# Patient Record
Sex: Male | Born: 1955 | Race: Black or African American | Hispanic: No | Marital: Single | Smoking: Current every day smoker
Health system: Southern US, Community
[De-identification: ages and names within clinical notes are randomized; demographics above are authoritative.]

## PROBLEM LIST (undated history)

## (undated) DIAGNOSIS — B182 Chronic viral hepatitis C: Secondary | ICD-10-CM

## (undated) DIAGNOSIS — F1910 Other psychoactive substance abuse, uncomplicated: Secondary | ICD-10-CM

## (undated) DIAGNOSIS — F1011 Alcohol abuse, in remission: Secondary | ICD-10-CM

## (undated) DIAGNOSIS — I10 Essential (primary) hypertension: Secondary | ICD-10-CM

## (undated) DIAGNOSIS — E785 Hyperlipidemia, unspecified: Secondary | ICD-10-CM

## (undated) DIAGNOSIS — F17200 Nicotine dependence, unspecified, uncomplicated: Secondary | ICD-10-CM

## (undated) HISTORY — DX: Nicotine dependence, unspecified, uncomplicated: F17.200

## (undated) HISTORY — DX: Hyperlipidemia, unspecified: E78.5

## (undated) HISTORY — DX: Chronic viral hepatitis C: B18.2

## (undated) HISTORY — PX: POLYPECTOMY: SHX149

## (undated) HISTORY — DX: Alcohol abuse, in remission: F10.11

## (undated) HISTORY — PX: KNEE ARTHROSCOPY: SHX127

## (undated) HISTORY — DX: Other psychoactive substance abuse, uncomplicated: F19.10

## (undated) HISTORY — PX: COLONOSCOPY: SHX174

---

## 1998-07-24 ENCOUNTER — Emergency Department (HOSPITAL_COMMUNITY): Admission: EM | Admit: 1998-07-24 | Discharge: 1998-07-24 | Payer: Self-pay

## 1998-08-07 ENCOUNTER — Encounter: Payer: Self-pay | Admitting: Emergency Medicine

## 1998-08-07 ENCOUNTER — Emergency Department (HOSPITAL_COMMUNITY): Admission: EM | Admit: 1998-08-07 | Discharge: 1998-08-07 | Payer: Self-pay | Admitting: Emergency Medicine

## 1999-12-22 ENCOUNTER — Emergency Department (HOSPITAL_COMMUNITY): Admission: EM | Admit: 1999-12-22 | Discharge: 1999-12-22 | Payer: Self-pay | Admitting: Emergency Medicine

## 1999-12-22 ENCOUNTER — Encounter: Payer: Self-pay | Admitting: Emergency Medicine

## 2001-10-27 ENCOUNTER — Encounter: Payer: Self-pay | Admitting: Emergency Medicine

## 2001-10-27 ENCOUNTER — Emergency Department (HOSPITAL_COMMUNITY): Admission: EM | Admit: 2001-10-27 | Discharge: 2001-10-27 | Payer: Self-pay | Admitting: Emergency Medicine

## 2001-12-20 ENCOUNTER — Encounter: Payer: Self-pay | Admitting: Emergency Medicine

## 2001-12-20 ENCOUNTER — Emergency Department (HOSPITAL_COMMUNITY): Admission: EM | Admit: 2001-12-20 | Discharge: 2001-12-20 | Payer: Self-pay | Admitting: Emergency Medicine

## 2001-12-24 ENCOUNTER — Emergency Department (HOSPITAL_COMMUNITY): Admission: EM | Admit: 2001-12-24 | Discharge: 2001-12-24 | Payer: Self-pay | Admitting: Emergency Medicine

## 2002-02-14 ENCOUNTER — Emergency Department (HOSPITAL_COMMUNITY): Admission: EM | Admit: 2002-02-14 | Discharge: 2002-02-14 | Payer: Self-pay

## 2002-06-01 ENCOUNTER — Emergency Department (HOSPITAL_COMMUNITY): Admission: EM | Admit: 2002-06-01 | Discharge: 2002-06-01 | Payer: Self-pay | Admitting: Emergency Medicine

## 2003-04-28 ENCOUNTER — Emergency Department (HOSPITAL_COMMUNITY): Admission: EM | Admit: 2003-04-28 | Discharge: 2003-04-28 | Payer: Self-pay | Admitting: Emergency Medicine

## 2003-05-30 ENCOUNTER — Emergency Department (HOSPITAL_COMMUNITY): Admission: EM | Admit: 2003-05-30 | Discharge: 2003-05-30 | Payer: Self-pay | Admitting: Emergency Medicine

## 2003-08-02 ENCOUNTER — Emergency Department (HOSPITAL_COMMUNITY): Admission: EM | Admit: 2003-08-02 | Discharge: 2003-08-02 | Payer: Self-pay | Admitting: *Deleted

## 2003-11-26 ENCOUNTER — Emergency Department (HOSPITAL_COMMUNITY): Admission: EM | Admit: 2003-11-26 | Discharge: 2003-11-26 | Payer: Self-pay | Admitting: Emergency Medicine

## 2004-02-16 ENCOUNTER — Emergency Department (HOSPITAL_COMMUNITY): Admission: EM | Admit: 2004-02-16 | Discharge: 2004-02-16 | Payer: Self-pay

## 2005-05-24 ENCOUNTER — Emergency Department (HOSPITAL_COMMUNITY): Admission: EM | Admit: 2005-05-24 | Discharge: 2005-05-24 | Payer: Self-pay | Admitting: Emergency Medicine

## 2006-06-17 ENCOUNTER — Emergency Department (HOSPITAL_COMMUNITY): Admission: EM | Admit: 2006-06-17 | Discharge: 2006-06-17 | Payer: Self-pay | Admitting: Emergency Medicine

## 2007-01-07 ENCOUNTER — Ambulatory Visit: Payer: Self-pay | Admitting: Family Medicine

## 2007-03-26 ENCOUNTER — Ambulatory Visit: Payer: Self-pay | Admitting: Family Medicine

## 2007-04-02 ENCOUNTER — Ambulatory Visit (HOSPITAL_BASED_OUTPATIENT_CLINIC_OR_DEPARTMENT_OTHER): Admission: RE | Admit: 2007-04-02 | Discharge: 2007-04-02 | Payer: Self-pay | Admitting: Urology

## 2007-04-02 ENCOUNTER — Encounter (INDEPENDENT_AMBULATORY_CARE_PROVIDER_SITE_OTHER): Payer: Self-pay | Admitting: Urology

## 2008-04-27 ENCOUNTER — Emergency Department (HOSPITAL_COMMUNITY): Admission: EM | Admit: 2008-04-27 | Discharge: 2008-04-27 | Payer: Self-pay | Admitting: Family Medicine

## 2008-12-13 ENCOUNTER — Emergency Department (HOSPITAL_COMMUNITY): Admission: EM | Admit: 2008-12-13 | Discharge: 2008-12-13 | Payer: Self-pay | Admitting: Family Medicine

## 2009-01-11 ENCOUNTER — Encounter (INDEPENDENT_AMBULATORY_CARE_PROVIDER_SITE_OTHER): Payer: Self-pay | Admitting: Adult Health

## 2009-01-11 ENCOUNTER — Ambulatory Visit: Payer: Self-pay | Admitting: Family Medicine

## 2009-01-11 LAB — CONVERTED CEMR LAB
AST: 49 units/L — ABNORMAL HIGH (ref 0–37)
Albumin: 4.2 g/dL (ref 3.5–5.2)
Alkaline Phosphatase: 63 units/L (ref 39–117)
BUN: 21 mg/dL (ref 6–23)
CO2: 22 meq/L (ref 19–32)
Calcium: 9.4 mg/dL (ref 8.4–10.5)
Chloride: 108 meq/L (ref 96–112)
Sodium: 142 meq/L (ref 135–145)
Total Bilirubin: 0.6 mg/dL (ref 0.3–1.2)
Total Protein: 7.8 g/dL (ref 6.0–8.3)

## 2009-03-04 ENCOUNTER — Emergency Department (HOSPITAL_COMMUNITY): Admission: EM | Admit: 2009-03-04 | Discharge: 2009-03-05 | Payer: Self-pay | Admitting: Emergency Medicine

## 2009-03-14 ENCOUNTER — Ambulatory Visit: Payer: Self-pay | Admitting: Internal Medicine

## 2009-05-16 ENCOUNTER — Encounter (INDEPENDENT_AMBULATORY_CARE_PROVIDER_SITE_OTHER): Payer: Self-pay | Admitting: Adult Health

## 2009-05-16 ENCOUNTER — Ambulatory Visit: Payer: Self-pay | Admitting: Internal Medicine

## 2009-05-16 LAB — CONVERTED CEMR LAB
ALT: 40 units/L (ref 0–53)
Alkaline Phosphatase: 60 units/L (ref 39–117)
BUN: 18 mg/dL (ref 6–23)
CO2: 23 meq/L (ref 19–32)
Calcium: 8.8 mg/dL (ref 8.4–10.5)
Chloride: 105 meq/L (ref 96–112)
Cholesterol: 187 mg/dL (ref 0–200)
Glucose, Bld: 99 mg/dL (ref 70–99)

## 2009-11-20 ENCOUNTER — Emergency Department (HOSPITAL_COMMUNITY): Admission: EM | Admit: 2009-11-20 | Discharge: 2009-11-20 | Payer: Self-pay | Admitting: Emergency Medicine

## 2010-11-20 ENCOUNTER — Encounter (INDEPENDENT_AMBULATORY_CARE_PROVIDER_SITE_OTHER): Payer: Self-pay | Admitting: *Deleted

## 2010-11-20 LAB — CONVERTED CEMR LAB
ALT: 32 units/L (ref 0–53)
AST: 29 units/L (ref 0–37)
Alkaline Phosphatase: 53 units/L (ref 39–117)
Chloride: 104 meq/L (ref 96–112)
Cholesterol: 199 mg/dL (ref 0–200)
Glucose, Bld: 96 mg/dL (ref 70–99)
Sodium: 138 meq/L (ref 135–145)
Total CHOL/HDL Ratio: 2
Total Protein: 7.3 g/dL (ref 6.0–8.3)

## 2011-01-01 ENCOUNTER — Inpatient Hospital Stay (INDEPENDENT_AMBULATORY_CARE_PROVIDER_SITE_OTHER)
Admission: RE | Admit: 2011-01-01 | Discharge: 2011-01-01 | Disposition: A | Discharge status: Home / Self Care | Payer: Medicaid Other | Source: Ambulatory Visit | Attending: Family Medicine | Admitting: Family Medicine

## 2011-01-01 DIAGNOSIS — S335XXA Sprain of ligaments of lumbar spine, initial encounter: Secondary | ICD-10-CM

## 2011-01-11 ENCOUNTER — Inpatient Hospital Stay (INDEPENDENT_AMBULATORY_CARE_PROVIDER_SITE_OTHER)
Admission: RE | Admit: 2011-01-11 | Discharge: 2011-01-11 | Disposition: A | Discharge status: Home / Self Care | Payer: Medicaid Other | Source: Ambulatory Visit | Attending: Family Medicine | Admitting: Family Medicine

## 2011-01-11 DIAGNOSIS — K5900 Constipation, unspecified: Secondary | ICD-10-CM

## 2011-01-11 DIAGNOSIS — K649 Unspecified hemorrhoids: Secondary | ICD-10-CM

## 2011-02-06 NOTE — Op Note (Signed)
NAMEMELROY, BOUGHER                ACCOUNT NO.:  [id number]   MEDICAL RECORD NO.:  [id number]          PATIENT TYPE:  AMB   LOCATION:  [facility]                         FACILITY:  [facility]   PHYSICIAN:  Ronald L. Earlene Plater, M.D.  DATE OF BIRTH:  1956/05/27   DATE OF PROCEDURE:  04/02/2007  DATE OF DISCHARGE:                               OPERATIVE REPORT   DIAGNOSIS:  Balanitis.   OPERATIVE PROCEDURE:  Circumcision.   SURGEON:  Gaynelle Arabian, MD   ANESTHESIA:  LMA.   BLOOD LOSS:  10 mL.   TUBES:  None.   COMPLICATIONS:  None.   INDICATION FOR PROCEDURE:  Roy Martinez is a very nice 55 year old black  male who presents with intermittent balanitis.  He notes at times it is  highly irritating and wishes circumcision.  After understanding risks,  benefits and alternatives, he has elected to proceed.   PROCEDURE IN DETAIL:  The patient was placed in the supine position.  After proper LMA anesthesia, he was prepped with Betadine and draped in  a sterile fashion.  A circumferential incision was made in the shaft  skin at the appropriate level and extended down to Buck's fascia and the  corpus spongiosum and a similar incision was made approximately 2 mm  proximal to the corona __________ and extended to the same levels.  A  dorsal slit was then performed and the tissues were carefully dissected,  utilizing both blunt and sharp dissection and Bovie coagulation cautery  to coagulate the bleeders and the foreskin was submitted for  identification only to Pathology.  Good hemostasis was obtained.  The  area was irrigated and the shaft skin was approximated to the mucosa.  A  U-type stitch was placed with 3-0 chromic catgut in the frenulum area  and a dorsal stitch was placed and running sutures were then performed  between these two.  Good hemostasis was noted to be present in the  wound.  The wound was dressed with Vaseline gauze, 4 x 4 sponge and a  Coban.  The patient tolerated the procedure  well and was taken to the  recovery room in stable.      Ronald L. Earlene Plater, M.D.  Electronically Signed     RLD/MEDQ  D:  04/02/2007  T:  04/03/2007  Job:  [id number]

## 2011-02-09 NOTE — Op Note (Signed)
[facility]  Patient:    Roy Martinez, Roy Martinez Visit Number: [id number] MRN: [id number]          Service Type: EXP Location: ED Attending Physician:  Hanley Seamen Dictated by:   Dominica Severin III, M.D. Proc. Date: 10/27/01 Admit Date:  10/27/2001 Discharge Date: 10/27/2001                             Operative Report  INDICATIONS FOR PROCEDURE:  Valdez Brannan presents today for evaluation in the [facility] in regards to his left thumb. The patient sustained an injury with a wood splitter and sustained an injury to his nail with subungual hematoma and distal P3 fracture about the left thumb. I was asked to see him by the emergency room staff. Tetanus status is up to date. He has been given a gram of Ancef. He denies other injuries.  PAST MEDICAL HISTORY:  None.  PAST SURGICAL HISTORY:  None.  MEDICATIONS:  None.  ALLERGIES:  The patient is allergic to PENICILLIN. He has tolerated Ancef in the emergency room today quite well.  SOCIAL HISTORY:  The patient denies any excessive alcohol use.  PHYSICAL EXAMINATION:  GENERAL:  Black male alert and oriented in no acute distress.  VITAL SIGNS:  Vital signs are stable. He is afebrile.  EXTREMITIES:  He has a subungual hematoma nail laceration and distal P3 fracture noted per his x-ray. EPL and FPL are difficult to evaluate due to pain. He has had additional block which is somewhat incomplete thus his physical examination is difficult. There are no signs of obvious infection in the remaining fingers or other skin changes. X-rays shows a distal phalanx fracture about the left thumb.  IMPRESSION:  Open distal phalanx fracture, left thumb.  PLAN:  I verbally consented for I&D and repair of structures as necessary.  DESCRIPTION OF PROCEDURE:  The patient was given a lidocaine block without epinephrine. He tolerated this well. Following this, the patient underwent thorough scrub and paint  with Hibiclens and alcohol. Following this, the patient had a tourniquet applied, nail was elevated and removed. The patient then underwent I&D of the bone. This included the skin and subcutaneous tissue, bone and nail bed tissue. Adequate I&D was accomplished without difficulty and the patient tolerated this well. Once this was done, the patient then underwent reapproximation of the bone. This was done to my satisfaction without difficulty. After the copious irrigation, I did let the tourniquet down for this to allow for egress of blood. Following this and excellent irrigation, the patient then underwent a 6-0 chromic repair of the nail bed. He tolerated this well without difficulty under 4.0 loop magnification. Once this was done, eponychial gauze was placed under the nail fold. Xeroform was placed, excellent refill was noted, and the patient had a sterile dressing applied about the thumb. A splint was placed. He tolerated this well without difficulty. All sponge, needle and instrument counts were reported as correct and there were no immediate intraoperative complications.  The patient will continue elevation. He was discharged home on Keflex and Vicodin and will return to the office and see [place] in 7 days for a wound check. All questions have been encouraged and answered. Dictated by:   Dominica Severin III, M.D. Attending Physician:  Hanley Seamen DD:  10/27/01 TD:  10/28/01 Job: [id number]

## 2011-02-25 ENCOUNTER — Emergency Department (HOSPITAL_COMMUNITY)
Admission: EM | Admit: 2011-02-25 | Discharge: 2011-02-26 | Disposition: A | Discharge status: Home / Self Care | Payer: Medicare Other | Attending: Emergency Medicine | Admitting: Emergency Medicine

## 2011-02-25 ENCOUNTER — Emergency Department (HOSPITAL_COMMUNITY): Payer: Medicare Other

## 2011-02-25 DIAGNOSIS — Y93H9 Activity, other involving exterior property and land maintenance, building and construction: Secondary | ICD-10-CM | POA: Insufficient documentation

## 2011-02-25 DIAGNOSIS — Z23 Encounter for immunization: Secondary | ICD-10-CM | POA: Insufficient documentation

## 2011-02-25 DIAGNOSIS — S92919B Unspecified fracture of unspecified toe(s), initial encounter for open fracture: Secondary | ICD-10-CM | POA: Insufficient documentation

## 2011-02-25 DIAGNOSIS — W309XXA Contact with unspecified agricultural machinery, initial encounter: Secondary | ICD-10-CM | POA: Insufficient documentation

## 2011-07-10 LAB — POCT HEMOGLOBIN-HEMACUE: Hemoglobin: 15.3

## 2011-08-14 ENCOUNTER — Emergency Department (HOSPITAL_COMMUNITY)
Admission: EM | Admit: 2011-08-14 | Discharge: 2011-08-14 | Disposition: A | Discharge status: Home / Self Care | Payer: Medicare Other | Attending: Emergency Medicine | Admitting: Emergency Medicine

## 2011-08-14 DIAGNOSIS — IMO0002 Reserved for concepts with insufficient information to code with codable children: Secondary | ICD-10-CM | POA: Insufficient documentation

## 2011-08-14 DIAGNOSIS — F17200 Nicotine dependence, unspecified, uncomplicated: Secondary | ICD-10-CM | POA: Insufficient documentation

## 2011-08-14 DIAGNOSIS — M549 Dorsalgia, unspecified: Secondary | ICD-10-CM | POA: Insufficient documentation

## 2011-08-14 DIAGNOSIS — X58XXXA Exposure to other specified factors, initial encounter: Secondary | ICD-10-CM | POA: Insufficient documentation

## 2011-08-14 DIAGNOSIS — S39012A Strain of muscle, fascia and tendon of lower back, initial encounter: Secondary | ICD-10-CM

## 2011-08-14 MED ORDER — IBUPROFEN 800 MG PO TABS: 800.0000 mg | ORAL_TABLET | Freq: Three times a day (TID) | ORAL | Status: AC

## 2011-08-14 MED ORDER — OXYCODONE-ACETAMINOPHEN 5-325 MG PO TABS: 1.0000 | ORAL_TABLET | Freq: Four times a day (QID) | ORAL | Status: AC | PRN

## 2011-08-14 MED ORDER — KETOROLAC TROMETHAMINE 60 MG/2ML IM SOLN
60.0000 mg | Freq: Once | INTRAMUSCULAR | Status: AC
  Administered 2011-08-14: 60 mg via INTRAMUSCULAR
  Filled 2011-08-14: qty 2

## 2011-08-14 NOTE — ED Provider Notes (Signed)
History     CSN: [id number] Arrival date & time: 08/14/2011  7:10 PM   First MD Initiated Contact with Patient 08/14/11 2028      Chief Complaint  Patient presents with  . Back Pain    (Consider location/radiation/quality/duration/timing/severity/associated sxs/prior treatment) The history is provided by the patient.    Pt states that he slept on a big ball of his comforter last night and woke up with a sore back, then he was washing his car this evening and when he bent down then stood up, his back hurt even worse. He states it is almost gone when he lies still, and worsens with moving and standing still. He does not have a history of back problems. Pt is able to ambulate. No V/D/F or chills  History reviewed. No pertinent past medical history.  Past Surgical History  Procedure Date  . Knee arthroscopy     No family history on file.  History  Substance Use Topics  . Smoking status: Current Everyday Smoker -- 1.0 packs/day  . Smokeless tobacco: Not on file  . Alcohol Use: Yes      Review of Systems  All other systems reviewed and are negative.    Allergies  Penicillins  Home Medications   Current Outpatient Rx  Name Route Sig Dispense Refill  . IBUPROFEN 200 MG PO TABS Oral Take 400 mg by mouth daily as needed.        BP 145/86  Pulse 59  Temp(Src) 98.6 F (37 C) (Oral)  Resp 20  SpO2 99%  Physical Exam  Nursing note and vitals reviewed. Constitutional: He is oriented to person, place, and time. He appears well-developed and well-nourished.  HENT:  Head: Normocephalic and atraumatic.  Eyes: EOM are normal. Pupils are equal, round, and reactive to light.  Neck: Normal range of motion.  Cardiovascular: Normal rate and regular rhythm.   Pulmonary/Chest: Effort normal and breath sounds normal.  Musculoskeletal: Normal range of motion.       Arms: Neurological: He is alert and oriented to person, place, and time.  Skin: Skin is warm and dry.    ED  Course  Procedures (including critical care time)   Labs Reviewed  URINALYSIS, ROUTINE W REFLEX MICROSCOPIC   No results found.   No diagnosis found.    MDM          Dorthula Matas, PA 08/14/11 2037

## 2011-08-14 NOTE — ED Notes (Signed)
Pt presents with sudden onset of L sided back pain after awakening this morning.  Pt was washing his car with pain worsening.  Pt describes "like something running around in my back".  Pt denies any shortness of breath.  Pt reports pain worsens with movement.

## 2011-08-14 NOTE — ED Notes (Signed)
Pt is going to the bathroom to void

## 2011-08-15 NOTE — ED Provider Notes (Signed)
Medical screening examination/treatment/procedure(s) were performed by non-physician practitioner and as supervising physician I was immediately available for consultation/collaboration.    Nelia Shi, MD 08/15/11 [phone]

## 2011-10-05 ENCOUNTER — Emergency Department (HOSPITAL_COMMUNITY)
Admission: EM | Admit: 2011-10-05 | Discharge: 2011-10-05 | Disposition: A | Discharge status: Home / Self Care | Payer: Medicare Other | Attending: Emergency Medicine | Admitting: Emergency Medicine

## 2011-10-05 ENCOUNTER — Encounter (HOSPITAL_COMMUNITY): Payer: Self-pay

## 2011-10-05 DIAGNOSIS — F17200 Nicotine dependence, unspecified, uncomplicated: Secondary | ICD-10-CM | POA: Diagnosis not present

## 2011-10-05 DIAGNOSIS — M546 Pain in thoracic spine: Secondary | ICD-10-CM | POA: Diagnosis not present

## 2011-10-05 DIAGNOSIS — M549 Dorsalgia, unspecified: Secondary | ICD-10-CM | POA: Insufficient documentation

## 2011-10-05 MED ORDER — HYDROCODONE-ACETAMINOPHEN 5-325 MG PO TABS: ORAL_TABLET | ORAL | Status: AC

## 2011-10-05 MED ORDER — METHOCARBAMOL 500 MG PO TABS: 1000.0000 mg | ORAL_TABLET | Freq: Four times a day (QID) | ORAL | Status: AC

## 2011-10-05 MED ORDER — NAPROXEN 500 MG PO TABS: 500.0000 mg | ORAL_TABLET | Freq: Two times a day (BID) | ORAL | Status: DC

## 2011-10-05 NOTE — ED Notes (Signed)
Patent reports that he was doing yard work 2 days ago and feels like he pulled something in his back. Patient c/o upper back pain between shoulder blades.

## 2011-10-05 NOTE — ED Provider Notes (Signed)
History/physical exam/procedure(s) were performed by non-physician practitioner and as supervising physician I was immediately available for consultation/collaboration. I have reviewed all notes and am in agreement with care and plan.   Hilario Quarry, MD 10/05/11 [phone]

## 2011-10-05 NOTE — ED Provider Notes (Signed)
History     CSN: [id number]  Arrival date & time 10/05/11  9892   First MD Initiated Contact with Patient 10/05/11 1018      Chief Complaint  Patient presents with  . Back Pain    (Consider location/radiation/quality/duration/timing/severity/associated sxs/prior treatment) HPI Comments: Patient presents to ED complaining of mid back pain.  The patient does not seem to remember a specific mechanism of injury but states he thinks he hurt it 2 days ago while moving a pressure washer.  Patient reports 8/10 pn scale.  Denies chest pain, neck pain, low back pain, numbness, tingling, and weakness.  Prev hx of LBP but has never injured thoracic spine/shoulders/neck. No red flag s/s of back pain.   Patient is a 56 y.o. male presenting with back pain. The history is provided by the patient.  Back Pain  This is a new problem. The current episode started 2 days ago. The problem occurs constantly. The problem has not changed since onset.The pain is associated with no known injury. The pain is present in the thoracic spine. The quality of the pain is described as aching. The pain does not radiate. The patient is experiencing no pain. Pertinent negatives include no chest pain, no fever, no numbness, no headaches, no bowel incontinence, no bladder incontinence, no paresthesias and no weakness. He has tried nothing for the symptoms.    History reviewed. No pertinent past medical history.  Past Surgical History  Procedure Date  . Knee arthroscopy     Family History  Problem Relation Age of Onset  . Heart failure Mother   . Heart failure Father     History  Substance Use Topics  . Smoking status: Current Everyday Smoker -- 0.5 packs/day  . Smokeless tobacco: Not on file  . Alcohol Use: No      Review of Systems  Constitutional: Negative for fever, fatigue and unexpected weight change.  HENT: Negative for neck pain and neck stiffness.   Cardiovascular: Negative for chest pain, palpitations  and leg swelling.  Gastrointestinal: Negative for nausea, vomiting, constipation and bowel incontinence.       Neg for fecal incontinence  Genitourinary: Negative for bladder incontinence, flank pain and difficulty urinating.  Musculoskeletal: Positive for back pain.  Skin: Negative for color change.  Neurological: Negative for syncope, weakness, numbness, headaches and paresthesias.       Neg for numbness, tingling, or weakness in extremities. Neg for saddle paresthesias.     Allergies  Penicillins  Home Medications  No current outpatient prescriptions on file.  BP 124/79  Pulse 57  Temp(Src) 97.7 F (36.5 C) (Oral)  Resp 18  SpO2 99%  Physical Exam  Nursing note and vitals reviewed. Constitutional: He is oriented to person, place, and time. He appears well-developed and well-nourished.  HENT:  Head: Normocephalic and atraumatic.  Eyes: Conjunctivae are normal.  Neck: Normal range of motion.  Cardiovascular: Normal rate, normal heart sounds and intact distal pulses.   Pulmonary/Chest: Effort normal and breath sounds normal.  Abdominal: Soft. There is no tenderness.  Musculoskeletal: Normal range of motion. He exhibits no tenderness.       Arms:      Neck ROM is limited with R lateral flexion. Thoracic paraspinal tenderness.  Full shoulder ROM with mild pain with shoulder extension.  Shoulder strength is 5/5.  No step off with palpation of spine.   Neurological: He is alert and oriented to person, place, and time. He has normal reflexes. He exhibits normal muscle  tone.       5/5 strength in entire lower extremities bilaterally. No sensation deficit.   Skin: Skin is warm and dry.  Psychiatric: He has a normal mood and affect.    ED Course  Procedures (including critical care time)  Labs Reviewed - No data to display No results found.   1. Back pain    11:39 AM Patient seen and examined.  Patient was counseled on back pain precautions and told to do activity as  tolerated but do not lift, push, or pull heavy objects more than 10 pounds.  Patient counseled to use ice or heat on back for no longer than 15 minutes every hour.  Questions answered.  Patient verbalized understanding.    11:39 AM Patient counseled on use of narcotic pain medications. Counseled not to combine these medications with others containing tylenol. Urged not to drink alcohol, drive, or perform any other activities that requires focus while taking these medications. The patient verbalizes understanding and agrees with the plan.  11:39 AM Patient counseled on proper use of muscle relaxant medication.  They were told not to drink alcohol, drive any vehicle, or do any dangerous activities while taking this medication.  Patient verbalized understanding.    MDM  Patient with back pain, 3 days, possible injury.  No neurological deficits and normal neuro exam. No loss of bowel or bladder control.  No concern for cauda equina.  No fever, night sweats, weight loss, h/o cancer, IVDU.  RICE protocol and pain medicine indicated.          Carolee Rota, [place] 10/05/11 1139

## 2012-04-03 ENCOUNTER — Encounter (HOSPITAL_COMMUNITY): Payer: Self-pay | Admitting: *Deleted

## 2012-04-03 ENCOUNTER — Emergency Department (HOSPITAL_COMMUNITY)
Admission: EM | Admit: 2012-04-03 | Discharge: 2012-04-03 | Disposition: A | Discharge status: Home / Self Care | Payer: Medicare Other | Attending: Emergency Medicine | Admitting: Emergency Medicine

## 2012-04-03 DIAGNOSIS — D179 Benign lipomatous neoplasm, unspecified: Secondary | ICD-10-CM | POA: Diagnosis not present

## 2012-04-03 DIAGNOSIS — S39012A Strain of muscle, fascia and tendon of lower back, initial encounter: Secondary | ICD-10-CM

## 2012-04-03 DIAGNOSIS — S335XXA Sprain of ligaments of lumbar spine, initial encounter: Secondary | ICD-10-CM | POA: Diagnosis not present

## 2012-04-03 DIAGNOSIS — X58XXXA Exposure to other specified factors, initial encounter: Secondary | ICD-10-CM | POA: Insufficient documentation

## 2012-04-03 MED ORDER — HYDROCODONE-ACETAMINOPHEN 5-500 MG PO TABS: 1.0000 | ORAL_TABLET | Freq: Four times a day (QID) | ORAL | Status: AC | PRN

## 2012-04-03 MED ORDER — NAPROXEN 500 MG PO TABS: 500.0000 mg | ORAL_TABLET | Freq: Two times a day (BID) | ORAL | Status: DC

## 2012-04-03 MED ORDER — CYCLOBENZAPRINE HCL 10 MG PO TABS: 10.0000 mg | ORAL_TABLET | Freq: Two times a day (BID) | ORAL | Status: AC | PRN

## 2012-04-03 NOTE — ED Provider Notes (Signed)
Medical screening examination/treatment/procedure(s) were performed by non-physician practitioner and as supervising physician I was immediately available for consultation/collaboration.  Shron Ozer K Linker, MD 04/03/12 1611 

## 2012-04-03 NOTE — ED Provider Notes (Signed)
History     CSN: [id number]  Arrival date & time 04/03/12  1139   First MD Initiated Contact with Patient 04/03/12 1216      Chief Complaint  Patient presents with  . Back Pain    (Consider location/radiation/quality/duration/timing/severity/associated sxs/prior treatment) Patient is a 56 y.o. male presenting with back pain. The history is provided by the patient.  Back Pain  This is a recurrent problem. The current episode started yesterday. Pertinent negatives include no fever, no headaches and no abdominal pain.  Pt states he lifted a pressure washer yesterday. States since then pain to the left lower back. No midline pain. No radiation down legs. No weakness or numbness in legs. No abdominal pain. No fever. No urinary symptoms or hematuria. No problems with bowels. States took ibuprofen with no relief. States similar symptoms in the past. Pt also reports that he has "bump" on his forehead that has been there several months but is getting larger. No pain or redness. No injury to the head.  History reviewed. No pertinent past medical history.  Past Surgical History  Procedure Date  . Knee arthroscopy     Family History  Problem Relation Age of Onset  . Heart failure Mother   . Heart failure Father     History  Substance Use Topics  . Smoking status: Current Everyday Smoker -- 0.5 packs/day  . Smokeless tobacco: Not on file  . Alcohol Use: Yes     2 quarts beer everyday      Review of Systems  Constitutional: Negative for fever and chills.  HENT: Negative for neck pain and neck stiffness.   Respiratory: Negative.   Cardiovascular: Negative.   Gastrointestinal: Negative for nausea, vomiting, abdominal pain, diarrhea and constipation.  Genitourinary: Negative for urgency, frequency, hematuria, flank pain, difficulty urinating and testicular pain.  Musculoskeletal: Positive for back pain. Negative for gait problem.  Skin: Positive for wound.  Neurological: Negative  for dizziness and headaches.    Allergies  Penicillins  Home Medications  No current outpatient prescriptions on file.  BP 129/82  Pulse 56  Temp 97.9 F (36.6 C) (Oral)  Resp 14  SpO2 99%  Physical Exam  Nursing note and vitals reviewed. Constitutional: He is oriented to person, place, and time. He appears well-developed and well-nourished. No distress.  HENT:       Cystic, soft, about 2cm mass to the right forehead, and another one to the left temple, slightly larger, about 4cm. Non tender. Non erythemous.   Eyes: Conjunctivae are normal.  Neck: Neck supple.  Cardiovascular: Normal rate, regular rhythm and normal heart sounds.   Pulmonary/Chest: Effort normal and breath sounds normal. No respiratory distress. He has no wheezes. He has no rales.  Abdominal: Soft. Bowel sounds are normal. He exhibits no distension. There is no tenderness. There is no rebound.  Musculoskeletal:       No midline spine tenderness. No tenderness over lumbar area. Pain with forward flexion in the left lower back. No pain with straight leg raise  Neurological: He is alert and oriented to person, place, and time.       5/5 and equal strength of LE bilaterally. Normal sensation over thighs and lowe legs. Pt able to dorsiflex bilateral feet and toes. Pt's left knee deformed from prior surgeries, no patellar reflex attempted. Normal achillis reflexes.  Skin: Skin is warm and dry.  Psychiatric: He has a normal mood and affect.    ED Course  Procedures (including critical care  time)  Pt with musculoskeletal back pain, after lifitng heavy pressure washer. He is neurovascularly intact. No concern for caudea equina, no red flags. No pain radiation, no weakness of lower extremities. No abdominal pain. Afebrile. Will d/c home with pain medications and follow up with orthopedics.   1. Lumbar strain   2. Lipoma       MDM         Lottie Mussel, PA 04/03/12 1556

## 2012-04-03 NOTE — ED Notes (Signed)
Pt reports low back pain since last night. Sts "could be" when asked if pain is related to lifting or straining back. Ibuprofen without relief at home. Also reports a "knot" on forehead that has been growing since last week. Not painful, denies redness/drainage.

## 2012-06-11 ENCOUNTER — Emergency Department (HOSPITAL_COMMUNITY)
Admission: EM | Admit: 2012-06-11 | Discharge: 2012-06-11 | Disposition: A | Discharge status: Home / Self Care | Payer: Medicare Other | Attending: Emergency Medicine | Admitting: Emergency Medicine

## 2012-06-11 ENCOUNTER — Emergency Department (HOSPITAL_COMMUNITY): Payer: Medicare Other

## 2012-06-11 ENCOUNTER — Encounter (HOSPITAL_COMMUNITY): Payer: Self-pay | Admitting: Emergency Medicine

## 2012-06-11 DIAGNOSIS — Z8249 Family history of ischemic heart disease and other diseases of the circulatory system: Secondary | ICD-10-CM | POA: Insufficient documentation

## 2012-06-11 DIAGNOSIS — M47812 Spondylosis without myelopathy or radiculopathy, cervical region: Secondary | ICD-10-CM | POA: Diagnosis not present

## 2012-06-11 DIAGNOSIS — F17200 Nicotine dependence, unspecified, uncomplicated: Secondary | ICD-10-CM | POA: Insufficient documentation

## 2012-06-11 DIAGNOSIS — Z880 Allergy status to penicillin: Secondary | ICD-10-CM | POA: Insufficient documentation

## 2012-06-11 MED ORDER — OXYCODONE-ACETAMINOPHEN 5-325 MG PO TABS: 2.0000 | ORAL_TABLET | ORAL | Status: DC | PRN

## 2012-06-11 MED ORDER — KETOROLAC TROMETHAMINE 60 MG/2ML IM SOLN
60.0000 mg | Freq: Once | INTRAMUSCULAR | Status: AC
  Administered 2012-06-11: 60 mg via INTRAMUSCULAR
  Filled 2012-06-11: qty 2

## 2012-06-11 NOTE — ED Notes (Signed)
Patient transported to X-ray 

## 2012-06-11 NOTE — ED Provider Notes (Signed)
History     CSN: [id number]  Arrival date & time 06/11/12  1358   First MD Initiated Contact with Patient 06/11/12 1658      Chief Complaint  Patient presents with  . 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 Pain feels like stiffness and does not radiate. Gradual onset. No medication for pain relief at home. Limits ROM of neck. Patient denies NVD, fever, visual changes, headache, numbness/tinlging, bowel/bladder incontinence.   Patient is a 56 y.o. male presenting with neck pain.  Neck Pain     History reviewed. No pertinent past medical history.  Past Surgical History  Procedure Date  . Knee arthroscopy     Family History  Problem Relation Age of Onset  . Heart failure Mother   . Heart failure Father     History  Substance Use Topics  . Smoking status: Current Every Day Smoker -- 0.5 packs/day    Types: Cigarettes  . Smokeless tobacco: Not on file  . Alcohol Use: Yes     2 quarts beer everyday      Review of Systems  HENT: Positive for neck pain.   All other systems reviewed and are negative.    Allergies  Penicillins  Home Medications  No current outpatient prescriptions on file.  BP 143/80  Pulse 59  Temp 98.6 F (37 C) (Oral)  Resp 20  SpO2 98%  Physical Exam  Nursing note and vitals reviewed. Constitutional: He is oriented to person, place, and time. He appears well-developed and well-nourished. No distress.  HENT:  Head: Normocephalic and atraumatic.  Mouth/Throat: No oropharyngeal exudate.  Eyes: Conjunctivae normal and EOM are normal. No scleral icterus.  Neck:       Neck stiff and limited ROM due to pain. No obvious deformity.   Cardiovascular: Normal rate and regular rhythm.  Exam reveals no gallop and no friction rub.   No murmur heard. Pulmonary/Chest:  Effort normal and breath sounds normal. No respiratory distress. He has no wheezes. He has no rales. He exhibits no tenderness.  Abdominal: Soft. There is no tenderness.  Musculoskeletal: Normal range of motion.       Neck tender to palpation laterally of spine. Neck ROM limited due to pain.   Neurological: He is alert and oriented to person, place, and time. No cranial nerve deficit. Coordination normal.       Strength and sensation equal and intact bilaterally. No meningeal signs. Normal gait. No midline spinal tenderness or step off.   Skin: Skin is warm and dry. He is not diaphoretic.  Psychiatric: He has a normal mood and affect. His behavior is normal.    ED Course  Procedures (including critical care time)  Labs Reviewed - No data to display Dg Cervical Spine Complete  06/11/2012  *RADIOLOGY REPORT*  Clinical Data: Diffuse neck pain over the last day.  CERVICAL SPINE - COMPLETE 4+ VIEW  Comparison: Cervical spine radiographs 03/04/2009.  Findings: The cervical spine is visualized from skull base through C7 on the lateral view.  Alignment is grossly anatomic at the cervicothoracic junction.  Bone detail somewhat limited by overlying structures.  Uncovertebral disease is evident bilaterally, most prominent os at to C5-6 and C6-7.  No acute fracture or traumatic subluxation is present.  The mild osseous foraminal narrowing is present on the left at to C7-T1 and on the right at C5-6.  The  lung apices are clear.  IMPRESSION:  1.  Stable cervical spondylosis. 2.  No acute fracture or traumatic subluxation.   Original Report Authenticated By: Jamesetta Orleans. MATTERN, M.D.      1. Spondylosis of cervical joint       MDM  5:10 PM Patient's xray shows stable cervical spondylosis which could be a source of his neck pain along with some musculoskeletal aspect as well. I will discharge the patient with Percocet and a referral to a Spine Surgeon. Patient denies concerning neurologic symptoms such  as loss of bowel or bladder control and saddle paresthesias.        Emilia Beck, PA-C 06/22/12 2225

## 2012-06-11 NOTE — ED Notes (Addendum)
Pt presenting to ed with c/o neck pain with stiffness pt states onset yesterday s/p washing cars. Pt denies nausea and vomiting at this time. Pt states it hurts to turn his neck from side to side. Pt denies headache pain at this time. Pt denies chest pain at this time. 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

## 2012-06-19 NOTE — ED Provider Notes (Signed)
Medical screening examination/treatment/procedure(s) were performed by non-physician practitioner and as supervising physician I was immediately available for consultation/collaboration.  Toy Baker, MD 06/19/12 [phone]

## 2012-06-23 NOTE — ED Provider Notes (Signed)
Medical screening examination/treatment/procedure(s) were performed by non-physician practitioner and as supervising physician I was immediately available for consultation/collaboration.  Lawanna Cecere T Jasmyn Picha, MD 06/23/12 1535 

## 2012-07-24 ENCOUNTER — Encounter (HOSPITAL_COMMUNITY): Payer: Self-pay | Admitting: *Deleted

## 2012-07-24 ENCOUNTER — Emergency Department (HOSPITAL_COMMUNITY)
Admission: EM | Admit: 2012-07-24 | Discharge: 2012-07-24 | Disposition: A | Discharge status: Home / Self Care | Payer: Medicare Other | Attending: Emergency Medicine | Admitting: Emergency Medicine

## 2012-07-24 DIAGNOSIS — Z23 Encounter for immunization: Secondary | ICD-10-CM | POA: Insufficient documentation

## 2012-07-24 DIAGNOSIS — Y929 Unspecified place or not applicable: Secondary | ICD-10-CM | POA: Insufficient documentation

## 2012-07-24 DIAGNOSIS — Y939 Activity, unspecified: Secondary | ICD-10-CM | POA: Insufficient documentation

## 2012-07-24 DIAGNOSIS — W540XXA Bitten by dog, initial encounter: Secondary | ICD-10-CM | POA: Insufficient documentation

## 2012-07-24 DIAGNOSIS — IMO0002 Reserved for concepts with insufficient information to code with codable children: Secondary | ICD-10-CM | POA: Insufficient documentation

## 2012-07-24 MED ORDER — SULFAMETHOXAZOLE-TRIMETHOPRIM 800-160 MG PO TABS: 1.0000 | ORAL_TABLET | Freq: Two times a day (BID) | ORAL | Status: DC

## 2012-07-24 MED ORDER — TETANUS-DIPHTH-ACELL PERTUSSIS 5-2.5-18.5 LF-MCG/0.5 IM SUSP
0.5000 mL | Freq: Once | INTRAMUSCULAR | Status: AC
  Administered 2012-07-24: 0.5 mL via INTRAMUSCULAR
  Filled 2012-07-24: qty 0.5

## 2012-07-24 NOTE — ED Notes (Signed)
Pt reports being bit by a dog yesterday on back of left upper thigh. Pain at site 6/10. Pt reports owner of dog said dog had rabies shot. Pt unsure of last tetanus shot.

## 2012-07-24 NOTE — ED Provider Notes (Addendum)
History     CSN: [id number]  Arrival date & time 07/24/12  1505   First MD Initiated Contact with Patient 07/24/12 1654      Chief Complaint  Patient presents with  . Animal Bite    (Consider location/radiation/quality/duration/timing/severity/associated sxs/prior treatment) HPI  Pt presents to the ER 24 hours after a dog bite. He was wearing pants when he was but on his left upper thigh. He denies having a lot of pain, he denies it bleeding a lot. The dogs owner says he has had his rabies shot but was unable to provider proof of the shots. He does not know if he has had a tetanus shot. He says that he put alcohol on it yesterday to clean it out and has kept it clean. No systemic symptoms of fevers, chills nausea, vomiting, diarrhea, headaches, weakness. nad vss  History reviewed. No pertinent past medical history.  Past Surgical History  Procedure Date  . Knee arthroscopy     Family History  Problem Relation Age of Onset  . Heart failure Mother   . Heart failure Father     History  Substance Use Topics  . Smoking status: Current Every Day Smoker -- 0.5 packs/day    Types: Cigarettes  . Smokeless tobacco: Not on file  . Alcohol Use: Yes     2 quarts beer everyday      Review of Systems  Review of Systems  Gen: no weight loss, fevers, chills, night sweats  Eyes: no discharge or drainage, no occular pain or visual changes  Nose: no epistaxis or rhinorrhea  Mouth: no dental pain, no sore throat  Neck: no neck pain  Lungs:No wheezing, coughing or hemoptysis CV: no chest pain, palpitations, dependent edema or orthopnea  Abd: no abdominal pain, nausea, vomiting  GU: no dysuria or gross hematuria  MSK:  Left thigh dog bite Neuro: no headache, no focal neurologic deficits  Skin: no abnormalities Psyche: negative.   Allergies  Penicillins  Home Medications   Current Outpatient Rx  Name Route Sig Dispense Refill  . SULFAMETHOXAZOLE-TRIMETHOPRIM 800-160 MG PO  TABS Oral Take 1 tablet by mouth every 12 (twelve) hours. 10 tablet 0    BP 113/71  Pulse 59  Temp 98.3 F (36.8 C) (Oral)  Resp 16  SpO2 97%  Physical Exam  Nursing note and vitals reviewed. Constitutional: He appears well-developed and well-nourished. No distress.  HENT:  Head: Normocephalic and atraumatic.  Eyes: Pupils are equal, round, and reactive to light.  Neck: Normal range of motion. Neck supple.  Cardiovascular: Normal rate and regular rhythm.   Pulmonary/Chest: Effort normal.  Abdominal: Soft.  Musculoskeletal:       Legs:      Abrasions to left thigh. No puncture wounds. Wound looks clean and dry, their is associated induration without evidence of abscess or frank cellulitis. Minimal pain with palpation.  Neurological: He is alert.  Skin: Skin is warm and dry.    ED Course  Procedures (including critical care time)  Labs Reviewed - No data to display No results found.   1. Dog bite       MDM  Pts wound does not look grossly infected but it is indurated. Since it has started healing and was not irrigated within 3 hours I will start on Bactrim to prevent infection. The patient is healthy. He has been given strict return to ED precautions and advised to use warm soap and water on area. The nurse faxed in animal  control paper work to have it verified if dog has rabies or not before injections initiated.  Pt has been advised of the symptoms that warrant their return to the ED. Patient has voiced understanding and has agreed to follow-up with the PCP or specialist.         Dorthula Matas, PA 07/24/12 1743  Dorthula Matas, PA 07/25/12 [phone]

## 2012-07-24 NOTE — ED Notes (Signed)
Pt alert and oriented x4. Respirations even and unlabored, bilateral symmetrical rise and fall of chest. Skin warm and dry. In no acute distress. Denies needs.   

## 2012-07-25 NOTE — ED Provider Notes (Signed)
Medical screening examination/treatment/procedure(s) were performed by non-physician practitioner and as supervising physician I was immediately available for consultation/collaboration.   Lyanne Co, MD 07/25/12 [phone]

## 2012-07-25 NOTE — ED Provider Notes (Signed)
Medical screening examination/treatment/procedure(s) were performed by non-physician practitioner and as supervising physician I was immediately available for consultation/collaboration.   Lyanne Co, MD 07/25/12 [phone]

## 2012-12-04 ENCOUNTER — Emergency Department (HOSPITAL_COMMUNITY): Payer: Medicare Other

## 2012-12-04 ENCOUNTER — Encounter (HOSPITAL_COMMUNITY): Payer: Self-pay | Admitting: Emergency Medicine

## 2012-12-04 ENCOUNTER — Emergency Department (HOSPITAL_COMMUNITY)
Admission: EM | Admit: 2012-12-04 | Discharge: 2012-12-04 | Disposition: A | Discharge status: Home / Self Care | Payer: Medicare Other | Attending: Emergency Medicine | Admitting: Emergency Medicine

## 2012-12-04 DIAGNOSIS — F17200 Nicotine dependence, unspecified, uncomplicated: Secondary | ICD-10-CM | POA: Diagnosis not present

## 2012-12-04 DIAGNOSIS — M7710 Lateral epicondylitis, unspecified elbow: Secondary | ICD-10-CM | POA: Diagnosis not present

## 2012-12-04 DIAGNOSIS — IMO0002 Reserved for concepts with insufficient information to code with codable children: Secondary | ICD-10-CM | POA: Diagnosis not present

## 2012-12-04 DIAGNOSIS — R109 Unspecified abdominal pain: Secondary | ICD-10-CM | POA: Diagnosis not present

## 2012-12-04 DIAGNOSIS — X503XXA Overexertion from repetitive movements, initial encounter: Secondary | ICD-10-CM | POA: Insufficient documentation

## 2012-12-04 DIAGNOSIS — M25529 Pain in unspecified elbow: Secondary | ICD-10-CM | POA: Diagnosis not present

## 2012-12-04 DIAGNOSIS — Y939 Activity, unspecified: Secondary | ICD-10-CM | POA: Insufficient documentation

## 2012-12-04 DIAGNOSIS — T148XXA Other injury of unspecified body region, initial encounter: Secondary | ICD-10-CM

## 2012-12-04 DIAGNOSIS — Y9269 Other specified industrial and construction area as the place of occurrence of the external cause: Secondary | ICD-10-CM | POA: Insufficient documentation

## 2012-12-04 DIAGNOSIS — M7702 Medial epicondylitis, left elbow: Secondary | ICD-10-CM

## 2012-12-04 LAB — CBC WITH DIFFERENTIAL/PLATELET
Basophils Absolute: 0 10*3/uL (ref 0.0–0.1)
Basophils Relative: 0 % (ref 0–1)
Eosinophils Relative: 4 % (ref 0–5)
Hemoglobin: 13.6 g/dL (ref 13.0–17.0)
Lymphocytes Relative: 37 % (ref 12–46)
Lymphs Abs: 1.9 10*3/uL (ref 0.7–4.0)
MCH: 30.8 pg (ref 26.0–34.0)
Monocytes Absolute: 0.7 10*3/uL (ref 0.1–1.0)
Neutro Abs: 2.3 10*3/uL (ref 1.7–7.7)
Neutrophils Relative %: 44 % (ref 43–77)
Platelets: 265 10*3/uL (ref 150–400)
RDW: 12.3 % (ref 11.5–15.5)

## 2012-12-04 LAB — COMPREHENSIVE METABOLIC PANEL
ALT: 32 U/L (ref 0–53)
Albumin: 3.2 g/dL — ABNORMAL LOW (ref 3.5–5.2)
BUN: 17 mg/dL (ref 6–23)
Chloride: 100 mEq/L (ref 96–112)
Creatinine, Ser: 0.83 mg/dL (ref 0.50–1.35)
GFR calc Af Amer: >90 mL/min (ref >90)
Glucose, Bld: 106 mg/dL — ABNORMAL HIGH (ref 70–99)
Sodium: 136 mEq/L (ref 135–145)
Total Bilirubin: 0.5 mg/dL (ref 0.3–1.2)

## 2012-12-04 MED ORDER — NAPROXEN 500 MG PO TABS: 500.0000 mg | ORAL_TABLET | Freq: Two times a day (BID) | ORAL | Status: DC

## 2012-12-04 NOTE — ED Notes (Signed)
Pt states he's having L elbow pain x couple days after washing a couple cars, then having lower abdominal pain after metal parts in bed broke and he lifted a pressure washer, pt denies n/v/d, pt denies urinary symptoms.

## 2012-12-04 NOTE — ED Provider Notes (Signed)
History     CSN: [id number]  Arrival date & time 12/04/12  1458   First MD Initiated Contact with Patient 12/04/12 1624      Chief Complaint  Patient presents with  . Elbow Pain  . Abdominal Pain    (Consider location/radiation/quality/duration/timing/severity/associated sxs/prior treatment) HPI Comments: This is a 57 year old male, no pertinent past medical history, who presents emergency department with chief complaints of left elbow pain, and low abdominal pain.  Patient washes cars for a living. He also states that he noticed the abdominal pain, after he picked up a large power washer and put it in his truck. He has not tried anything to alleviate his symptoms.  Repetitive motion makes the elbow pain worse.  His symptoms are moderate in severity.  The history is provided by the patient. No language interpreter was used.    History reviewed. No pertinent past medical history.  Past Surgical History  Procedure Laterality Date  . Knee arthroscopy      Family History  Problem Relation Age of Onset  . Heart failure Mother   . Heart failure Father     History  Substance Use Topics  . Smoking status: Current Every Day Smoker -- 0.50 packs/day    Types: Cigarettes  . Smokeless tobacco: Not on file  . Alcohol Use: Yes     Comment: 2 quarts beer everyday      Review of Systems  All other systems reviewed and are negative.    Allergies  Penicillins  Home Medications  No current outpatient prescriptions on file.  BP 124/83  Pulse 60  Temp(Src) 98 F (36.7 C) (Oral)  Resp 16  SpO2 97%  Physical Exam  Nursing note and vitals reviewed. Constitutional: He is oriented to person, place, and time. He appears well-developed and well-nourished.  HENT:  Head: Normocephalic and atraumatic.  Eyes: Conjunctivae and EOM are normal. Pupils are equal, round, and reactive to light. Right eye exhibits no discharge. Left eye exhibits no discharge. No scleral icterus.  Neck:  Normal range of motion. Neck supple. No JVD present.  Cardiovascular: Normal rate, regular rhythm, normal heart sounds and intact distal pulses.  Exam reveals no gallop and no friction rub.   No murmur heard. Pulmonary/Chest: Effort normal and breath sounds normal. No respiratory distress. He has no wheezes. He has no rales. He exhibits no tenderness.  Abdominal: Soft. Bowel sounds are normal. He exhibits no distension and no mass. There is no tenderness. There is no rebound and no guarding.  No palpable masses, or hernias  Genitourinary: Penis normal.  Penis is normal without discharge, no tenderness of the testicles, no masses or hernias appreciated on exam  Musculoskeletal: Normal range of motion. He exhibits no edema and no tenderness.  Neurological: He is alert and oriented to person, place, and time.  Skin: Skin is warm and dry.  Psychiatric: He has a normal mood and affect. His behavior is normal. Judgment and thought content normal.    ED Course  Procedures (including critical care time)  Labs Reviewed  CBC WITH DIFFERENTIAL - Abnormal; Notable for the following:    Monocytes Relative 14 (*)    All other components within normal limits  COMPREHENSIVE METABOLIC PANEL - Abnormal; Notable for the following:    Glucose, Bld 106 (*)    Albumin 3.2 (*)    All other components within normal limits   Dg Elbow 2 Views Left  12/04/2012  *RADIOLOGY REPORT*  Clinical Data: Posterior elbow pain.  No injury.  LEFT ELBOW - 2 VIEW  Comparison: None.  Findings: No acute bony abnormality.  Specifically, no fracture, subluxation, or dislocation.  Soft tissues are intact.  No joint effusion.  IMPRESSION: No acute bony abnormality.   Original Report Authenticated By: Charlett Nose, M.D.      1. Golfer's elbow, left   2. Muscle strain       MDM  Patient with elbow pain, likely from overuse injury, no signs of septic joint.  FROM, no signs of infection.  Abdominal pain likely muscle strain  from lifting power washer.  No hernias or palpable/reproducible masses felt on exam of abdomen and scrotum.       Roxy Horseman, PA-C 12/09/12 [phone]

## 2012-12-04 NOTE — ED Notes (Signed)
Pt c/o L elbow pain and swelling for the past 2 days. Pt has some swelling to L elbow. Pt also c/o pain below umbilicus for the last 2 or 3 days. Pt reports pain is worse in the morning and worse when he moves from sitting position and walks around. Pt denies n/v/d. Pt describes pain as sharp and constant.

## 2012-12-13 NOTE — ED Provider Notes (Signed)
History/physical exam/procedure(s) were performed by non-physician practitioner and as supervising physician I was immediately available for consultation/collaboration. I have reviewed all notes and am in agreement with care and plan.   Hilario Quarry, MD 12/13/12 [phone]

## 2013-07-09 DIAGNOSIS — M2550 Pain in unspecified joint: Secondary | ICD-10-CM | POA: Diagnosis not present

## 2013-07-09 DIAGNOSIS — F17200 Nicotine dependence, unspecified, uncomplicated: Secondary | ICD-10-CM | POA: Diagnosis not present

## 2013-07-09 DIAGNOSIS — J209 Acute bronchitis, unspecified: Secondary | ICD-10-CM | POA: Diagnosis not present

## 2013-07-09 DIAGNOSIS — M25569 Pain in unspecified knee: Secondary | ICD-10-CM | POA: Diagnosis not present

## 2013-07-09 DIAGNOSIS — E559 Vitamin D deficiency, unspecified: Secondary | ICD-10-CM | POA: Diagnosis not present

## 2014-04-27 ENCOUNTER — Emergency Department (HOSPITAL_COMMUNITY)
Admission: EM | Admit: 2014-04-27 | Discharge: 2014-04-27 | Disposition: A | Discharge status: Home / Self Care | Payer: Medicare Other | Source: Home / Self Care | Attending: Family Medicine | Admitting: Family Medicine

## 2014-04-27 ENCOUNTER — Encounter (HOSPITAL_COMMUNITY): Payer: Self-pay | Admitting: Emergency Medicine

## 2014-04-27 DIAGNOSIS — M545 Low back pain, unspecified: Secondary | ICD-10-CM | POA: Diagnosis not present

## 2014-04-27 MED ORDER — DICLOFENAC SODIUM 50 MG PO TBEC: 50.0000 mg | DELAYED_RELEASE_TABLET | Freq: Two times a day (BID) | ORAL | Status: DC | PRN

## 2014-04-27 MED ORDER — CYCLOBENZAPRINE HCL 10 MG PO TABS: 10.0000 mg | ORAL_TABLET | Freq: Every evening | ORAL | Status: DC | PRN

## 2014-04-27 NOTE — ED Notes (Signed)
C/o lower back pain since last week States he was cutting down some trees Did use advil for back pain

## 2014-04-27 NOTE — ED Provider Notes (Signed)
Roy Martinez is a 58 y.o. male who presents to Urgent Care today for lumbago. Patient is a three-day history of left-sided low back pain. Pain occurred after patient cut down some trees. He denies any specific injury. He denies any radiating pain weakness or numbness. No fevers or chills nausea vomiting or diarrhea. He's tried Advil which helps some. He's had pain like this in the past and done well.   History reviewed. No pertinent past medical history. History  Substance Use Topics  . Smoking status: Current Every Day Smoker -- 0.50 packs/day    Types: Cigarettes  . Smokeless tobacco: Not on file  . Alcohol Use: Yes     Comment: 2 quarts beer everyday   ROS as above Medications: No current facility-administered medications for this encounter.   Current Outpatient Prescriptions  Medication Sig Dispense Refill  . cyclobenzaprine (FLEXERIL) 10 MG tablet Take 1 tablet (10 mg total) by mouth at bedtime as needed for muscle spasms.  20 tablet  0  . diclofenac (VOLTAREN) 50 MG EC tablet Take 1 tablet (50 mg total) by mouth 2 (two) times daily as needed.  60 tablet  0    Exam:  BP 146/92  Pulse 66  Temp(Src) 98.1 F (36.7 C) (Oral)  Resp 18  SpO2 95% Gen: Well NAD HEENT: EOMI,  MMM Lungs: Normal work of breathing. CTABL Heart: RRR no MRG Abd: NABS, Soft. Nondistended, Nontender Exts: Brisk capillary refill, warm and well perfused.  Back: Nontender to spinal midline. Tender palpation left lumbar paraspinal and SI joint. Negative straight leg raise test and Faber test and FADIR test bilaterally Lumbar range of motion is slightly limited flexion but normal otherwise. Lower extremity strength reflexes and sensation are intact and equal bilateral Normal gait  No results found for this or any previous visit (from the past 24 hour(s)). No results found.  Assessment and Plan: 58 y.o. male with lumbago due to myofascial disruption. Plan to treat with NSAIDs and Flexeril. Followup as  needed. Home Exercise program.  Discussed warning signs or symptoms. Please see discharge instructions. Patient expresses understanding.   This note was created using Systems analyst. Any transcription errors are unintended.    Gregor Hams, MD 04/27/14 [phone]

## 2014-04-27 NOTE — Discharge Instructions (Signed)
Thank you for coming in today. Take diclofenac instead of ibuprofen twice daily for pain as needed.  Use Flexeril at bedtime as needed for pain Try using a heating pad Come back as needed Come back or go to the emergency room if you notice new weakness new numbness problems walking or bowel or bladder problems.  Back Pain, Adult Low back pain is very common. About 1 in 5 people have back pain.The cause of low back pain is rarely dangerous. The pain often gets better over time.About half of people with a sudden onset of back pain feel better in just 2 weeks. About 8 in 10 people feel better by 6 weeks.  CAUSES Some common causes of back pain include:  Strain of the muscles or ligaments supporting the spine.  Wear and tear (degeneration) of the spinal discs.  Arthritis.  Direct injury to the back. DIAGNOSIS Most of the time, the direct cause of low back pain is not known.However, back pain can be treated effectively even when the exact cause of the pain is unknown.Answering your caregiver's questions about your overall health and symptoms is one of the most accurate ways to make sure the cause of your pain is not dangerous. If your caregiver needs more information, he or she may order lab work or imaging tests (X-rays or MRIs).However, even if imaging tests show changes in your back, this usually does not require surgery. HOME CARE INSTRUCTIONS For many people, back pain returns.Since low back pain is rarely dangerous, it is often a condition that people can learn to [facility] their own.   Remain active. It is stressful on the back to sit or stand in one place. Do not sit, drive, or stand in one place for more than 30 minutes at a time. Take short walks on level surfaces as soon as pain allows.Try to increase the length of time you walk each day.  Do not stay in bed.Resting more than 1 or 2 days can delay your recovery.  Do not avoid exercise or work.Your body is made to move.It  is not dangerous to be active, even though your back may hurt.Your back will likely heal faster if you return to being active before your pain is gone.  Pay attention to your body when you bend and lift. Many people have less discomfortwhen lifting if they bend their knees, keep the load close to their bodies,and avoid twisting. Often, the most comfortable positions are those that put less stress on your recovering back.  Find a comfortable position to sleep. Use a firm mattress and lie on your side with your knees slightly bent. If you lie on your back, put a pillow under your knees.  Only take over-the-counter or prescription medicines as directed by your caregiver. Over-the-counter medicines to reduce pain and inflammation are often the most helpful.Your caregiver may prescribe muscle relaxant drugs.These medicines help dull your pain so you can more quickly return to your normal activities and healthy exercise.  Put ice on the injured area.  Put ice in a plastic bag.  Place a towel between your skin and the bag.  Leave the ice on for 15-20 minutes, 03-04 times a day for the first 2 to 3 days. After that, ice and heat may be alternated to reduce pain and spasms.  Ask your caregiver about trying back exercises and gentle massage. This may be of some benefit.  Avoid feeling anxious or stressed.Stress increases muscle tension and can worsen back pain.It is important  to recognize when you are anxious or stressed and learn ways to manage it.Exercise is a great option. SEEK MEDICAL CARE IF:  You have pain that is not relieved with rest or medicine.  You have pain that does not improve in 1 week.  You have new symptoms.  You are generally not feeling well. SEEK IMMEDIATE MEDICAL CARE IF:   You have pain that radiates from your back into your legs.  You develop new bowel or bladder control problems.  You have unusual weakness or numbness in your arms or legs.  You develop  nausea or vomiting.  You develop abdominal pain.  You feel faint. Document Released: 09/10/2005 Document Revised: 03/11/2012 Document Reviewed: 01/12/2014 [facility] Patient Information 2015 [place], [place]. This information is not intended to replace advice given to you by your health care provider. Make sure you discuss any questions you have with your health care provider.

## 2014-05-19 ENCOUNTER — Emergency Department (HOSPITAL_COMMUNITY)
Admission: EM | Admit: 2014-05-19 | Discharge: 2014-05-19 | Disposition: A | Discharge status: Home / Self Care | Payer: Medicare Other | Attending: Emergency Medicine | Admitting: Emergency Medicine

## 2014-05-19 ENCOUNTER — Emergency Department (HOSPITAL_COMMUNITY): Payer: Medicare Other

## 2014-05-19 ENCOUNTER — Encounter (HOSPITAL_COMMUNITY): Payer: Self-pay | Admitting: Emergency Medicine

## 2014-05-19 DIAGNOSIS — M25561 Pain in right knee: Secondary | ICD-10-CM

## 2014-05-19 DIAGNOSIS — G8929 Other chronic pain: Secondary | ICD-10-CM | POA: Insufficient documentation

## 2014-05-19 DIAGNOSIS — X58XXXA Exposure to other specified factors, initial encounter: Secondary | ICD-10-CM | POA: Insufficient documentation

## 2014-05-19 DIAGNOSIS — M25569 Pain in unspecified knee: Secondary | ICD-10-CM | POA: Diagnosis not present

## 2014-05-19 DIAGNOSIS — Z880 Allergy status to penicillin: Secondary | ICD-10-CM | POA: Insufficient documentation

## 2014-05-19 DIAGNOSIS — S82009A Unspecified fracture of unspecified patella, initial encounter for closed fracture: Secondary | ICD-10-CM | POA: Insufficient documentation

## 2014-05-19 DIAGNOSIS — Y939 Activity, unspecified: Secondary | ICD-10-CM | POA: Insufficient documentation

## 2014-05-19 DIAGNOSIS — M1711 Unilateral primary osteoarthritis, right knee: Secondary | ICD-10-CM

## 2014-05-19 DIAGNOSIS — R29898 Other symptoms and signs involving the musculoskeletal system: Secondary | ICD-10-CM | POA: Diagnosis not present

## 2014-05-19 DIAGNOSIS — Y929 Unspecified place or not applicable: Secondary | ICD-10-CM | POA: Diagnosis not present

## 2014-05-19 DIAGNOSIS — IMO0002 Reserved for concepts with insufficient information to code with codable children: Secondary | ICD-10-CM | POA: Diagnosis not present

## 2014-05-19 DIAGNOSIS — F17200 Nicotine dependence, unspecified, uncomplicated: Secondary | ICD-10-CM | POA: Insufficient documentation

## 2014-05-19 DIAGNOSIS — M1710 Unilateral primary osteoarthritis, unspecified knee: Secondary | ICD-10-CM | POA: Insufficient documentation

## 2014-05-19 DIAGNOSIS — M25469 Effusion, unspecified knee: Secondary | ICD-10-CM | POA: Diagnosis not present

## 2014-05-19 MED ORDER — TRAMADOL HCL 50 MG PO TABS: 50.0000 mg | ORAL_TABLET | Freq: Four times a day (QID) | ORAL | Status: DC | PRN

## 2014-05-19 NOTE — Discharge Instructions (Signed)
Take pain medication as needed.  Do not drive or operate heavy machinery for 4-6 hours after taking medication.  Be sure to read and understand instructions below prior to leaving the hospital. If your symptoms persist without any improvement in 1 week it is recommended that you follow up with orthopedics listed above. Use your pain medication as prescribed and do not operate heavy machinery while on pain medication.  Knee Effusion  The medical term for having fluid in your knee is effusion.This means something is wrong inside the knee. Some of the causes of fluid in the knee may be torn cartilage, a torn ligament, or bleeding into the joint from an injury. Small tears may heal on their own with conservative treatment. Conservative means rest, limited weight bearing activity and muscle strengthening exercises. Your recovery may take up to 6 weeks. Larger tears may require surgery.   TREATMENT  Rest, ice, elevation, and compression are the basic modes of treatment.   Apply ice to the sore area for 15 to 20 minutes, 3 to 4 times per day. Do this while you are awake for the first 2 days, or as directed. This can be stopped when the swelling goes away. Put the ice in a plastic bag and place a towel between the bag of ice and your skin.  Keep your leg elevated when possible to lessen swelling.  If your caregiver recommends crutches, use them as instructed for 1 week. Then, you may walk as tolerated.  Do not drive a vehicle on pain medication. ACTIVITY:            - Weight bearing as tolerated            - Exercises should be limited to pain free range of motion  Knee Immobilization:: This is used to support and protect an injured or painful knee. Knee immobilizers keep your knee from being used while it is healing.  Use powder to control irritation from sweat and friction.  Adjust the immobilizer to be firm but not tight. Signs of an immobilizer that is too tight include:   Swelling.   Numbness.    Color change in your foot or ankle.   Increased pain.  While resting, raise your leg above the level of your heart. This reduces throbbing and helps healing. Prop it up with pillows.  Remove the immobilizer to bathe and sleep. Wear it other times until you see your doctor again.               SEEK MEDICAL CARE IF:  You have an increase in bruising, swelling, or pain.  Your toes feel cold.  Pain relief is not achieved with medications.  EMERGENCY:: Your toes are numb or blue or you have severe pain.  You notice redness, swelling, warmth or increasing pain in your knee.  An unexplained oral temperature above 102 F (38.9 C) develops.  COLD THERAPY DIRECTIONS:  Ice or gel packs can be used to reduce both pain and swelling. Ice is the most helpful within the first 24 to 48 hours after an injury or flareup from overusing a muscle or joint.  Ice is effective, has very few side effects, and is safe for most people to use.   If you expose your skin to cold temperatures for too long or without the proper protection, you can damage your skin or nerves. Watch for signs of skin damage due to cold.   HOME CARE INSTRUCTIONS  Follow these tips to use  ice and cold packs safely.  Place a dry or damp towel between the ice and skin. A damp towel will cool the skin more quickly, so you may need to shorten the time that the ice is used.  For a more rapid response, add gentle compression to the ice.  Ice for no more than 10 to 20 minutes at a time. The bonier the area you are icing, the less time it will take to get the benefits of ice.  Check your skin after 5 minutes to make sure there are no signs of a poor response to cold or skin damage.  Rest 20 minutes or more in between uses.  Once your skin is numb, you can end your treatment. You can test numbness by very lightly touching your skin. The touch should be so light that you do not see the skin dimple from the pressure of your fingertip. When using ice,  most people will feel these normal sensations in this order: cold, burning, aching, and numbness.  Do not use ice on someone who cannot communicate their responses to pain, such as small children or people with dementia.   HOW TO MAKE AN ICE PACK  To make an ice pack, do one of the following:  Place crushed ice or a bag of frozen vegetables in a sealable plastic bag. Squeeze out the excess air. Place this bag inside another plastic bag. Slide the bag into a pillowcase or place a damp towel between your skin and the bag.  Mix 3 parts water with 1 part rubbing alcohol. Freeze the mixture in a sealable plastic bag. When you remove the mixture from the freezer, it will be slushy. Squeeze out the excess air. Place this bag inside another plastic bag. Slide the bag into a pillowcase or place a damp towel between your s

## 2014-05-19 NOTE — ED Provider Notes (Signed)
CSN: [id number]     Arrival date & time 05/19/14  1449 History  This chart was scribed for non-physician practitioner, Hyman Bible, PA-C,working with Pamella Pert, MD, by Marlowe Kays, ED Scribe. This patient was seen in room WTR6/WTR6 and the patient's care was started at 3:50 PM.  Chief Complaint  Patient presents with  . Mass   The history is provided by the patient. No language interpreter was used.   HPI Comments:  Roy Martinez is a 58 y.o. male who presents to the Emergency Department complaining of swelling to the lateral aspect of his right knee that has been present for approximately one year. He states that he has had pain intermittently for the past year. Pt states he has not taken anything for the pain. He denies fever, chills, numbness, tingling, weakness, redness, warmth, injury, trauma or fall.  History reviewed. No pertinent past medical history. Past Surgical History  Procedure Laterality Date  . Knee arthroscopy     Family History  Problem Relation Age of Onset  . Heart failure Mother   . Heart failure Father    History  Substance Use Topics  . Smoking status: Current Every Day Smoker -- 0.50 packs/day    Types: Cigarettes  . Smokeless tobacco: Not on file  . Alcohol Use: Yes     Comment: 2 quarts beer everyday    Review of Systems  Musculoskeletal: Positive for joint swelling. Negative for gait problem.  Skin: Negative for color change and wound.  Neurological: Negative for weakness and numbness.   Allergies  Penicillins  Home Medications   Prior to Admission medications   Medication Sig Start Date End Date Taking? Authorizing Provider  cyclobenzaprine (FLEXERIL) 10 MG tablet Take 1 tablet (10 mg total) by mouth at bedtime as needed for muscle spasms. 04/27/14  Yes Gregor Hams, MD  diclofenac (VOLTAREN) 50 MG EC tablet Take 50 mg by mouth 2 (two) times daily as needed for mild pain.   Yes Historical Provider, MD   Triage Vitals: BP 129/74   Pulse 58  Temp(Src) 98.6 F (37 C) (Oral)  Resp 20  SpO2 99% Physical Exam  Nursing note and vitals reviewed. Constitutional: He is oriented to person, place, and time. He appears well-developed and well-nourished.  HENT:  Head: Normocephalic and atraumatic.  Eyes: EOM are normal.  Neck: Normal range of motion. Neck supple.  Cardiovascular: Normal rate, regular rhythm and normal heart sounds.   DP pulses intact.  Pulmonary/Chest: Effort normal and breath sounds normal.  Musculoskeletal: Normal range of motion.       Right knee: He exhibits swelling and effusion. He exhibits normal range of motion and no erythema.  Swelling of lat aspect of right knee. No erythema or warmth.  Neurological: He is alert and oriented to person, place, and time.  Sensations intact of right leg and foot.  Skin: Skin is warm and dry.  Psychiatric: He has a normal mood and affect. His behavior is normal.    ED Course  Procedures (including critical care time) DIAGNOSTIC STUDIES: Oxygen Saturation is 99% on RA, normal by my interpretation.   COORDINATION OF CARE: 3:57 PM- Will X-Ray right knee. Pt verbalizes understanding and agrees to plan.  Medications - No data to display  Labs Review Labs Reviewed - No data to display  Imaging Review Dg Knee Complete 4 Views Right  05/19/2014   CLINICAL DATA:  Pain.  EXAM: RIGHT KNEE - COMPLETE 4+ VIEW  COMPARISON:  None.  FINDINGS: Prominent fracture fragment noted arising from the inferior aspect of the patella, age undetermined. Associated quadriceps and patellar tendon thickening. Patellar tendinosis/tendinitis may be present. Prepatellar ossification is present. This may be from prior injury. Tricompartment degenerative change present particularly severe about the patellofemoral space. Small knee effusion may be present. Distal femur and tibial plateau intact.  IMPRESSION: 1. Prominent bony density noted along the inferior patella consistent with inferior  patellar fracture, age undetermined. Associated swelling of the quadriceps and patellar tendon noted. Tendinosis/tendinitis cannot be excluded. 2. Prepatellar ossification.  This may be from prior injury. 3. Tricompartment degenerative change. The degenerative changes are particularly severe about the patellofemoral joint space. Associated small knee joint effusion noted.   Electronically Signed   By: Marcello Moores  Register   On: 05/19/2014 16:44     EKG Interpretation None      MDM   Final diagnoses:  Osteoarthritis of right knee, unspecified osteoarthritis type   Patient presenting with chronic right knee pain.  No signs of infection on exam.  Xray showing tricompartment degenerative changes.  Xray also showing inferior patellar fracture age undetermined.  No recent injury or trauma and pain is chronic.  Therefore, feel that this fracture is not acute.  Patient stable for discharge.  Patient given referral to Orthopedics and pain medication.  Return precautions given.  I personally performed the services described in this documentation, which was scribed in my presence. The recorded information has been reviewed and is accurate.    Hyman Bible, PA-C 05/21/14 1539

## 2014-05-19 NOTE — ED Notes (Signed)
Pt reports to ED for soft, fixed mass to right lateral knee, pt denies pain to mass until this morning when intermittent pain began. Pt unable to further describe pain.

## 2014-05-19 NOTE — Progress Notes (Signed)
  CARE MANAGEMENT ED NOTE 05/19/2014  Patient:  Roy Martinez, Roy Martinez   Account Number:  [id number]  Date Initiated:  05/19/2014  Documentation initiated by:  Jackelyn Poling  Subjective/Objective Assessment:   58 yr old medicare pt (previous [facility] pt) a mass to the lateral aspect of his right knee that has been present for approximately one year     Subjective/Objective Assessment Detail:   Pt reports not being seen by a pcp since health serve was opened Reports he was called by "health serve staff but never called back or went for follow up"  Pt agreed to a list of medicare providers and information for new [facility] contact information to make an appointment if he decides to follow up there     Action/Plan:   ED CM spoke with pt Provided pt with resources for [facility] [place] and for medicare providers within zip code [phone] -7 page list   Action/Plan Detail:   Anticipated DC Date:  05/19/2014     Status Recommendation to Physician:   Result of Recommendation:    Other ED [place]  Other  PCP issues  Outpatient Services - Pt will follow up    Choice offered to / List presented to:            Status of service:  Completed, signed off  ED Comments:   ED Comments Detail:  CM spoke with pt who confirms self pay [facility] resident with no pcp. CM discussed and provided written information for self pay pcps, importance of pcp for f/u care, www.needymeds.org, discounted pharmacies and other [employer] such as [employer], affordable care act,  financial assistance, DSS and  health department Reviewed resources for [employer] self pay pcps like [facility] Blossom, family medicine at [address], [facility] family practice, general medical clinics, [facility] urgent care plus others, medication resources, [facility] out patient pharmacies and housing Pt voiced understanding and appreciation of resources provided

## 2014-05-26 NOTE — ED Provider Notes (Signed)
Medical screening examination/treatment/procedure(s) were performed by non-physician practitioner and as supervising physician I was immediately available for consultation/collaboration.   EKG Interpretation None        Pamella Pert, MD 05/26/14 [phone]

## 2014-08-11 ENCOUNTER — Emergency Department (HOSPITAL_COMMUNITY)
Admission: EM | Admit: 2014-08-11 | Discharge: 2014-08-11 | Disposition: A | Discharge status: Home / Self Care | Payer: Medicare Other | Attending: Emergency Medicine | Admitting: Emergency Medicine

## 2014-08-11 ENCOUNTER — Encounter (HOSPITAL_COMMUNITY): Payer: Self-pay | Admitting: Family Medicine

## 2014-08-11 DIAGNOSIS — Z720 Tobacco use: Secondary | ICD-10-CM | POA: Diagnosis not present

## 2014-08-11 DIAGNOSIS — M79602 Pain in left arm: Secondary | ICD-10-CM | POA: Diagnosis not present

## 2014-08-11 DIAGNOSIS — Z880 Allergy status to penicillin: Secondary | ICD-10-CM | POA: Diagnosis not present

## 2014-08-11 DIAGNOSIS — R0781 Pleurodynia: Secondary | ICD-10-CM | POA: Diagnosis not present

## 2014-08-11 DIAGNOSIS — M25512 Pain in left shoulder: Secondary | ICD-10-CM | POA: Diagnosis present

## 2014-08-11 MED ORDER — TRAMADOL HCL 50 MG PO TABS: 50.0000 mg | ORAL_TABLET | Freq: Four times a day (QID) | ORAL | Status: DC | PRN

## 2014-08-11 MED ORDER — CYCLOBENZAPRINE HCL 10 MG PO TABS: 10.0000 mg | ORAL_TABLET | Freq: Every evening | ORAL | Status: DC | PRN

## 2014-08-11 MED ORDER — DICLOFENAC SODIUM 50 MG PO TBEC: 50.0000 mg | DELAYED_RELEASE_TABLET | Freq: Two times a day (BID) | ORAL | Status: DC | PRN

## 2014-08-11 NOTE — Discharge Instructions (Signed)
Musculoskeletal Pain °Musculoskeletal pain is muscle and boney aches and pains. These pains can occur in any part of the body. Your caregiver may treat you without knowing the cause of the pain. They may treat you if blood or urine tests, X-rays, and other tests were normal.  °CAUSES °There is often not a definite cause or reason for these pains. These pains may be caused by a type of germ (virus). The discomfort may also come from overuse. Overuse includes working out too hard when your body is not fit. Boney aches also come from weather changes. Bone is sensitive to atmospheric pressure changes. °HOME CARE INSTRUCTIONS  °· Ask when your test results will be ready. Make sure you get your test results. °· Only take over-the-counter or prescription medicines for pain, discomfort, or fever as directed by your caregiver. If you were given medications for your condition, do not drive, operate machinery or power tools, or sign legal documents for 24 hours. Do not drink alcohol. Do not take sleeping pills or other medications that may interfere with treatment. °· Continue all activities unless the activities cause more pain. When the pain lessens, slowly resume normal activities. Gradually increase the intensity and duration of the activities or exercise. °· During periods of severe pain, bed rest may be helpful. Lay or sit in any position that is comfortable. °· Putting ice on the injured area. °¨ Put ice in a bag. °¨ Place a towel between your skin and the bag. °¨ Leave the ice on for 15 to 20 minutes, 3 to 4 times a day. °· Follow up with your caregiver for continued problems and no reason can be found for the pain. If the pain becomes worse or does not go away, it may be necessary to repeat tests or do additional testing. Your caregiver may need to look further for a possible cause. °SEEK IMMEDIATE MEDICAL CARE IF: °· You have pain that is getting worse and is not relieved by medications. °· You develop chest pain  that is associated with shortness or breath, sweating, feeling sick to your stomach (nauseous), or throw up (vomit). °· Your pain becomes localized to the abdomen. °· You develop any new symptoms that seem different or that concern you. °MAKE SURE YOU:  °· Understand these instructions. °· Will watch your condition. °· Will get help right away if you are not doing well or get worse. °Document Released: 09/10/2005 Document Revised: 12/03/2011 Document Reviewed: 05/15/2013 °ExitCare® Patient Information ©2015 [employer]. This information is not intended to replace advice given to you by your health care provider. Make sure you discuss any questions you have with your health care provider. ° ° ° ° °Emergency Department Resource Guide °1) Find a Doctor and Pay Out of Pocket °Although you won't have to find out who is covered by your insurance plan, it is a good idea to ask around and get recommendations. You will then need to call the office and see if the doctor you have chosen will accept you as a new patient and what types of options they offer for patients who are self-pay. Some doctors offer discounts or will set up payment plans for their patients who do not have insurance, but you will need to ask so you aren't surprised when you get to your appointment. ° °2) Contact Your Local Health Department °Not all health departments have doctors that can see patients for sick visits, but many do, so it is worth a call to see if   yours does. If you don't know where your local health department is, you can check in your phone book. The CDC also has a tool to help you locate your state's health department, and many state websites also have listings of all of their local health departments. ° °3) Find a Walk-in Clinic °If your illness is not likely to be very severe or complicated, you may want to try a walk in clinic. These are popping up all over the country in pharmacies, drugstores, and shopping centers. They're usually  staffed by nurse practitioners or physician assistants that have been trained to treat common illnesses and complaints. They're usually fairly quick and inexpensive. However, if you have serious medical issues or chronic medical problems, these are probably not your best option. ° °No Primary Care Doctor: °- Call Health Connect at  832-8000 - they can help you locate a primary care doctor that  accepts your insurance, provides certain services, etc. °- Physician Referral Service- 1-800-533-3463 ° °Chronic Pain Problems: °Organization         Address  Phone   Notes  °[facility]  (336) 297-2271 Patients need to be referred by their primary care doctor.  ° °Medication Assistance: °Organization         Address  Phone   Notes  °[place] [facility] [address] °[place], [place] [address] (336) 641-8030 --Must be a resident of [place] °-- Must have NO insurance coverage whatsoever (no Medicaid/ Medicare, etc.) °-- The pt. MUST have a primary care doctor that directs their care regularly and follows them in the community °  °[facility]  (866) 331-1348   °[employer]  (888) 892-1162   ° °Agencies that provide inexpensive medical care: °Organization         Address  Phone   Notes  °[facility]  (336) 832-8035   °[facility]    (336) 832-7272   °[facility] [address] °[place], [place] [address] (336) 832-4777   °[facility] [address], °[place] (336) 271-4999   °[facility]    (336) 373-0678   °[facility]    (336) 272-1050   °[facility] ° [address], [place] Phone:  (336) 832-4444, Fax:  (336) 832-4440 Hours of Operation:  9 am - 6 pm, M-F.  Also accepts Medicaid/Medicare and self-pay.  °[facility] ° [address], [place] Phone: (336) 832-3150, Fax: (336) 832-3151. Hours of  Operation:  8:30 am - 5:30 pm, M-F.  Also accepts Medicaid and self-pay.  °[facility] [place] [address], [place] Phone: (336) 878-6027   °[facility] [address], [place], [place] (336)723-1848, Ext. 123 Mondays & Thursdays: 7-9 AM.  First 15 patients are seen on a first come, first serve basis. °  ° °Medicaid-accepting [place] Providers: ° °Organization         Address  Phone   Notes  °[facility] [address], [place] (336) 641-2100 Also accepts self-pay patients.  °[facility] [address], [place] ° (336) 856-9996   °[facility] [address], [place] (336) 288-8857   °[facility] [address] [place] [address], [place] (336) 299-7000   °Veita Bland [address], [place]  ° (336)   373-1557 Only accepts [place] Access Medicaid patients after they have their name applied to their card.  ° °Self-Pay (no insurance) in [place]: ° °Organization         Address  Phone   Notes  °Sickle Cell Patients, [facility] [address], [place] (336) 832-1970   °[facility] [address], [place] (336) 832-4400   °[facility] [place] ° [address], [place] (336) 992-4800   °[facility]/Dr. Osei-Bonsu ° [address] [place] [address], [place] or [address], [place] (336) [phone] Phone number for both [place] and [place] locations is the same.  °[facility] [address], [place] (336) 299-0000   °[facility] [address] [place] [address], [place] or [address] (336) 852-7530 °(336) 878-2260   °[facility] [address], [place] (336) 350-1642, phone; (336) 294-5005, fax Sees patients 1st and 3rd Saturday of every month.  Must not qualify for public or private insurance (i.e. Medicaid, Medicare,  [place] Health Choice, Veterans' Benefits) • Household income should be no more than 200% of the poverty level •The clinic cannot treat you if you are pregnant or think you are pregnant • Sexually transmitted diseases are not treated at the clinic.  ° ° °Dental Care: °Organization         Address  Phone  Notes  °[facility] [facility] [address], [place] (336) 641-6152 Accepts children up to age 21 who are enrolled in Medicaid or [place] Health Choice; pregnant women with a Medicaid card; and children who have applied for Medicaid or [place] Health Choice, but were declined, whose parents can pay a reduced fee at time of service.  °[facility] [place]  [address], [place] (336) 641-7733 Accepts children up to age 21 who are enrolled in Medicaid or [place] Health Choice; pregnant women with a Medicaid card; and children who have applied for Medicaid or [place] Health Choice, but were declined, whose parents can pay a reduced fee at time of service.  °[facility] ° [address], [place] (336) 641-4533 Patients are seen by appointment only. Walk-ins are not accepted. [facility] will see patients 18 years of age and older. °Monday - Tuesday (8am-5pm) °Most Wednesdays (8:30-5pm) °$30 per visit, cash only  °[facility] ° [address], [place] (336) 641-4533 Patients are seen by appointment only. Walk-ins are not accepted. [facility] will see patients 18 years of age and older. °One Wednesday Evening (Monthly: Volunteer Based).  $30 per visit, cash only  °[facility]  (919) 537-3737 for adults; Children under age 4, call Graduate Pediatric Dentistry at (919) [phone]. Children aged 4-14, please call (919) [phone] to request a pediatric application. ° Dental services are provided in all areas of dental care including fillings, crowns and bridges,  complete and partial dentures, implants, gum treatment, root canals, and extractions. Preventive care is also provided. Treatment is provided to both adults and children. °Patients are selected via a lottery and there is often a waiting list. °  °[facility] [address], ° ° (336) 763-8833 www.drcivils.[email or web address] °  °[facility] [address], [place], [place] (336)723-1848, Ext. 123 Second and Fourth Thursday of each month, opens at 6:30 AM;   Clinic ends at 9 AM.  Patients are seen on a first-come first-served basis, and a limited number are seen during each clinic.  ° °[facility] ° [address], [place], [place] (336) 723-7904   Eligibility Requirements °You must have lived in [place], [place], or [place] counties for at least the last three months. °  You cannot be eligible for state or federal sponsored healthcare insurance, including Veterans Administration, Medicaid, or Medicare. °  You generally cannot be eligible for healthcare insurance through your employer.  °  How to apply: °Eligibility screenings are held every Tuesday and Wednesday afternoon from 1:00 pm until 4:00 pm. You do not need an appointment for the interview!  °[facility] [address], [place], [place] 336-631-2330   °[place] [facility]  336-342-8273   °[place]  336-703-3100   °[facility]  336-570-6415   ° °Behavioral Health Resources in the Community: °Intensive Outpatient Programs °Organization         Address  Phone  Notes  °[facility] [address], [place], [place] 336-878-6098   °[facility] [address], [place], [place] 336-832-9800   °[facility] [address], [place], [place] ° 336-882-2125   °[facility] [address],  °[place], [place] 1-800-853-5163 or 336-641-4981   °Substance Abuse Resources °Organization          Address  Phone  Notes  °[facility]  336-882-2125   °[facility]  336-784-9470   °[facility]  336-285-9073   °[facility]  336-845-3988   °Residential & Outpatient Substance Abuse Program  1-800-659-3381   °Psychological Services °Organization         Address  Phone  Notes  °[facility]  336- 832-9600   °[facility]  336- 378-7881   °[facility] [address], [place] 1-800-853-5163 or 336-641-4981   ° °Mobile Crisis Teams °Organization         Address  Phone  Notes  °[facility], Mobile Crisis Care Unit  1-877-626-1772   °Assertive °[facility] ° [address] [place], [place] 336-834-9664   °Sharon DeEsch [address] °[place] 336-554-5454   ° °Self-Help/Support Groups °Organization         Address  Phone             Notes  °[facility] - variety of support groups  336- 373-1402 Call for more information  °Narcotics Anonymous (NA), [facility] [address], °[place]  2 meetings at this location  ° °Residential Treatment Programs °Organization         Address  Phone  Notes  °[facility] [address],    °[place]  1-866-801-8205   °[facility] ° [address], [place], [place] 704-293-8524   °[facility] [address], [place] 336-845-3988 Admissions: 8am-3pm M-F  °[facility] [address]    °[place], [place] 336-841-1104   °[facility] [address], [place], [place] 336-379-7146   °[facility] [address]  °[place], [place] 336-285-9073   °[facility] Intensive Outpatient [address], [place], [place] 336-852-3033   °[facility] [address]  °[place],  1-877-615-2722 or 336-784-9470   °  [facility] [address] [place], [place]  336-227-7417 Accepts Medicaid  °[facility] [address]  ° [place] 1-800-659-3381 Substance Abuse/Addiction Treatment  ° °[place] Behavioral Health Resources °Organization         Address  Phone  Notes  °[facility]  (888) 581-9988   °Julie Brannon, PhD [address] [place], [place]   (336) 349-5553 or (336) 951-0000   °[facility]   [address] °[place], [place] (336) 349-4454   °[facility] [address], [place], [place] (336) 342-8316 Insurance/Medicaid/sponsorship through [facility]  °[facility] [address]                                    [place], [place] (336) 342-8316 Therapy/tele-psych/case  °[facility] [address]  ° [place], [place] (336) 349-2233    °Dr. Arfeen  (336) 349-4544   °[facility] [place]  [employer] [place] [facility] 1) [address], [place] °2) [address], [place] °3)  [address], [place] (336) 349-3220 °(336) 342-7768 ° °(336) 342-8140   °[place] Child Abuse Hotline (336) 342-1394 or (336) 342-3537 (After Hours)    ° ° ° °

## 2014-08-11 NOTE — ED Provider Notes (Signed)
CSN: [id number]     Arrival date & time 08/11/14  1521 History  This chart was scribed for non-physician practitioner, Domenic Moras, PA-C working with Pamella Pert, MD by Frederich Balding, ED scribe. This patient was seen in room TR08C/TR08C and the patient's care was started at 3:31 PM.   Chief Complaint  Patient presents with  . Shoulder Pain   The history is provided by the patient. No language interpreter was used.    HPI Comments: Roy Martinez is a 58 y.o. male who presents to the Emergency Department complaining of sharp left shoulder pain that radiates to his upper arm and chest that started 3 days ago. 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 States he is left hand dominant. Movement worsens the pain. He has not tried anything for his symptoms. Pt has never had this pain before. Denies diaphoresis, trouble breathing, light headedness, dizziness, numbness. Pt smokes cigarettes daily. Denies history of heart problems.   History reviewed. No pertinent past medical history. Past Surgical History  Procedure Laterality Date  . Knee arthroscopy     Family History  Problem Relation Age of Onset  . Heart failure Mother   . Heart failure Father    History  Substance Use Topics  . Smoking status: Current Every Day Smoker -- 0.50 packs/day    Types: Cigarettes  . Smokeless tobacco: Not on file  . Alcohol Use: Yes     Comment: 2 quarts beer everyday    Review of Systems  Constitutional: Negative for diaphoresis.  Musculoskeletal: Positive for arthralgias.  Neurological: Negative for dizziness, light-headedness and numbness.  All other systems reviewed and are negative.  Allergies  Penicillins  Home Medications   Prior to Admission medications   Medication Sig Start Date End Date Taking? Authorizing Provider  cyclobenzaprine (FLEXERIL) 10 MG tablet Take 1 tablet (10 mg total) by mouth at bedtime as needed for muscle spasms.  04/27/14   Gregor Hams, MD  diclofenac (VOLTAREN) 50 MG EC tablet Take 50 mg by mouth 2 (two) times daily as needed for mild pain.    Historical Provider, MD  traMADol (ULTRAM) 50 MG tablet Take 1 tablet (50 mg total) by mouth every 6 (six) hours as needed. 05/19/14   Heather Laisure, PA-C   BP 138/77 mmHg  Pulse 57  Temp(Src) 97.9 F (36.6 C) (Oral)  Resp 20  SpO2 97%   Physical Exam  Constitutional: He is oriented to person, place, and time. He appears well-developed and well-nourished. No distress.  HENT:  Head: Normocephalic and atraumatic.  Eyes: Conjunctivae and EOM are normal.  Neck: Neck supple. No tracheal deviation present.  Cardiovascular: Normal rate and regular rhythm.   Pulmonary/Chest: Effort normal and breath sounds normal. No respiratory distress. He has no wheezes. He has no rhonchi. He has no rales.  Musculoskeletal: Normal range of motion.  Generalized tenderness throughout left rib lines and throughout left arm. Radial pulse 2+. Normal grip strength. Normal sensation.   Neurological: He is alert and oriented to person, place, and time.  Skin: Skin is warm and dry.  Psychiatric: He has a normal mood and affect. His behavior is normal.  Nursing note and vitals reviewed.   ED Course  Procedures (including critical care time)  DIAGNOSTIC STUDIES: Oxygen Saturation is 97% on RA, normal by my interpretation.    COORDINATION OF CARE: 3:35 PM-Pt with reproducible chest wall tenderness and L arm pain without focal  point tenderness.  Discussed treatment plan which includes EKG with pt at bedside and pt agreed to plan. If EKG is normal, pt will be discharged with NSAID and a muscle relaxer. Return precautions given. Will give pt PCP referrals and advised him to follow up.   Labs Review Labs Reviewed - No data to display  Imaging Review No results found.   EKG Interpretation None      Date: 08/11/2014  Rate: 55  Rhythm: normal sinus rhythm  QRS Axis: normal   Intervals: normal  ST/T Wave abnormalities: normal  Conduction Disutrbances: none  Narrative Interpretation:   Old EKG Reviewed: No significant changes noted     MDM   Final diagnoses:  Musculoskeletal arm pain, left    BP 138/77 mmHg  Pulse 57  Temp(Src) 97.9 F (36.6 C) (Oral)  Resp 20  SpO2 97%   I personally performed the services described in this documentation, which was scribed in my presence. The recorded information has been reviewed and is accurate.  Domenic Moras, PA-C 08/11/14 [place], MD 08/12/14 [id number]

## 2014-08-11 NOTE — ED Notes (Signed)
Per pt sts left shoulder pain with rotation x 3 days. 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 Pt able to move extremity with pain.

## 2015-03-09 ENCOUNTER — Emergency Department (HOSPITAL_COMMUNITY)
Admission: EM | Admit: 2015-03-09 | Discharge: 2015-03-09 | Disposition: A | Discharge status: Home / Self Care | Payer: Medicare Other | Attending: Emergency Medicine | Admitting: Emergency Medicine

## 2015-03-09 ENCOUNTER — Encounter (HOSPITAL_COMMUNITY): Payer: Self-pay | Admitting: Emergency Medicine

## 2015-03-09 DIAGNOSIS — Z720 Tobacco use: Secondary | ICD-10-CM | POA: Diagnosis not present

## 2015-03-09 DIAGNOSIS — Z880 Allergy status to penicillin: Secondary | ICD-10-CM | POA: Insufficient documentation

## 2015-03-09 DIAGNOSIS — M545 Low back pain: Secondary | ICD-10-CM | POA: Diagnosis not present

## 2015-03-09 LAB — CBC WITH DIFFERENTIAL/PLATELET
Basophils Absolute: 0 10*3/uL (ref 0.0–0.1)
Basophils Relative: 0 % (ref 0–1)
EOS PCT: 3 % (ref 0–5)
Eosinophils Absolute: 0.1 10*3/uL (ref 0.0–0.7)
HEMATOCRIT: 41.4 % (ref 39.0–52.0)
Hemoglobin: 13.9 g/dL (ref 13.0–17.0)
Lymphocytes Relative: 41 % (ref 12–46)
Lymphs Abs: 1.9 10*3/uL (ref 0.7–4.0)
MCH: 32.1 pg (ref 26.0–34.0)
MCHC: 33.6 g/dL (ref 30.0–36.0)
MCV: 95.6 fL (ref 78.0–100.0)
MONOS PCT: 12 % (ref 3–12)
Monocytes Absolute: 0.5 10*3/uL (ref 0.1–1.0)
Neutro Abs: 2.1 10*3/uL (ref 1.7–7.7)
Neutrophils Relative %: 44 % (ref 43–77)
Platelets: 268 10*3/uL (ref 150–400)
RBC: 4.33 MIL/uL (ref 4.22–5.81)
RDW: 12.6 % (ref 11.5–15.5)
WBC: 4.7 10*3/uL (ref 4.0–10.5)

## 2015-03-09 LAB — COMPREHENSIVE METABOLIC PANEL
ALT: 30 U/L (ref 17–63)
ANION GAP: 6 (ref 5–15)
AST: 35 U/L (ref 15–41)
Albumin: 3.4 g/dL — ABNORMAL LOW (ref 3.5–5.0)
Alkaline Phosphatase: 54 U/L (ref 38–126)
BUN: 10 mg/dL (ref 6–20)
CALCIUM: 8.9 mg/dL (ref 8.9–10.3)
CO2: 25 mmol/L (ref 22–32)
Chloride: 107 mmol/L (ref 101–111)
Creatinine, Ser: 0.69 mg/dL (ref 0.61–1.24)
Glucose, Bld: 97 mg/dL (ref 65–99)
Potassium: 4.2 mmol/L (ref 3.5–5.1)
SODIUM: 138 mmol/L (ref 135–145)
TOTAL PROTEIN: 6.9 g/dL (ref 6.5–8.1)
Total Bilirubin: 1.1 mg/dL (ref 0.3–1.2)

## 2015-03-09 LAB — URINALYSIS, ROUTINE W REFLEX MICROSCOPIC
Bilirubin Urine: NEGATIVE
Glucose, UA: NEGATIVE mg/dL
Hgb urine dipstick: NEGATIVE
Ketones, ur: NEGATIVE mg/dL
LEUKOCYTES UA: NEGATIVE
NITRITE: NEGATIVE
PH: 6 (ref 5.0–8.0)
Protein, ur: NEGATIVE mg/dL
SPECIFIC GRAVITY, URINE: 1.02 (ref 1.005–1.030)
Urobilinogen, UA: 1 mg/dL (ref 0.0–1.0)

## 2015-03-09 LAB — LIPASE, BLOOD: LIPASE: 16 U/L — AB (ref 22–51)

## 2015-03-09 MED ORDER — DICLOFENAC SODIUM 50 MG PO TBEC: 50.0000 mg | DELAYED_RELEASE_TABLET | Freq: Two times a day (BID) | ORAL | Status: DC | PRN

## 2015-03-09 MED ORDER — HYDROCODONE-ACETAMINOPHEN 5-325 MG PO TABS: 2.0000 | ORAL_TABLET | ORAL | Status: DC | PRN

## 2015-03-09 MED ORDER — METHOCARBAMOL 500 MG PO TABS: 500.0000 mg | ORAL_TABLET | Freq: Two times a day (BID) | ORAL | Status: DC

## 2015-03-09 NOTE — ED Notes (Signed)
Pt c/o lower left sided back pain x 4 days; pt sts some dark urine but denies dysuria

## 2015-03-09 NOTE — ED Provider Notes (Signed)
CSN: [id number]     Arrival date & time 03/09/15  1134 History  This chart was scribed for non-physician practitioner, Caryl Ada, PA-C working with Sherwood Gambler, MD by Rayna Sexton, ED scribe. This patient was seen in room TR05C/TR05C and the patient's care was started at 1:13 PM.     Chief Complaint  Patient presents with  . Back Pain    The history is provided by the patient. No language interpreter was used.    HPI Comments: Roy Martinez is a 59 y.o. male who presents to the Emergency Department complaining of generalized, mild, lower left sided back pain with onset 1 week ago. 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 Pt denies taking any pain medications PTA and additionally denies taking any prescription medications. He also notes an allergy to penicillin. He denies ever seeing an orthopedist. Pt denies any radiation of pain or other associated symptoms.   Pt additionally notes regularly drinking about 8 beers per night for the last few years and was curious about the symptoms regarding cirrhosis of the liver. He notes recent dark urination but denies any dysuria. He additionally requested blood work due to his concern.   History reviewed. No pertinent past medical history. Past Surgical History  Procedure Laterality Date  . Knee arthroscopy     Family History  Problem Relation Age of Onset  . Heart failure Mother   . Heart failure Father    History  Substance Use Topics  . Smoking status: Current Every Day Smoker -- 0.50 packs/day    Types: Cigarettes  . Smokeless tobacco: Not on file  . Alcohol Use: Yes     Comment: 2 quarts beer everyday    Review of Systems  Genitourinary: Negative for dysuria.  Musculoskeletal: Positive for myalgias and back pain.  All other systems reviewed and are negative.     Allergies  Penicillins  Home Medications   Prior to Admission medications   Medication Sig Start  Date End Date Taking? Authorizing Provider  cyclobenzaprine (FLEXERIL) 10 MG tablet Take 1 tablet (10 mg total) by mouth at bedtime as needed for muscle spasms. 08/11/14   Domenic Moras, PA-C  diclofenac (VOLTAREN) 50 MG EC tablet Take 1 tablet (50 mg total) by mouth 2 (two) times daily as needed for mild pain. 08/11/14   Domenic Moras, PA-C  traMADol (ULTRAM) 50 MG tablet Take 1 tablet (50 mg total) by mouth every 6 (six) hours as needed. 08/11/14   Domenic Moras, PA-C   BP 123/84 mmHg  Pulse 52  Temp(Src) 98.2 F (36.8 C) (Oral)  Resp 18  SpO2 100% Physical Exam  Constitutional: He is oriented to person, place, and time. He appears well-developed and well-nourished. No distress.  HENT:  Head: Normocephalic and atraumatic.  Mouth/Throat: Oropharynx is clear and moist.  Eyes: Conjunctivae and EOM are normal. Pupils are equal, round, and reactive to light.  Neck: Normal range of motion. Neck supple. No tracheal deviation present.  Cardiovascular: Normal rate.   Pulmonary/Chest: Breath sounds normal. No respiratory distress.  Abdominal: Soft.  Musculoskeletal: He exhibits tenderness.  Bilateral lower lumbar tenderness  Neurological: He is alert and oriented to person, place, and time.  Skin: Skin is warm and dry.  Psychiatric: He has a normal mood and affect. His behavior is normal.  Nursing note and vitals reviewed.   ED Course  Procedures  DIAGNOSTIC STUDIES: Oxygen Saturation is 100% on RA, normal by  my interpretation.    COORDINATION OF CARE: 1:16 PM Discussed treatment plan with pt at bedside and pt agreed to plan.  Labs Review Labs Reviewed - No data to display  Imaging Review No results found.   EKG Interpretation None      MDM   Final diagnoses:  Low back pain without sciatica, unspecified back pain laterality    Hydrocodone Robaxin AVS   Fransico Meadow, PA-C 03/09/15 [place], PA-C 03/09/15 [place], MD 03/10/15 [phone]

## 2015-03-09 NOTE — Discharge Instructions (Signed)
Back Pain, Adult Low back pain is very common. About 1 in 5 people have back pain.The cause of low back pain is rarely dangerous. The pain often gets better over time.About half of people with a sudden onset of back pain feel better in just 2 weeks. About 8 in 10 people feel better by 6 weeks.  CAUSES Some common causes of back pain include:  Strain of the muscles or ligaments supporting the spine.  Wear and tear (degeneration) of the spinal discs.  Arthritis.  Direct injury to the back. DIAGNOSIS Most of the time, the direct cause of low back pain is not known.However, back pain can be treated effectively even when the exact cause of the pain is unknown.Answering your caregiver's questions about your overall health and symptoms is one of the most accurate ways to make sure the cause of your pain is not dangerous. If your caregiver needs more information, he or she may order lab work or imaging tests (X-rays or MRIs).However, even if imaging tests show changes in your back, this usually does not require surgery. HOME CARE INSTRUCTIONS For many people, back pain returns.Since low back pain is rarely dangerous, it is often a condition that people can learn to manageon their own.   Remain active. It is stressful on the back to sit or stand in one place. Do not sit, drive, or stand in one place for more than 30 minutes at a time. Take short walks on level surfaces as soon as pain allows.Try to increase the length of time you walk each day.  Do not stay in bed.Resting more than 1 or 2 days can delay your recovery.  Do not avoid exercise or work.Your body is made to move.It is not dangerous to be active, even though your back may hurt.Your back will likely heal faster if you return to being active before your pain is gone.  Pay attention to your body when you bend and lift. Many people have less discomfortwhen lifting if they bend their knees, keep the load close to their bodies,and  avoid twisting. Often, the most comfortable positions are those that put less stress on your recovering back.  Find a comfortable position to sleep. Use a firm mattress and lie on your side with your knees slightly bent. If you lie on your back, put a pillow under your knees.  Only take over-the-counter or prescription medicines as directed by your caregiver. Over-the-counter medicines to reduce pain and inflammation are often the most helpful.Your caregiver may prescribe muscle relaxant drugs.These medicines help dull your pain so you can more quickly return to your normal activities and healthy exercise.  Put ice on the injured area.  Put ice in a plastic bag.  Place a towel between your skin and the bag.  Leave the ice on for 15-20 minutes, 03-04 times a day for the first 2 to 3 days. After that, ice and heat may be alternated to reduce pain and spasms.  Ask your caregiver about trying back exercises and gentle massage. This may be of some benefit.  Avoid feeling anxious or stressed.Stress increases muscle tension and can worsen back pain.It is important to recognize when you are anxious or stressed and learn ways to manage it.Exercise is a great option. SEEK MEDICAL CARE IF:  You have pain that is not relieved with rest or medicine.  You have pain that does not improve in 1 week.  You have new symptoms.  You are generally not feeling well. SEEK   IMMEDIATE MEDICAL CARE IF:   You have pain that radiates from your back into your legs.  You develop new bowel or bladder control problems.  You have unusual weakness or numbness in your arms or legs.  You develop nausea or vomiting.  You develop abdominal pain.  You feel faint. Document Released: 09/10/2005 Document Revised: 03/11/2012 Document Reviewed: 01/12/2014 [employer] Patient Information 2015 [employer]. This information is not intended to replace advice given to you by your health care provider. Make sure you  discuss any questions you have with your health care provider.  

## 2015-09-06 ENCOUNTER — Encounter (HOSPITAL_COMMUNITY): Payer: Self-pay | Admitting: Emergency Medicine

## 2015-09-06 ENCOUNTER — Emergency Department (HOSPITAL_COMMUNITY)
Admission: EM | Admit: 2015-09-06 | Discharge: 2015-09-06 | Discharge status: Against Medical Advice | Payer: Medicare Other | Attending: Emergency Medicine | Admitting: Emergency Medicine

## 2015-09-06 ENCOUNTER — Emergency Department (HOSPITAL_COMMUNITY)
Admission: EM | Admit: 2015-09-06 | Discharge: 2015-09-06 | Disposition: A | Discharge status: Home / Self Care | Payer: Medicare Other | Attending: Emergency Medicine | Admitting: Emergency Medicine

## 2015-09-06 DIAGNOSIS — M436 Torticollis: Secondary | ICD-10-CM | POA: Insufficient documentation

## 2015-09-06 DIAGNOSIS — F17210 Nicotine dependence, cigarettes, uncomplicated: Secondary | ICD-10-CM | POA: Insufficient documentation

## 2015-09-06 DIAGNOSIS — Z880 Allergy status to penicillin: Secondary | ICD-10-CM | POA: Insufficient documentation

## 2015-09-06 DIAGNOSIS — Z79899 Other long term (current) drug therapy: Secondary | ICD-10-CM | POA: Diagnosis not present

## 2015-09-06 DIAGNOSIS — M542 Cervicalgia: Secondary | ICD-10-CM | POA: Insufficient documentation

## 2015-09-06 MED ORDER — NAPROXEN 500 MG PO TABS: 500.0000 mg | ORAL_TABLET | Freq: Two times a day (BID) | ORAL | Status: DC

## 2015-09-06 MED ORDER — KETOROLAC TROMETHAMINE 60 MG/2ML IM SOLN
60.0000 mg | Freq: Once | INTRAMUSCULAR | Status: AC
  Administered 2015-09-06: 60 mg via INTRAMUSCULAR
  Filled 2015-09-06: qty 2

## 2015-09-06 MED ORDER — OXYCODONE-ACETAMINOPHEN 5-325 MG PO TABS
1.0000 | ORAL_TABLET | Freq: Once | ORAL | Status: AC
  Administered 2015-09-06: 1 via ORAL
  Filled 2015-09-06: qty 1

## 2015-09-06 MED ORDER — CYCLOBENZAPRINE HCL 10 MG PO TABS: 10.0000 mg | ORAL_TABLET | Freq: Three times a day (TID) | ORAL | Status: DC | PRN

## 2015-09-06 MED ORDER — OXYCODONE-ACETAMINOPHEN 5-325 MG PO TABS: 1.0000 | ORAL_TABLET | Freq: Four times a day (QID) | ORAL | Status: DC | PRN

## 2015-09-06 MED ORDER — DIAZEPAM 5 MG PO TABS
5.0000 mg | ORAL_TABLET | Freq: Once | ORAL | Status: AC
  Administered 2015-09-06: 5 mg via ORAL
  Filled 2015-09-06: qty 1

## 2015-09-06 NOTE — ED Notes (Signed)
Patient reports posterior neck pain, onset today, unrelieved by OTC ibuprofen, denies any injury, denies fever or chills.  Pain increases with movement and changing positions.

## 2015-09-06 NOTE — ED Notes (Signed)
Pt. reports posterior neck pain onset today unrelieved by OTC Ibuprofen , denies injury , no fever or chills . Pain increases with movement and changing positions .

## 2015-09-06 NOTE — ED Notes (Signed)
Pt complaining of indigestion.

## 2015-09-06 NOTE — Discharge Instructions (Signed)
Acute Torticollis °Torticollis is a condition in which the muscles of the neck tighten (contract) abnormally, causing the neck to twist and the head to move into an unnatural position. Torticollis that develops suddenly is called acute torticollis. If torticollis becomes chronic and is left untreated, the face and neck can become deformed. °CAUSES °This condition may be caused by: °· Sleeping in an awkward position (common). °· Extending or twisting the neck muscles beyond their normal position. °· Infection. °In some cases, the cause may not be known. °SYMPTOMS °Symptoms of this condition include: °· An unnatural position of the head. °· Neck pain. °· A limited ability to move the neck. °· Twisting of the neck to one side. °DIAGNOSIS °This condition is diagnosed with a physical exam. You may also have imaging tests, such as an X-ray, CT scan, or MRI. °TREATMENT °Treatment for this condition involves trying to relax the neck muscles. It may include: °· Medicines or shots. °· Physical therapy. °· Surgery. This may be done in severe cases. °HOME CARE INSTRUCTIONS °· Take medicines only as directed by your health care provider. °· Do stretching exercises and massage your neck as directed by your health care provider. °· Keep all follow-up visits as directed by your health care provider. This is important. °SEEK MEDICAL CARE IF: °· You develop a fever. °SEEK IMMEDIATE MEDICAL CARE IF: °· You develop difficulty breathing. °· You develop noisy breathing (stridor). °· You start drooling. °· You have trouble swallowing or have pain with swallowing. °· You develop numbness or weakness in your hands or feet. °· You have changes in your speech, understanding, or vision. °· Your pain gets worse. °  °This information is not intended to replace advice given to you by your health care provider. Make sure you discuss any questions you have with your health care provider. °  °Document Released: 09/07/2000 Document Revised:  01/25/2015 Document Reviewed: 09/06/2014 °Elsevier Interactive Patient Education ©2016 [employer] ° °

## 2015-09-06 NOTE — ED Provider Notes (Signed)
CSN: [id number]     Arrival date & time 09/06/15  [id number] History   First MD Initiated Contact with Patient 09/06/15 0445     Chief Complaint  Patient presents with  . Neck Pain     (Consider location/radiation/quality/duration/timing/severity/associated sxs/prior Treatment) HPI  This is a 59 year old male who presents with neck pain. Patient reports that he woke up from sleep yesterday morning and noted left sided neck pain. Reports he took over-the-counter ibuprofen without relief. He currently rates his pain at 10 out of 10. It is worse with movement. He denies any headache. He denies any fevers. He reports decreased range of motion.  History reviewed. No pertinent past medical history. Past Surgical History  Procedure Laterality Date  . Knee arthroscopy     Family History  Problem Relation Age of Onset  . Heart failure Mother   . Heart failure Father    Social History  Substance Use Topics  . Smoking status: Current Every Day Smoker -- 0.50 packs/day    Types: Cigarettes  . Smokeless tobacco: None  . Alcohol Use: Yes     Comment: 2 quarts beer everyday    Review of Systems  Constitutional: Negative.  Negative for fever.  Respiratory: Negative.  Negative for chest tightness and shortness of breath.   Cardiovascular: Negative.  Negative for chest pain.  Gastrointestinal: Negative.  Negative for nausea and vomiting.  Genitourinary: Negative.   Musculoskeletal: Positive for neck pain and neck stiffness.  Neurological: Negative for weakness, numbness and headaches.  All other systems reviewed and are negative.     Allergies  Penicillins and Penicillins  Home Medications   Prior to Admission medications   Medication Sig Start Date End Date Taking? Authorizing Provider  cyclobenzaprine (FLEXERIL) 10 MG tablet Take 1 tablet (10 mg total) by mouth 3 (three) times daily as needed for muscle spasms. 09/06/15   Merryl Hacker, MD  diclofenac (VOLTAREN) 50 MG EC tablet  Take 1 tablet (50 mg total) by mouth 2 (two) times daily as needed for mild pain. 03/09/15   Fransico Meadow, PA-C  HYDROcodone-acetaminophen (NORCO/VICODIN) 5-325 MG per tablet Take 2 tablets by mouth every 4 (four) hours as needed. 03/09/15   Fransico Meadow, PA-C  methocarbamol (ROBAXIN) 500 MG tablet Take 1 tablet (500 mg total) by mouth 2 (two) times daily. 03/09/15   Fransico Meadow, PA-C  naproxen (NAPROSYN) 500 MG tablet Take 1 tablet (500 mg total) by mouth 2 (two) times daily. 09/06/15   Merryl Hacker, MD  oxyCODONE-acetaminophen (PERCOCET/ROXICET) 5-325 MG tablet Take 1 tablet by mouth every 6 (six) hours as needed for severe pain. 09/06/15   Merryl Hacker, MD  traMADol (ULTRAM) 50 MG tablet Take 1 tablet (50 mg total) by mouth every 6 (six) hours as needed. 08/11/14   Domenic Moras, PA-C   BP 120/80 mmHg  Pulse 54  Temp(Src) 97.8 F (36.6 C) (Oral)  Resp 19  SpO2 96% Physical Exam  Constitutional: He is oriented to person, place, and time. He appears well-developed and well-nourished. No distress.  HENT:  Head: Normocephalic and atraumatic.  Neck:  Torticollis noted, patient holding head slightly rotated to the left, muscle spasm noted over the left neck and posterior rhomboid, decreased range of motion of the neck, no meningismus  Cardiovascular: Normal rate, regular rhythm and normal heart sounds.   No murmur heard. Pulmonary/Chest: Effort normal and breath sounds normal. No respiratory distress. He has no wheezes.  Abdominal: Soft. There is  no tenderness.  Musculoskeletal: He exhibits no edema.  Neurological: He is alert and oriented to person, place, and time.  5 out of 5 strength bilateral upper extremities with grip, biceps, and triceps strength  Skin: Skin is warm and dry.  Psychiatric: He has a normal mood and affect.  Nursing note and vitals reviewed.   ED Course  Procedures (including critical care time) Labs Review Labs Reviewed - No data to display  Imaging  Review No results found. I have personally reviewed and evaluated these images and lab results as part of my medical decision-making.   EKG Interpretation   Date/Time:  Tuesday September 06 2015 05:36:04 EST Ventricular Rate:  56 PR Interval:  141 QRS Duration: 97 QT Interval:  442 QTC Calculation: 427 R Axis:   61 Text Interpretation:  Sinus rhythm Probable left atrial enlargement No  significant change since last tracing Confirmed by Kelin Nixon  MD, Ashari Llewellyn  [id number]) on 09/06/2015 6:16:49 AM     Medications  ketorolac (TORADOL) injection 60 mg (60 mg Intramuscular Given 09/06/15 0526)  diazepam (VALIUM) tablet 5 mg (5 mg Oral Given 09/06/15 0528)  oxyCODONE-acetaminophen (PERCOCET/ROXICET) 5-325 MG per tablet 1 tablet (1 tablet Oral Given 09/06/15 [id number])  oxyCODONE-acetaminophen (PERCOCET/ROXICET) 5-325 MG per tablet 1 tablet (1 tablet Oral Given 09/06/15 [id number])    MDM   Final diagnoses:  Torticollis, acute    Patient presents with torticollis. Likely related to sleep positioning. Otherwise nontoxic. Intact strength. Patient given Toradol and Valium. At some point I was advised by nursing that he was reporting indigestion. Screening EKG reassuring. Reports history of the same. On recheck, he continues to report pain and continues to have limited range of motion. Patient was given Percocet. Discussed with patient supportive measures at home including muscle relaxants, pain medication, and heat. Patient stated understanding.  After history, exam, and medical workup I feel the patient has been appropriately medically screened and is safe for discharge home. Pertinent diagnoses were discussed with the patient. Patient was given return precautions.     Merryl Hacker, MD 09/07/15 [phone]

## 2015-09-07 ENCOUNTER — Encounter (HOSPITAL_COMMUNITY): Payer: Self-pay | Admitting: Emergency Medicine

## 2015-09-07 ENCOUNTER — Telehealth: Payer: Self-pay | Admitting: General Practice

## 2015-09-07 ENCOUNTER — Emergency Department (HOSPITAL_COMMUNITY)
Admission: EM | Admit: 2015-09-07 | Discharge: 2015-09-07 | Disposition: A | Discharge status: Home / Self Care | Payer: Medicare Other | Attending: Emergency Medicine | Admitting: Emergency Medicine

## 2015-09-07 DIAGNOSIS — Z880 Allergy status to penicillin: Secondary | ICD-10-CM | POA: Diagnosis not present

## 2015-09-07 DIAGNOSIS — F17210 Nicotine dependence, cigarettes, uncomplicated: Secondary | ICD-10-CM | POA: Insufficient documentation

## 2015-09-07 DIAGNOSIS — R51 Headache: Secondary | ICD-10-CM | POA: Diagnosis present

## 2015-09-07 DIAGNOSIS — M542 Cervicalgia: Secondary | ICD-10-CM | POA: Insufficient documentation

## 2015-09-07 DIAGNOSIS — R110 Nausea: Secondary | ICD-10-CM | POA: Insufficient documentation

## 2015-09-07 MED ORDER — NAPROXEN 250 MG PO TABS
500.0000 mg | ORAL_TABLET | Freq: Once | ORAL | Status: AC
  Administered 2015-09-07: 500 mg via ORAL
  Filled 2015-09-07: qty 2

## 2015-09-07 MED ORDER — CYCLOBENZAPRINE HCL 10 MG PO TABS
5.0000 mg | ORAL_TABLET | Freq: Once | ORAL | Status: AC
  Administered 2015-09-07: 5 mg via ORAL
  Filled 2015-09-07: qty 1

## 2015-09-07 NOTE — ED Provider Notes (Signed)
I saw and evaluated the patient, reviewed the resident's note and I agree with the findings and plan.  Headache likely 2/2 MSK strain in right neck. Reproducible with palpation to right trapezius. Improved since he was seen yesterday.   Merrily Pew, MD 09/07/15 [phone]

## 2015-09-07 NOTE — ED Provider Notes (Signed)
CSN: [id number]     Arrival date & time 09/07/15  1319 History   First MD Initiated Contact with Patient 09/07/15 1549     Chief Complaint  Patient presents with  . Headache    Patient is a 59 y.o. male presenting with headaches. The history is provided by the patient.  Headache Location: right occipital. Radiates to:  R neck Onset quality:  Gradual Duration:  3 days Timing:  Constant Progression:  Unchanged Chronicity:  Recurrent Similar to prior headaches: yes   Relieved by:  Nothing Ineffective treatments:  None tried Associated symptoms: nausea and neck pain   Associated symptoms: no abdominal pain, no back pain, no blurred vision, no cough, no diarrhea, no dizziness, no facial pain, no fever, no focal weakness, no loss of balance, no numbness, no photophobia, no seizures, no sore throat and no weakness     History reviewed. No pertinent past medical history. Past Surgical History  Procedure Laterality Date  . Knee arthroscopy     Family History  Problem Relation Age of Onset  . Heart failure Mother   . Heart failure Father    Social History  Substance Use Topics  . Smoking status: Current Every Day Smoker -- 0.50 packs/day    Types: Cigarettes  . Smokeless tobacco: None  . Alcohol Use: Yes     Comment: 2 quarts beer everyday    Review of Systems  Constitutional: Negative for fever and chills.  HENT: Negative for rhinorrhea and sore throat.   Eyes: Negative for blurred vision, photophobia and visual disturbance.  Respiratory: Negative for cough and shortness of breath.   Cardiovascular: Negative for chest pain.  Gastrointestinal: Positive for nausea. Negative for abdominal pain, diarrhea and constipation.  Genitourinary: Negative for dysuria and hematuria.  Musculoskeletal: Positive for neck pain. Negative for back pain.  Skin: Negative for rash.  Neurological: Positive for headaches. Negative for dizziness, focal weakness, seizures, syncope, facial asymmetry,  speech difficulty, weakness, numbness and loss of balance.  Psychiatric/Behavioral: Negative for confusion.  All other systems reviewed and are negative.     Allergies  Penicillins  Home Medications   Prior to Admission medications   Medication Sig Start Date End Date Taking? Authorizing Provider  HYDROcodone-acetaminophen (NORCO/VICODIN) 5-325 MG per tablet Take 2 tablets by mouth every 4 (four) hours as needed. 03/09/15  Yes Fransico Meadow, PA-C  oxyCODONE-acetaminophen (PERCOCET/ROXICET) 5-325 MG tablet Take 1 tablet by mouth every 6 (six) hours as needed for severe pain. 09/06/15  Yes Merryl Hacker, MD  traMADol (ULTRAM) 50 MG tablet Take 1 tablet (50 mg total) by mouth every 6 (six) hours as needed. 08/11/14  Yes Domenic Moras, PA-C  cyclobenzaprine (FLEXERIL) 10 MG tablet Take 1 tablet (10 mg total) by mouth 3 (three) times daily as needed for muscle spasms. Patient not taking: Reported on 09/07/2015 09/06/15   Merryl Hacker, MD  diclofenac (VOLTAREN) 50 MG EC tablet Take 1 tablet (50 mg total) by mouth 2 (two) times daily as needed for mild pain. Patient not taking: Reported on 09/07/2015 03/09/15   Fransico Meadow, PA-C  methocarbamol (ROBAXIN) 500 MG tablet Take 1 tablet (500 mg total) by mouth 2 (two) times daily. Patient not taking: Reported on 09/07/2015 03/09/15   Fransico Meadow, PA-C  naproxen (NAPROSYN) 500 MG tablet Take 1 tablet (500 mg total) by mouth 2 (two) times daily. Patient not taking: Reported on 09/07/2015 09/06/15   Merryl Hacker, MD   BP 127/83 mmHg  Pulse 63  Temp(Src) 98.1 F (36.7 C) (Oral)  Resp 18  SpO2 97% Physical Exam  Constitutional: He is oriented to person, place, and time. He appears well-developed and well-nourished. No distress.  HENT:  Head: Normocephalic and atraumatic.  Mouth/Throat: Oropharynx is clear and moist.  Eyes: EOM are normal.  Neck: Neck supple. No JVD present.  Cardiovascular: Normal rate, regular rhythm, normal  heart sounds and intact distal pulses.   Pulmonary/Chest: Effort normal and breath sounds normal.  Abdominal: Soft. He exhibits no distension. There is no tenderness.  Musculoskeletal: Normal range of motion. He exhibits no edema.  Neurological: He is alert and oriented to person, place, and time. He has normal strength. He displays no tremor. No cranial nerve deficit or sensory deficit. He displays a negative Romberg sign. Coordination and gait normal. GCS eye subscore is 4. GCS verbal subscore is 5. GCS motor subscore is 6.  Reflex Scores:      Bicep reflexes are 2+ on the right side and 2+ on the left side.      Brachioradialis reflexes are 2+ on the right side and 2+ on the left side.      Patellar reflexes are 2+ on the right side and 2+ on the left side. Normal finger-nose exam. No pronator drift.  Skin: Skin is warm and dry.  Psychiatric: His behavior is normal.    ED Course  Procedures  NONE    MDM   Final diagnoses:  Musculoskeletal neck pain    Patient is a 59 year old male who presents with right-sided neck pain. Onset was 2 days ago. Patient does a lot of heavy lifting at work and states that the symptom onset occurred during work. He has reproducible pain to palpation of his right cervical paraspinal muscles and trapezius. Patient was seen in the ED yesterday for the same. He returns today because his daughter is concerned because there is a family history of cerebral aneurysm. Neurologic exam is unremarkable. No inches minutes. Gait and other cerebellar exam unremarkable. No facial asymmetry. Low suspicion for acute CVA. Do not feel imaging is indicated at this time. Given strict return precautions if he develops any new symptoms. We'll provide Flexeril and Motrin and have him follow up with his PCP.  Discussed with Dr. Dayna Barker.  Gustavus Bryant, MD 09/07/15 [id number]  Merrily Pew, MD 09/08/15 [phone]

## 2015-09-07 NOTE — ED Notes (Signed)
Pt sts seen here last night for same and having pain on right side of head; pt sts pain pills made him vomit

## 2015-09-07 NOTE — Telephone Encounter (Signed)
Nurse called patient, patient verified date of birth. Patient throwing up, right side of head is thumping a little bit.  When patient lays down and moves head, it hurts when he moves it up.  ED gave patient medication for muscle spasm. Patient explains it is not a muscle spasm.  Patient reports taking muscle spasm medication and woke up throwing up. Nurse made appointment for patient to be seen Friday at 9:30 in walkin clinic at [facility]. Patient explains he is worried he can not wait that long. Nurse explained importance of patient going to ED currently and coming to [facility] on Friday at 9:30 for a hospital follow up. Patient voices understanding and agrees to go to ED and come to [facility] appointment on Friday.

## 2015-09-07 NOTE — Telephone Encounter (Signed)
Patient called stating that they're experiencing right head pain and is vomiting He feels this may be signs of an aneurysm Please follow up.

## 2015-09-09 ENCOUNTER — Inpatient Hospital Stay: Payer: Self-pay

## 2015-09-28 ENCOUNTER — Emergency Department (INDEPENDENT_AMBULATORY_CARE_PROVIDER_SITE_OTHER)
Admission: EM | Admit: 2015-09-28 | Discharge: 2015-09-28 | Disposition: A | Discharge status: Home / Self Care | Payer: Medicare Other | Source: Home / Self Care | Attending: Family Medicine | Admitting: Family Medicine

## 2015-09-28 ENCOUNTER — Encounter (HOSPITAL_COMMUNITY): Payer: Self-pay | Admitting: Emergency Medicine

## 2015-09-28 DIAGNOSIS — J069 Acute upper respiratory infection, unspecified: Secondary | ICD-10-CM

## 2015-09-28 DIAGNOSIS — M542 Cervicalgia: Secondary | ICD-10-CM | POA: Diagnosis not present

## 2015-09-28 MED ORDER — CYCLOBENZAPRINE HCL 10 MG PO TABS: 10.0000 mg | ORAL_TABLET | Freq: Every day | ORAL | Status: DC

## 2015-09-28 MED ORDER — GUAIFENESIN ER 600 MG PO TB12: 600.0000 mg | ORAL_TABLET | Freq: Two times a day (BID) | ORAL | Status: DC | PRN

## 2015-09-28 MED ORDER — NAPROXEN 500 MG PO TABS: 500.0000 mg | ORAL_TABLET | Freq: Two times a day (BID) | ORAL | Status: DC

## 2015-09-28 NOTE — Discharge Instructions (Signed)
It was a pleasure to see you today.  I believe your neck pain is muscular in nature.   For this, I recommend the following:   1. Naproxen 500mg  tablets, take 1 tablet by mouth twice daily with something to eat.   2. Cyclobenzaprine 10mg  tablets, take 1 tablet by mouth at bedtime, muscle relaxant.   3. Warm compresses to your neck frequently throughout the day.   For the cold symptoms, I recommend Guaifenesin 600mg  tablets, take 1 tablet by mouth twice daily to thin mucus.   Return to the urgent care center or the emergency department if you experience the following: fevers/chills, worsening of your neck pain, extending of the neck pain to your shoulders or onset of headache; weakness in upper extremities, or with other concerns.
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
## 2015-09-28 NOTE — ED Notes (Signed)
Neck pain that started last night and complains of cough, productive cough-green phlegm.  Intermittent runny nose, denies head pain, no sore throat, no ear pain.  No fever.

## 2015-09-28 NOTE — ED Notes (Signed)
Pt called x 1 no answer

## 2016-05-28 ENCOUNTER — Encounter (HOSPITAL_COMMUNITY): Payer: Self-pay

## 2016-05-28 ENCOUNTER — Emergency Department (HOSPITAL_COMMUNITY): Payer: Medicare Other

## 2016-05-28 ENCOUNTER — Emergency Department (HOSPITAL_COMMUNITY)
Admission: EM | Admit: 2016-05-28 | Discharge: 2016-05-28 | Disposition: A | Discharge status: Home / Self Care | Payer: Medicare Other | Attending: Emergency Medicine | Admitting: Emergency Medicine

## 2016-05-28 DIAGNOSIS — S86912A Strain of unspecified muscle(s) and tendon(s) at lower leg level, left leg, initial encounter: Secondary | ICD-10-CM | POA: Diagnosis not present

## 2016-05-28 DIAGNOSIS — F17210 Nicotine dependence, cigarettes, uncomplicated: Secondary | ICD-10-CM | POA: Insufficient documentation

## 2016-05-28 DIAGNOSIS — M25561 Pain in right knee: Secondary | ICD-10-CM | POA: Diagnosis not present

## 2016-05-28 DIAGNOSIS — S8991XA Unspecified injury of right lower leg, initial encounter: Secondary | ICD-10-CM | POA: Diagnosis not present

## 2016-05-28 DIAGNOSIS — Y929 Unspecified place or not applicable: Secondary | ICD-10-CM | POA: Insufficient documentation

## 2016-05-28 DIAGNOSIS — S76112A Strain of left quadriceps muscle, fascia and tendon, initial encounter: Secondary | ICD-10-CM | POA: Diagnosis not present

## 2016-05-28 DIAGNOSIS — Y999 Unspecified external cause status: Secondary | ICD-10-CM | POA: Diagnosis not present

## 2016-05-28 DIAGNOSIS — S86812A Strain of other muscle(s) and tendon(s) at lower leg level, left leg, initial encounter: Secondary | ICD-10-CM

## 2016-05-28 DIAGNOSIS — Y939 Activity, unspecified: Secondary | ICD-10-CM | POA: Diagnosis not present

## 2016-05-28 DIAGNOSIS — S8992XA Unspecified injury of left lower leg, initial encounter: Secondary | ICD-10-CM | POA: Diagnosis not present

## 2016-05-28 DIAGNOSIS — S86811A Strain of other muscle(s) and tendon(s) at lower leg level, right leg, initial encounter: Secondary | ICD-10-CM

## 2016-05-28 DIAGNOSIS — T7411XA Adult physical abuse, confirmed, initial encounter: Secondary | ICD-10-CM | POA: Diagnosis not present

## 2016-05-28 DIAGNOSIS — S76111A Strain of right quadriceps muscle, fascia and tendon, initial encounter: Secondary | ICD-10-CM | POA: Diagnosis not present

## 2016-05-28 DIAGNOSIS — S86911A Strain of unspecified muscle(s) and tendon(s) at lower leg level, right leg, initial encounter: Secondary | ICD-10-CM | POA: Diagnosis not present

## 2016-05-28 DIAGNOSIS — M25562 Pain in left knee: Secondary | ICD-10-CM | POA: Diagnosis not present

## 2016-05-28 MED ORDER — OXYCODONE-ACETAMINOPHEN 5-325 MG PO TABS
1.0000 | ORAL_TABLET | Freq: Once | ORAL | Status: AC
  Administered 2016-05-28: 1 via ORAL
  Filled 2016-05-28: qty 1

## 2016-05-28 MED ORDER — OXYCODONE-ACETAMINOPHEN 5-325 MG PO TABS: 1.0000 | ORAL_TABLET | Freq: Four times a day (QID) | ORAL | 0 refills | Status: DC | PRN

## 2016-05-28 MED ORDER — DICLOFENAC SODIUM 50 MG PO TBEC: 50.0000 mg | DELAYED_RELEASE_TABLET | Freq: Two times a day (BID) | ORAL | 0 refills | Status: DC

## 2016-05-28 MED ORDER — IBUPROFEN 400 MG PO TABS
800.0000 mg | ORAL_TABLET | Freq: Once | ORAL | Status: AC
  Administered 2016-05-28: 800 mg via ORAL
  Filled 2016-05-28: qty 2

## 2016-05-28 MED ORDER — ONDANSETRON 4 MG PO TBDP
4.0000 mg | ORAL_TABLET | Freq: Once | ORAL | Status: AC
  Administered 2016-05-28: 4 mg via ORAL
  Filled 2016-05-28: qty 1

## 2016-05-28 NOTE — Discharge Instructions (Signed)
Call Dr. Trevor Mace office tomorrow for follow up.
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
## 2016-05-28 NOTE — ED Notes (Signed)
Patient in xray 

## 2016-05-28 NOTE — ED Notes (Signed)
Patient returned from x ray and assisted into chair from wheelchair.
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
## 2016-05-28 NOTE — ED Notes (Addendum)
Ortho paged to place knee immobilizer

## 2016-05-29 DIAGNOSIS — M25561 Pain in right knee: Secondary | ICD-10-CM | POA: Diagnosis not present

## 2016-05-29 DIAGNOSIS — M25461 Effusion, right knee: Secondary | ICD-10-CM | POA: Diagnosis not present

## 2016-06-02 ENCOUNTER — Other Ambulatory Visit: Payer: Self-pay | Admitting: Orthopedic Surgery

## 2016-06-02 DIAGNOSIS — M25561 Pain in right knee: Secondary | ICD-10-CM

## 2016-06-09 ENCOUNTER — Ambulatory Visit
Admission: RE | Admit: 2016-06-09 | Discharge: 2016-06-09 | Disposition: A | Discharge status: Home / Self Care | Payer: Medicare Other | Source: Ambulatory Visit | Attending: Orthopedic Surgery | Admitting: Orthopedic Surgery

## 2016-06-09 DIAGNOSIS — M25561 Pain in right knee: Secondary | ICD-10-CM

## 2016-06-13 ENCOUNTER — Emergency Department (HOSPITAL_COMMUNITY)
Admission: EM | Admit: 2016-06-13 | Discharge: 2016-06-13 | Disposition: A | Discharge status: Home / Self Care | Payer: Medicare Other | Attending: Emergency Medicine | Admitting: Emergency Medicine

## 2016-06-13 ENCOUNTER — Encounter (HOSPITAL_COMMUNITY): Payer: Self-pay

## 2016-06-13 DIAGNOSIS — F17210 Nicotine dependence, cigarettes, uncomplicated: Secondary | ICD-10-CM | POA: Insufficient documentation

## 2016-06-13 DIAGNOSIS — M25561 Pain in right knee: Secondary | ICD-10-CM | POA: Insufficient documentation

## 2016-06-13 DIAGNOSIS — N390 Urinary tract infection, site not specified: Secondary | ICD-10-CM | POA: Diagnosis not present

## 2016-06-13 DIAGNOSIS — M79604 Pain in right leg: Secondary | ICD-10-CM | POA: Diagnosis not present

## 2016-06-13 DIAGNOSIS — R1030 Lower abdominal pain, unspecified: Secondary | ICD-10-CM | POA: Diagnosis present

## 2016-06-13 LAB — URINALYSIS, ROUTINE W REFLEX MICROSCOPIC
GLUCOSE, UA: NEGATIVE mg/dL
HGB URINE DIPSTICK: NEGATIVE
KETONES UR: NEGATIVE mg/dL
NITRITE: POSITIVE — AB
PH: 6 (ref 5.0–8.0)
Protein, ur: NEGATIVE mg/dL
SPECIFIC GRAVITY, URINE: 1.025 (ref 1.005–1.030)

## 2016-06-13 LAB — URINE MICROSCOPIC-ADD ON: Bacteria, UA: NONE SEEN

## 2016-06-13 MED ORDER — OXYCODONE-ACETAMINOPHEN 5-325 MG PO TABS
1.0000 | ORAL_TABLET | Freq: Once | ORAL | Status: AC
  Administered 2016-06-13: 1 via ORAL
  Filled 2016-06-13: qty 1

## 2016-06-13 MED ORDER — SULFAMETHOXAZOLE-TRIMETHOPRIM 800-160 MG PO TABS: 1.0000 | ORAL_TABLET | Freq: Two times a day (BID) | ORAL | 0 refills | Status: AC

## 2016-06-13 NOTE — Discharge Instructions (Signed)
Please take all of your antibiotics until finished! Keep your scheduled appointment with the orthopedic physician.  Stay very well hydrated with plenty of water throughout the day. Follow up with primary care physician in 1 week for recheck of ongoing symptoms. Return to ER for new or worsening symptoms, any additional concerns.  Please seek immediate care if you develop the following: Your symptoms are no better or worse in 3 days. There is severe back pain or lower abdominal pain.  You develop chills.  You have a fever.  There is nausea or vomiting.  There is continued burning or discomfort with urination.

## 2016-06-13 NOTE — ED Notes (Signed)
[facility] e-visit coupon given

## 2016-06-13 NOTE — ED Provider Notes (Signed)
[facility] DEPT Provider Note   CSN: [id number] Arrival date & time: 06/13/16  1127  By signing my name below, I, Sonum Patel, attest that this documentation has been prepared under the direction and in the presence of [facility], PA-C. Electronically Signed: Ludger Nutting, Scribe. 06/13/16. 1:44 PM.  History   Chief Complaint Chief Complaint  Patient presents with  . Dysuria  . Knee Pain    The history is provided by the patient. No language interpreter was used.     HPI Comments: Roy Martinez is a 60 y.o. male who presents to the Emergency Department complaining of constant, unchanged suprapubic abdominal pain that began yesterday. He reports associated dark urine. He denies frequency, dysuria, urinary urgency/frequency, penile discharge or fever.  He also complains of right knee pain that has been ongoing for over 2 weeks. He was seen on 05/28/16 at [facility] ED and was diagnosed with a patellar tendon rupture; he was discharged home with crutches, Voltaren tablets, and advised to follow up with ortho. He followed up with Dr. Marlou Sa and states he has an additional follow up appointment with ortho is 06/20/16. Dr. Marlou Sa gave him rx for pain medication which he has been taking with moderate relief.   History reviewed. No pertinent past medical history.  There are no active problems to display for this patient.   Past Surgical History:  Procedure Laterality Date  . KNEE ARTHROSCOPY       Home Medications    Prior to Admission medications   Medication Sig Start Date End Date Taking? Authorizing Provider  cyclobenzaprine (FLEXERIL) 10 MG tablet Take 1 tablet (10 mg total) by mouth at bedtime. 09/28/15   Willeen Niece, MD  diclofenac (VOLTAREN) 50 MG EC tablet Take 1 tablet (50 mg total) by mouth 2 (two) times daily. 05/28/16   Hope Bunnie Pion, NP  guaiFENesin (MUCINEX) 600 MG 12 hr tablet Take 1 tablet (600 mg total) by mouth 2 (two) times daily as needed. 09/28/15   Willeen Niece, MD    HYDROcodone-acetaminophen (NORCO/VICODIN) 5-325 MG per tablet Take 2 tablets by mouth every 4 (four) hours as needed. 03/09/15   Fransico Meadow, PA-C  naproxen (NAPROSYN) 500 MG tablet Take 1 tablet (500 mg total) by mouth 2 (two) times daily with a meal. 09/28/15   Willeen Niece, MD  oxyCODONE-acetaminophen (ROXICET) 5-325 MG tablet Take 1 tablet by mouth every 6 (six) hours as needed for severe pain. 05/28/16   Hope Bunnie Pion, NP  sulfamethoxazole-trimethoprim (BACTRIM DS,SEPTRA DS) 800-160 MG tablet Take 1 tablet by mouth 2 (two) times daily. 06/13/16 06/20/16  Ozella Almond Kohler Pellerito, PA-C  traMADol (ULTRAM) 50 MG tablet Take 1 tablet (50 mg total) by mouth every 6 (six) hours as needed. 08/11/14   Domenic Moras, PA-C    Family History Family History  Problem Relation Age of Onset  . Heart failure Mother   . Heart failure Father     Social History Social History  Substance Use Topics  . Smoking status: Current Every Day Smoker    Packs/day: 0.50    Types: Cigarettes  . Smokeless tobacco: Never Used  . Alcohol use Yes     Comment: 2 quarts beer everyday     Allergies   Penicillins   Review of Systems Review of Systems  Constitutional: Negative for fever.  Gastrointestinal: Positive for abdominal pain. Negative for constipation, diarrhea, nausea and vomiting.  Genitourinary: Negative for discharge, dysuria, frequency, penile pain, penile swelling, scrotal swelling  and testicular pain.       +dark urine  Musculoskeletal: Positive for arthralgias.     Physical Exam Updated Vital Signs BP 179/89 (BP Location: Right Arm)   Pulse 60   Temp 98.2 F (36.8 C) (Oral)   Resp 20   Ht 6\' 1"  (1.854 m)   Wt 83.9 kg   SpO2 100%   BMI 24.41 kg/m   Physical Exam  Constitutional: He is oriented to person, place, and time. He appears well-developed and well-nourished. No distress.  HENT:  Head: Normocephalic and atraumatic.  Cardiovascular: Normal rate, regular rhythm, normal heart sounds  and intact distal pulses.  Exam reveals no gallop and no friction rub.   No murmur heard. Pulmonary/Chest: Effort normal and breath sounds normal. No respiratory distress. He has no wheezes. He has no rales. He exhibits no tenderness.  Abdominal: Soft. Bowel sounds are normal. He exhibits no distension. There is tenderness (Suprapubic).  No CVA tenderness.   Musculoskeletal: He exhibits no edema.  2+ DP pulse and sensation intact.  Decreased ROM. 5/5 muscle strength. TTP of generalized right knee.   Neurological: He is alert and oriented to person, place, and time.  Skin: Skin is warm and dry.  Nursing note and vitals reviewed.    ED Treatments / Results  DIAGNOSTIC STUDIES: Oxygen Saturation is 100% on RA, normal by my interpretation.    COORDINATION OF CARE: 1:44 PM Discussed treatment plan with pt at bedside and pt agreed to plan.    Labs (all labs ordered are listed, but only abnormal results are displayed) Labs Reviewed  URINALYSIS, ROUTINE W REFLEX MICROSCOPIC (NOT AT [facility]) - Abnormal; Notable for the following:       Result Value   Color, Urine ORANGE (*)    Bilirubin Urine SMALL (*)    Nitrite POSITIVE (*)    Leukocytes, UA SMALL (*)    All other components within normal limits  URINE MICROSCOPIC-ADD ON - Abnormal; Notable for the following:    Squamous Epithelial / LPF 0-5 (*)    All other components within normal limits  URINE CULTURE    EKG  EKG Interpretation None       Radiology No results found.  Procedures Procedures (including critical care time)  Medications Ordered in ED Medications  oxyCODONE-acetaminophen (PERCOCET/ROXICET) 5-325 MG per tablet 1 tablet (1 tablet Oral Given 06/13/16 1326)     Initial Impression / Assessment and Plan / ED Course  I have reviewed the triage vital signs and the nursing notes.  Pertinent labs & imaging results that were available during my care of the patient were reviewed by me and considered in my medical  decision making (see chart for details).  Clinical Course   Roy Martinez presents to ED with two complaints:  1. Lower abdominal pain and dark foul smelling urine. On exam, non-surgical abdomen. + suprapubic tenderness. UA with nitrite positive urine, small WBC's. Will send for cx and treat with bactrim. PCP follow up in 2-3 days.   2. Persistent knee pain after known patellar tendon rupture. Patient followed by Dr. Marlou Sa, ortho, and has had MRI performed. He has a follow up with orthopedics already in 1 week. Compartments are soft. Patient seems confused on the use of his brace - ortho tech came down and discussed how to apply brace. Patient feels more confident with brace after this. Keep scheduled ortho appointment.   Reasons to return to ED discussed. All questions answered.   Final Clinical Impressions(s) /  ED Diagnoses   Final diagnoses:  UTI (lower urinary tract infection)  Leg pain, right    New Prescriptions Discharge Medication List as of 06/13/2016  2:47 PM    START taking these medications   Details  sulfamethoxazole-trimethoprim (BACTRIM DS,SEPTRA DS) 800-160 MG tablet Take 1 tablet by mouth 2 (two) times daily., Starting Wed 06/13/2016, Until Wed 06/20/2016, Print       I personally performed the services described in this documentation, which was scribed in my presence. The recorded information has been reviewed and is accurate.    [facility] Daphnie Venturini, PA-C 06/13/16 [place], MD 06/14/16 [phone]

## 2016-06-13 NOTE — ED Triage Notes (Signed)
Pt reports he has right knee pain as well as dark urine. He reports ongoing dysuria and knee pain for more than 2 weeks. Pt has crutches with him and brace on the right knee.

## 2016-06-20 ENCOUNTER — Other Ambulatory Visit: Payer: Self-pay | Admitting: Orthopedic Surgery

## 2016-06-20 ENCOUNTER — Other Ambulatory Visit (INDEPENDENT_AMBULATORY_CARE_PROVIDER_SITE_OTHER): Payer: Self-pay | Admitting: Orthopedic Surgery

## 2016-06-20 DIAGNOSIS — S83241D Other tear of medial meniscus, current injury, right knee, subsequent encounter: Secondary | ICD-10-CM | POA: Diagnosis not present

## 2016-06-20 DIAGNOSIS — S86811D Strain of other muscle(s) and tendon(s) at lower leg level, right leg, subsequent encounter: Secondary | ICD-10-CM | POA: Diagnosis not present

## 2016-06-21 ENCOUNTER — Other Ambulatory Visit: Payer: Self-pay | Admitting: Orthopedic Surgery
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
## 2016-06-25 ENCOUNTER — Encounter (HOSPITAL_COMMUNITY)
Admission: RE | Admit: 2016-06-25 | Discharge: 2016-06-25 | Disposition: A | Discharge status: Home / Self Care | Payer: Medicare Other | Source: Ambulatory Visit | Attending: Orthopedic Surgery | Admitting: Orthopedic Surgery

## 2016-06-25 ENCOUNTER — Encounter (HOSPITAL_COMMUNITY): Payer: Self-pay

## 2016-06-25 DIAGNOSIS — M65861 Other synovitis and tenosynovitis, right lower leg: Secondary | ICD-10-CM | POA: Diagnosis not present

## 2016-06-25 DIAGNOSIS — W1830XA Fall on same level, unspecified, initial encounter: Secondary | ICD-10-CM | POA: Diagnosis not present

## 2016-06-25 DIAGNOSIS — M23221 Derangement of posterior horn of medial meniscus due to old tear or injury, right knee: Secondary | ICD-10-CM | POA: Diagnosis not present

## 2016-06-25 DIAGNOSIS — Z880 Allergy status to penicillin: Secondary | ICD-10-CM | POA: Diagnosis not present

## 2016-06-25 DIAGNOSIS — M25461 Effusion, right knee: Secondary | ICD-10-CM | POA: Diagnosis not present

## 2016-06-25 DIAGNOSIS — F17210 Nicotine dependence, cigarettes, uncomplicated: Secondary | ICD-10-CM | POA: Diagnosis not present

## 2016-06-25 DIAGNOSIS — M2241 Chondromalacia patellae, right knee: Secondary | ICD-10-CM | POA: Diagnosis not present

## 2016-06-25 DIAGNOSIS — M23261 Derangement of other lateral meniscus due to old tear or injury, right knee: Secondary | ICD-10-CM | POA: Diagnosis not present

## 2016-06-25 DIAGNOSIS — S76191A Other specified injury of right quadriceps muscle, fascia and tendon, initial encounter: Secondary | ICD-10-CM | POA: Diagnosis not present

## 2016-06-25 LAB — CBC
HCT: 40.5 % (ref 39.0–52.0)
Hemoglobin: 12.9 g/dL — ABNORMAL LOW (ref 13.0–17.0)
MCH: 29.5 pg (ref 26.0–34.0)
MCHC: 31.9 g/dL (ref 30.0–36.0)
MCV: 92.5 fL (ref 78.0–100.0)
PLATELETS: 604 10*3/uL — AB (ref 150–400)
RBC: 4.38 MIL/uL (ref 4.22–5.81)
RDW: 12.6 % (ref 11.5–15.5)
WBC: 6.5 10*3/uL (ref 4.0–10.5)

## 2016-06-25 LAB — COMPREHENSIVE METABOLIC PANEL
ALT: 26 U/L (ref 17–63)
AST: 29 U/L (ref 15–41)
Albumin: 3.5 g/dL (ref 3.5–5.0)
Alkaline Phosphatase: 87 U/L (ref 38–126)
Anion gap: 10 (ref 5–15)
BUN: 12 mg/dL (ref 6–20)
CHLORIDE: 101 mmol/L (ref 101–111)
CO2: 25 mmol/L (ref 22–32)
CREATININE: 0.68 mg/dL (ref 0.61–1.24)
Calcium: 9.7 mg/dL (ref 8.9–10.3)
GFR calc non Af Amer: >60 mL/min (ref >60)
Glucose, Bld: 110 mg/dL — ABNORMAL HIGH (ref 65–99)
Potassium: 4 mmol/L (ref 3.5–5.1)
SODIUM: 136 mmol/L (ref 135–145)
Total Bilirubin: 0.8 mg/dL (ref 0.3–1.2)
Total Protein: 8.8 g/dL — ABNORMAL HIGH (ref 6.5–8.1)

## 2016-06-25 LAB — URINALYSIS, ROUTINE W REFLEX MICROSCOPIC
Glucose, UA: NEGATIVE mg/dL
HGB URINE DIPSTICK: NEGATIVE
Ketones, ur: 15 mg/dL — AB
Leukocytes, UA: NEGATIVE
Nitrite: NEGATIVE
PH: 6 (ref 5.0–8.0)
Protein, ur: NEGATIVE mg/dL
SPECIFIC GRAVITY, URINE: 1.025 (ref 1.005–1.030)

## 2016-06-25 NOTE — Progress Notes (Signed)
PCP - denies Cardiologist - denies  EKG - 09/05/16 CXR - denies Echo/stress test/cardiac cath - denies  Patient denies chest pain and shortness of breath at PAT appointment.  Patient informs nurse that he did complete his course of Bactrim but urine is still the same color.

## 2016-06-25 NOTE — Progress Notes (Signed)
Notified Kim at Dr. Randel Pigg office of recent abnormal urine and that patient was started on Bactrim.  Per Maudie Mercury, repeat UA at PAT appointment.

## 2016-06-25 NOTE — Pre-Procedure Instructions (Addendum)
Roy Martinez  06/25/2016      [facility] [address] [place] [id number] [place] [place] [address] Phone: [phone] Fax: [phone]    Your procedure is scheduled on Tuesday, October 3rd, 2017.  Report to [facility] Admitting at 9:15 A.M.   Call this number if you have problems the morning of surgery:  [phone]   Remember:  Do not eat food or drink liquids after midnight.   Take these medicines the morning of surgery with A SIP OF WATER: Oxycodone-acetaminophen if needed, Tramadol (Ultram) if needed.  Stop taking: Diclofenac (Voltaren), Naproxen (Naprosyn), Aleve, Aspirin, NSAIDS, Ibuprofen, Advil, Motrin, BC's, Goody's, Fish oil, all herbal medications, and all vitamins.    Do not wear jewelry.  Do not wear lotions, powders, or colognes, or deoderant.  Men may shave face and neck.  Do not bring valuables to the hospital.  [facility] is not responsible for any belongings or valuables.  Contacts, dentures or bridgework may not be worn into surgery.  Leave your suitcase in the car.  After surgery it may be brought to your room.  For patients admitted to the hospital, discharge time will be determined by your treatment team.  Patients discharged the day of surgery will not be allowed to drive home.   Special instructions:  Preparing for Surgery.   Please read over the following fact sheets that you were given.   [facility]- Preparing For Surgery  Before surgery, you can play an important role. Because skin is not sterile, your skin needs to be as free of germs as possible. You can reduce the number of germs on your skin by washing with CHG (chlorahexidine gluconate) Soap before surgery.  CHG is an antiseptic cleaner which kills germs and bonds with the skin to continue killing germs even after washing.  Please do not use if you have an allergy to CHG or antibacterial soaps. If your skin becomes  reddened/irritated stop using the CHG.  Do not shave (including legs and underarms) for at least 48 hours prior to first CHG shower. It is OK to shave your face.  Please follow these instructions carefully.   1. Shower the NIGHT BEFORE SURGERY and the MORNING OF SURGERY with CHG.   2. If you chose to wash your hair, wash your hair first as usual with your normal shampoo.  3. After you shampoo, rinse your hair and body thoroughly to remove the shampoo.  4. Use CHG as you would any other liquid soap. You can apply CHG directly to the skin and wash gently with a scrungie or a clean washcloth.   5. Apply the CHG Soap to your body ONLY FROM THE NECK DOWN.  Do not use on open wounds or open sores. Avoid contact with your eyes, ears, mouth and genitals (private parts). Wash genitals (private parts) with your normal soap.  6. Wash thoroughly, paying special attention to the area where your surgery will be performed.  7. Thoroughly rinse your body with warm water from the neck down.  8. DO NOT shower/wash with your normal soap after using and rinsing off the CHG Soap.  9. Pat yourself dry with a CLEAN TOWEL.   10. Wear CLEAN PAJAMAS   11. Place CLEAN SHEETS on your bed the night of your first shower and DO NOT SLEEP WITH PETS.  Day of Surgery: Do not apply any deodorants/lotions. Please wear clean clothes to the hospital/surgery center.

## 2016-06-26 ENCOUNTER — Ambulatory Visit (HOSPITAL_COMMUNITY): Payer: Medicare Other | Admitting: Certified Registered Nurse Anesthetist

## 2016-06-26 ENCOUNTER — Ambulatory Visit (HOSPITAL_COMMUNITY): Payer: Medicare Other | Admitting: Emergency Medicine

## 2016-06-26 ENCOUNTER — Encounter (HOSPITAL_COMMUNITY): Payer: Self-pay | Admitting: Surgery

## 2016-06-26 ENCOUNTER — Observation Stay (HOSPITAL_COMMUNITY)
Admission: RE | Admit: 2016-06-26 | Discharge: 2016-06-27 | Disposition: A | Discharge status: Home / Self Care | Payer: Medicare Other | Source: Ambulatory Visit | Attending: Orthopedic Surgery | Admitting: Orthopedic Surgery

## 2016-06-26 ENCOUNTER — Encounter (HOSPITAL_COMMUNITY)
Admission: RE | Disposition: A | Discharge status: Home / Self Care | Payer: Self-pay | Source: Ambulatory Visit | Attending: Orthopedic Surgery

## 2016-06-26 DIAGNOSIS — S76191A Other specified injury of right quadriceps muscle, fascia and tendon, initial encounter: Secondary | ICD-10-CM | POA: Diagnosis not present

## 2016-06-26 DIAGNOSIS — M65861 Other synovitis and tenosynovitis, right lower leg: Secondary | ICD-10-CM | POA: Diagnosis not present

## 2016-06-26 DIAGNOSIS — M2241 Chondromalacia patellae, right knee: Secondary | ICD-10-CM | POA: Insufficient documentation

## 2016-06-26 DIAGNOSIS — Z880 Allergy status to penicillin: Secondary | ICD-10-CM | POA: Diagnosis not present

## 2016-06-26 DIAGNOSIS — S83241S Other tear of medial meniscus, current injury, right knee, sequela: Secondary | ICD-10-CM | POA: Diagnosis not present

## 2016-06-26 DIAGNOSIS — S86811A Strain of other muscle(s) and tendon(s) at lower leg level, right leg, initial encounter: Secondary | ICD-10-CM | POA: Diagnosis not present

## 2016-06-26 DIAGNOSIS — M23221 Derangement of posterior horn of medial meniscus due to old tear or injury, right knee: Principal | ICD-10-CM | POA: Insufficient documentation

## 2016-06-26 DIAGNOSIS — S838X1S Sprain of other specified parts of right knee, sequela: Secondary | ICD-10-CM | POA: Diagnosis not present

## 2016-06-26 DIAGNOSIS — M23261 Derangement of other lateral meniscus due to old tear or injury, right knee: Secondary | ICD-10-CM | POA: Diagnosis not present

## 2016-06-26 DIAGNOSIS — S83241A Other tear of medial meniscus, current injury, right knee, initial encounter: Secondary | ICD-10-CM | POA: Diagnosis not present

## 2016-06-26 DIAGNOSIS — G8918 Other acute postprocedural pain: Secondary | ICD-10-CM | POA: Diagnosis not present

## 2016-06-26 DIAGNOSIS — M25461 Effusion, right knee: Secondary | ICD-10-CM | POA: Insufficient documentation

## 2016-06-26 DIAGNOSIS — F17210 Nicotine dependence, cigarettes, uncomplicated: Secondary | ICD-10-CM | POA: Insufficient documentation

## 2016-06-26 DIAGNOSIS — W1830XA Fall on same level, unspecified, initial encounter: Secondary | ICD-10-CM | POA: Insufficient documentation

## 2016-06-26 HISTORY — PX: PATELLAR TENDON REPAIR: SHX737

## 2016-06-26 HISTORY — PX: KNEE ARTHROSCOPY WITH MENISCAL REPAIR: SHX5653

## 2016-06-26 SURGERY — REPAIR, TENDON, PATELLAR
Anesthesia: Regional | Site: Knee | Laterality: Right

## 2016-06-26 MED ORDER — CHLORHEXIDINE GLUCONATE 4 % EX LIQD: 60.0000 mL | Freq: Once | CUTANEOUS | Status: DC

## 2016-06-26 MED ORDER — CLONIDINE HCL (ANALGESIA) 100 MCG/ML EP SOLN
150.0000 ug | EPIDURAL | Status: DC
  Filled 2016-06-26: qty 1.5

## 2016-06-26 MED ORDER — METOCLOPRAMIDE HCL 5 MG/ML IJ SOLN: 5.0000 mg | Freq: Three times a day (TID) | INTRAMUSCULAR | Status: DC | PRN

## 2016-06-26 MED ORDER — ONDANSETRON HCL 4 MG PO TABS
4.0000 mg | ORAL_TABLET | Freq: Four times a day (QID) | ORAL | Status: DC | PRN
Start: 2016-06-26 — End: 2016-06-27
  Administered 2016-06-27: 4 mg via ORAL
  Filled 2016-06-26: qty 1

## 2016-06-26 MED ORDER — MORPHINE SULFATE (PF) 4 MG/ML IV SOLN
INTRAVENOUS | Status: DC | PRN
  Administered 2016-06-26: 8 mg via INTRAVENOUS

## 2016-06-26 MED ORDER — HYDROMORPHONE HCL 1 MG/ML IJ SOLN
INTRAMUSCULAR | Status: AC
  Administered 2016-06-26: 0.5 mg via INTRAVENOUS
  Filled 2016-06-26: qty 1

## 2016-06-26 MED ORDER — CLONIDINE HCL (ANALGESIA) 100 MCG/ML EP SOLN
EPIDURAL | Status: DC | PRN
  Administered 2016-06-26: 1 mL

## 2016-06-26 MED ORDER — CYCLOBENZAPRINE HCL 10 MG PO TABS
10.0000 mg | ORAL_TABLET | Freq: Every day | ORAL | Status: DC
  Administered 2016-06-26: 10 mg via ORAL
  Filled 2016-06-26: qty 1

## 2016-06-26 MED ORDER — HYDROMORPHONE HCL 1 MG/ML IJ SOLN
0.2500 mg | INTRAMUSCULAR | Status: DC | PRN
Start: 2016-06-26 — End: 2016-06-26
  Administered 2016-06-26 (×2): 0.5 mg via INTRAVENOUS

## 2016-06-26 MED ORDER — PROPOFOL 10 MG/ML IV BOLUS
INTRAVENOUS | Status: AC
  Filled 2016-06-26: qty 20

## 2016-06-26 MED ORDER — FENTANYL CITRATE (PF) 100 MCG/2ML IJ SOLN
INTRAMUSCULAR | Status: AC
  Filled 2016-06-26: qty 2

## 2016-06-26 MED ORDER — LIDOCAINE HCL (CARDIAC) 20 MG/ML IV SOLN
INTRAVENOUS | Status: DC | PRN
  Administered 2016-06-26: 60 mg via INTRAVENOUS

## 2016-06-26 MED ORDER — OXYCODONE HCL 5 MG PO TABS: 5.0000 mg | ORAL_TABLET | Freq: Once | ORAL | Status: DC | PRN

## 2016-06-26 MED ORDER — CLINDAMYCIN PHOSPHATE 600 MG/50ML IV SOLN
600.0000 mg | Freq: Four times a day (QID) | INTRAVENOUS | Status: AC
  Administered 2016-06-26 – 2016-06-27 (×2): 600 mg via INTRAVENOUS
  Filled 2016-06-26 (×2): qty 50

## 2016-06-26 MED ORDER — MIDAZOLAM HCL 2 MG/2ML IJ SOLN
INTRAMUSCULAR | Status: AC
  Filled 2016-06-26: qty 2

## 2016-06-26 MED ORDER — LACTATED RINGERS IV SOLN
INTRAVENOUS | Status: DC
  Administered 2016-06-26 (×2): via INTRAVENOUS

## 2016-06-26 MED ORDER — LIDOCAINE 2% (20 MG/ML) 5 ML SYRINGE
INTRAMUSCULAR | Status: AC
  Filled 2016-06-26: qty 5

## 2016-06-26 MED ORDER — ACETAMINOPHEN 325 MG PO TABS: 650.0000 mg | ORAL_TABLET | Freq: Four times a day (QID) | ORAL | Status: DC | PRN

## 2016-06-26 MED ORDER — ONDANSETRON HCL 4 MG/2ML IJ SOLN: 4.0000 mg | Freq: Four times a day (QID) | INTRAMUSCULAR | Status: DC | PRN

## 2016-06-26 MED ORDER — MORPHINE SULFATE (PF) 4 MG/ML IV SOLN
4.0000 mg | INTRAVENOUS | Status: DC | PRN
  Administered 2016-06-26 – 2016-06-27 (×2): 4 mg via INTRAVENOUS
  Filled 2016-06-26 (×2): qty 1

## 2016-06-26 MED ORDER — FENTANYL CITRATE (PF) 100 MCG/2ML IJ SOLN
INTRAMUSCULAR | Status: DC | PRN
  Administered 2016-06-26 (×7): 25 ug via INTRAVENOUS
  Administered 2016-06-26: 50 ug via INTRAVENOUS
  Administered 2016-06-26: 25 ug via INTRAVENOUS
  Administered 2016-06-26: 50 ug via INTRAVENOUS

## 2016-06-26 MED ORDER — PROPOFOL 10 MG/ML IV BOLUS
INTRAVENOUS | Status: DC | PRN
  Administered 2016-06-26: 200 mg via INTRAVENOUS
  Administered 2016-06-26: 40 mg via INTRAVENOUS

## 2016-06-26 MED ORDER — CLINDAMYCIN PHOSPHATE 900 MG/50ML IV SOLN
900.0000 mg | INTRAVENOUS | Status: AC
  Administered 2016-06-26: 900 mg via INTRAVENOUS
  Filled 2016-06-26: qty 50

## 2016-06-26 MED ORDER — METOCLOPRAMIDE HCL 5 MG PO TABS: 5.0000 mg | ORAL_TABLET | Freq: Three times a day (TID) | ORAL | Status: DC | PRN

## 2016-06-26 MED ORDER — ONDANSETRON HCL 4 MG/2ML IJ SOLN
INTRAMUSCULAR | Status: DC | PRN
  Administered 2016-06-26: 4 mg via INTRAVENOUS

## 2016-06-26 MED ORDER — SODIUM CHLORIDE 0.9 % IR SOLN
Status: DC | PRN
  Administered 2016-06-26: 4000 mL

## 2016-06-26 MED ORDER — OXYCODONE HCL 5 MG/5ML PO SOLN: 5.0000 mg | Freq: Once | ORAL | Status: DC | PRN

## 2016-06-26 MED ORDER — BUPIVACAINE HCL (PF) 0.25 % IJ SOLN
INTRAMUSCULAR | Status: AC
  Filled 2016-06-26: qty 30

## 2016-06-26 MED ORDER — OXYCODONE-ACETAMINOPHEN 5-325 MG PO TABS
1.0000 | ORAL_TABLET | Freq: Four times a day (QID) | ORAL | Status: DC | PRN
  Administered 2016-06-27: 1 via ORAL
  Filled 2016-06-26: qty 1

## 2016-06-26 MED ORDER — BUPIVACAINE-EPINEPHRINE (PF) 0.5% -1:200000 IJ SOLN
INTRAMUSCULAR | Status: DC | PRN
Start: 2016-06-26 — End: 2016-06-26
  Administered 2016-06-26: 25 mL via PERINEURAL

## 2016-06-26 MED ORDER — SUGAMMADEX SODIUM 200 MG/2ML IV SOLN
INTRAVENOUS | Status: AC
  Filled 2016-06-26: qty 2

## 2016-06-26 MED ORDER — BUPIVACAINE-EPINEPHRINE (PF) 0.5% -1:200000 IJ SOLN
INTRAMUSCULAR | Status: AC
  Filled 2016-06-26: qty 30

## 2016-06-26 MED ORDER — FENTANYL CITRATE (PF) 100 MCG/2ML IJ SOLN
INTRAMUSCULAR | Status: AC
  Filled 2016-06-26: qty 4

## 2016-06-26 MED ORDER — PHENYLEPHRINE 40 MCG/ML (10ML) SYRINGE FOR IV PUSH (FOR BLOOD PRESSURE SUPPORT)
PREFILLED_SYRINGE | INTRAVENOUS | Status: AC
  Filled 2016-06-26: qty 20

## 2016-06-26 MED ORDER — MIDAZOLAM HCL 5 MG/5ML IJ SOLN
INTRAMUSCULAR | Status: DC | PRN
  Administered 2016-06-26: 2 mg via INTRAVENOUS

## 2016-06-26 MED ORDER — BUPIVACAINE-EPINEPHRINE (PF) 0.5% -1:200000 IJ SOLN
INTRAMUSCULAR | Status: DC | PRN
  Administered 2016-06-26: 13 mL via PERINEURAL

## 2016-06-26 MED ORDER — ACETAMINOPHEN 650 MG RE SUPP: 650.0000 mg | Freq: Four times a day (QID) | RECTAL | Status: DC | PRN

## 2016-06-26 MED ORDER — ASPIRIN 325 MG PO TABS
325.0000 mg | ORAL_TABLET | Freq: Every day | ORAL | Status: DC
  Administered 2016-06-26 – 2016-06-27 (×2): 325 mg via ORAL
  Filled 2016-06-26 (×2): qty 1

## 2016-06-26 MED ORDER — DICLOFENAC SODIUM 50 MG PO TBEC
50.0000 mg | DELAYED_RELEASE_TABLET | Freq: Two times a day (BID) | ORAL | Status: DC
  Administered 2016-06-26 – 2016-06-27 (×2): 50 mg via ORAL
  Filled 2016-06-26 (×3): qty 1

## 2016-06-26 MED ORDER — MORPHINE SULFATE (PF) 4 MG/ML IV SOLN
INTRAVENOUS | Status: AC
  Filled 2016-06-26: qty 2

## 2016-06-26 MED ORDER — ONDANSETRON HCL 4 MG/2ML IJ SOLN
INTRAMUSCULAR | Status: AC
  Filled 2016-06-26: qty 2

## 2016-06-26 SURGICAL SUPPLY — 90 items
ALCOHOL 70% 16 OZ (MISCELLANEOUS) ×3 IMPLANT
BANDAGE ACE 6X5 VEL STRL LF (GAUZE/BANDAGES/DRESSINGS) ×3 IMPLANT
BANDAGE ELASTIC 6 VELCRO ST LF (GAUZE/BANDAGES/DRESSINGS) ×3 IMPLANT
BANDAGE ESMARK 6X9 LF (GAUZE/BANDAGES/DRESSINGS) ×1 IMPLANT
BIT DRILL 7/64X5 DISP (BIT) ×3 IMPLANT
BLADE CUTTER GATOR 3.5 (BLADE) ×3 IMPLANT
BLADE GREAT WHITE 4.2 (BLADE) ×2 IMPLANT
BLADE GREAT WHITE 4.2MM (BLADE) ×1
BLADE SURG 10 STRL SS (BLADE) ×3 IMPLANT
BLADE SURG 15 STRL LF DISP TIS (BLADE) ×1 IMPLANT
BLADE SURG 15 STRL SS (BLADE) ×2
BLADE SURG ROTATE 9660 (MISCELLANEOUS) IMPLANT
BNDG COHESIVE 4X5 TAN STRL (GAUZE/BANDAGES/DRESSINGS) ×3 IMPLANT
BNDG ESMARK 6X9 LF (GAUZE/BANDAGES/DRESSINGS) ×3
BNDG GAUZE ELAST 4 BULKY (GAUZE/BANDAGES/DRESSINGS) ×3 IMPLANT
BUR OVAL 6.0 (BURR) IMPLANT
CLOSURE STERI-STRIP 1/2X4 (GAUZE/BANDAGES/DRESSINGS) ×1
CLSR STERI-STRIP ANTIMIC 1/2X4 (GAUZE/BANDAGES/DRESSINGS) ×2 IMPLANT
COVER MAYO STAND STRL (DRAPES) ×3 IMPLANT
COVER SURGICAL LIGHT HANDLE (MISCELLANEOUS) ×3 IMPLANT
CUFF TOURNIQUET SINGLE 34IN LL (TOURNIQUET CUFF) IMPLANT
CUFF TOURNIQUET SINGLE 44IN (TOURNIQUET CUFF) IMPLANT
DECANTER SPIKE VIAL GLASS SM (MISCELLANEOUS) ×3 IMPLANT
DEPTH GAUGE CBS 1.6 KW 180MM (MISCELLANEOUS) ×3 IMPLANT
DRAPE ARTHROSCOPY W/POUCH 114 (DRAPES) ×3 IMPLANT
DRAPE INCISE IOBAN 66X45 STRL (DRAPES) IMPLANT
DRAPE PROXIMA HALF (DRAPES) IMPLANT
DRAPE U-SHAPE 47X51 STRL (DRAPES) ×3 IMPLANT
DRSG ADAPTIC 3X8 NADH LF (GAUZE/BANDAGES/DRESSINGS) ×3 IMPLANT
DRSG AQUACEL AG ADV 3.5X10 (GAUZE/BANDAGES/DRESSINGS) ×3 IMPLANT
DRSG PAD ABDOMINAL 8X10 ST (GAUZE/BANDAGES/DRESSINGS) ×3 IMPLANT
DURAPREP 26ML APPLICATOR (WOUND CARE) ×3 IMPLANT
ELECT REM PT RETURN 9FT ADLT (ELECTROSURGICAL) ×3
ELECTRODE REM PT RTRN 9FT ADLT (ELECTROSURGICAL) ×1 IMPLANT
GAUZE SPONGE 4X4 12PLY STRL (GAUZE/BANDAGES/DRESSINGS) ×3 IMPLANT
GAUZE XEROFORM 1X8 LF (GAUZE/BANDAGES/DRESSINGS) IMPLANT
GAUZE XEROFORM 5X9 LF (GAUZE/BANDAGES/DRESSINGS) ×3 IMPLANT
GLOVE BIOGEL PI IND STRL 7.5 (GLOVE) ×1 IMPLANT
GLOVE BIOGEL PI IND STRL 8 (GLOVE) ×1 IMPLANT
GLOVE BIOGEL PI INDICATOR 7.5 (GLOVE) ×2
GLOVE BIOGEL PI INDICATOR 8 (GLOVE) ×2
GLOVE ECLIPSE 7.0 STRL STRAW (GLOVE) ×3 IMPLANT
GLOVE SURG ORTHO 8.0 STRL STRW (GLOVE) ×3 IMPLANT
GOWN EXTRA PROTECTION XXL 0583 (GOWNS) ×3 IMPLANT
GOWN STRL REUS W/ TWL LRG LVL3 (GOWN DISPOSABLE) ×2 IMPLANT
GOWN STRL REUS W/ TWL XL LVL3 (GOWN DISPOSABLE) ×1 IMPLANT
GOWN STRL REUS W/TWL LRG LVL3 (GOWN DISPOSABLE) ×4
GOWN STRL REUS W/TWL XL LVL3 (GOWN DISPOSABLE) ×2
IMMOBILIZER KNEE 22 UNIV (SOFTGOODS) ×3 IMPLANT
KIT BASIN OR (CUSTOM PROCEDURE TRAY) ×3 IMPLANT
KIT ROOM TURNOVER OR (KITS) ×3 IMPLANT
MANIFOLD NEPTUNE II (INSTRUMENTS) ×3 IMPLANT
NEEDLE 18GX1X1/2 (RX/OR ONLY) (NEEDLE) IMPLANT
NEEDLE HYPO 25GX1X1/2 BEV (NEEDLE) ×3 IMPLANT
NEEDLE MAYO TROCAR (NEEDLE) ×3 IMPLANT
NS IRRIG 1000ML POUR BTL (IV SOLUTION) ×3 IMPLANT
PACK ARTHROSCOPY DSU (CUSTOM PROCEDURE TRAY) ×3 IMPLANT
PACK ORTHO EXTREMITY (CUSTOM PROCEDURE TRAY) ×3 IMPLANT
PAD ARMBOARD 7.5X6 YLW CONV (MISCELLANEOUS) ×6 IMPLANT
PADDING CAST COTTON 6X4 STRL (CAST SUPPLIES) ×3 IMPLANT
PASSER SUT SWANSON 36MM LOOP (INSTRUMENTS) ×3 IMPLANT
PENCIL BUTTON HOLSTER BLD 10FT (ELECTRODE) ×3 IMPLANT
SET ARTHROSCOPY TUBING (MISCELLANEOUS) ×2
SET ARTHROSCOPY TUBING LN (MISCELLANEOUS) ×1 IMPLANT
SOL PREP POV-IOD 4OZ 10% (MISCELLANEOUS) ×3 IMPLANT
SPONGE LAP 18X18 X RAY DECT (DISPOSABLE) ×3 IMPLANT
SPONGE LAP 4X18 X RAY DECT (DISPOSABLE) ×3 IMPLANT
STAPLER VISISTAT 35W (STAPLE) ×3 IMPLANT
STOCKINETTE IMPERVIOUS 9X36 MD (GAUZE/BANDAGES/DRESSINGS) ×3 IMPLANT
SUT ETHILON 3 0 PS 1 (SUTURE) IMPLANT
SUT FIBERWIRE #2 38 T-5 BLUE (SUTURE) ×9
SUT MENISCAL KIT (KITS) IMPLANT
SUT MNCRL AB 3-0 PS2 18 (SUTURE) ×3 IMPLANT
SUT VIC AB 0 CT1 27 (SUTURE) ×4
SUT VIC AB 0 CT1 27XBRD ANBCTR (SUTURE) ×2 IMPLANT
SUT VIC AB 2-0 CT1 27 (SUTURE) ×2
SUT VIC AB 2-0 CT1 TAPERPNT 27 (SUTURE) ×1 IMPLANT
SUTURE FIBERWR #2 38 T-5 BLUE (SUTURE) ×3 IMPLANT
SYR 20ML ECCENTRIC (SYRINGE) ×3 IMPLANT
SYR CONTROL 10ML LL (SYRINGE) IMPLANT
SYR TB 1ML LUER SLIP (SYRINGE) ×3 IMPLANT
SYRINGE 3CC LL L/F (MISCELLANEOUS) ×3 IMPLANT
TOWEL OR 17X24 6PK STRL BLUE (TOWEL DISPOSABLE) ×3 IMPLANT
TOWEL OR 17X26 10 PK STRL BLUE (TOWEL DISPOSABLE) ×3 IMPLANT
TUBE CONNECTING 12'X1/4 (SUCTIONS) ×1
TUBE CONNECTING 12X1/4 (SUCTIONS) ×2 IMPLANT
UNDERPAD 30X30 (UNDERPADS AND DIAPERS) ×3 IMPLANT
WAND HAND CNTRL MULTIVAC 90 (MISCELLANEOUS) IMPLANT
WATER STERILE IRR 1000ML POUR (IV SOLUTION) ×3 IMPLANT
YANKAUER SUCT BULB TIP NO VENT (SUCTIONS) ×3 IMPLANT

## 2016-06-26 NOTE — Transfer of Care (Signed)
Immediate Anesthesia Transfer of Care Note  Patient: Roy Martinez  Procedure(s) Performed: Procedure(s): PATELLA TENDON REPAIR (Right) KNEE ARTHROSCOPY WITH MENISCAL REPAIR VS RESECTION (Right)  Patient Location: PACU  Anesthesia Type:General  Level of Consciousness: awake, alert  and oriented  Airway & Oxygen Therapy: Patient Spontanous Breathing and Patient connected to nasal cannula oxygen  Post-op Assessment: Report given to RN, Post -op Vital signs reviewed and stable and Patient moving all extremities  Post vital signs: Reviewed and stable  Last Vitals:  Vitals:   06/26/16 1014  BP: (!) 150/72  Pulse: (!) 53  Resp: 20  Temp: 36.5 C    Last Pain:  Vitals:   06/26/16 1103  TempSrc:   PainSc: 5          Complications: No apparent anesthesia complications

## 2016-06-26 NOTE — Brief Op Note (Signed)
06/26/2016  3:50 PM  PATIENT:  Roy Martinez  60 y.o. male  PRE-OPERATIVE DIAGNOSIS:  RIGHT KNEE PATELLA TENDON RUPTURE, MEDIAL MENISCAL TEAR  POST-OPERATIVE DIAGNOSIS:  RIGHT KNEE PATELLA TENDON RUPTURE, MEDIAL MENISCAL TEAR lateral meniscal tear  PROCEDURE:  Procedure(s): PATELLA TENDON REPAIR KNEE ARTHROSCOPY WITH MENISCAL  RESECTION medial and lateral  SURGEON:  Surgeon(s): Meredith Pel, MD  ASSISTANT: Laure Kidney rnfa  ANESTHESIA:   general  EBL: 75 ml    Total I/O In: 1000 [I.V.:1000] Out: 50 [Blood:50]  BLOOD ADMINISTERED: none  DRAINS: none   LOCAL MEDICATIONS USED:  Marcaine mso4 clonidine  SPECIMEN:  No Specimen  COUNTS:  YES  TOURNIQUET:   Total Tourniquet Time Documented: Thigh (Right) - 52 minutes Total: Thigh (Right) - 52 minutes   DICTATION: .Other Dictation: Dictation Number [id number]  PLAN OF CARE: Admit for overnight observation  PATIENT DISPOSITION:  PACU - hemodynamically stable

## 2016-06-26 NOTE — Anesthesia Procedure Notes (Signed)
Procedure Name: LMA Insertion Date/Time: 06/26/2016 12:56 PM Performed by: Candis Shine Pre-anesthesia Checklist: Patient identified, Emergency Drugs available, Suction available and Patient being monitored Patient Re-evaluated:Patient Re-evaluated prior to inductionOxygen Delivery Method: Circle System Utilized Preoxygenation: Pre-oxygenation with 100% oxygen Intubation Type: IV induction LMA: LMA inserted LMA Size: 5.0 Number of attempts: 1 Placement Confirmation: positive ETCO2 Tube secured with: Tape Dental Injury: Teeth and Oropharynx as per pre-operative assessment

## 2016-06-26 NOTE — Anesthesia Procedure Notes (Signed)
Anesthesia Regional Block:  Femoral nerve block  Pre-Anesthetic Checklist: ,, timeout performed, Correct Patient, Correct Site, Correct Laterality, Correct Procedure, Correct Position, site marked, Risks and benefits discussed,  Surgical consent,  Pre-op evaluation,  At surgeon's request and post-op pain management  Laterality: Lower and Right  Prep: chloraprep       Needles:  Injection technique: Single-shot  Needle Type: Echogenic Stimulator Needle          Additional Needles:  Procedures: ultrasound guided (picture in chart) Femoral nerve block Narrative:  Injection made incrementally with aspirations every 5 mL.  Performed by: Personally  Anesthesiologist: Akiem Urieta  Additional Notes: H+P and labs reviewed, risks and benefits discussed with patient, procedure tolerated well without complications

## 2016-06-26 NOTE — Anesthesia Preprocedure Evaluation (Signed)
Anesthesia Evaluation  Patient identified by MRN, date of birth, ID band Patient awake    Reviewed: Allergy & Precautions, NPO status , Patient's Chart, lab work & pertinent test results  History of Anesthesia Complications Negative for: history of anesthetic complications  Airway Mallampati: II  TM Distance: >3 FB Neck ROM: Full    Dental  (+) Teeth Intact   Pulmonary Current Smoker,    breath sounds clear to auscultation       Cardiovascular negative cardio ROS   Rhythm:Regular     Neuro/Psych negative neurological ROS  negative psych ROS   GI/Hepatic negative GI ROS, Neg liver ROS,   Endo/Other  negative endocrine ROS  Renal/GU negative Renal ROS     Musculoskeletal   Abdominal   Peds  Hematology negative hematology ROS (+)   Anesthesia Other Findings   Reproductive/Obstetrics                             Anesthesia Physical Anesthesia Plan  ASA: II  Anesthesia Plan: General and Regional   Post-op Pain Management:    Induction: Intravenous  Airway Management Planned: LMA and Oral ETT  Additional Equipment: None  Intra-op Plan:   Post-operative Plan: Extubation in OR  Informed Consent: I have reviewed the patients History and Physical, chart, labs and discussed the procedure including the risks, benefits and alternatives for the proposed anesthesia with the patient or authorized representative who has indicated his/her understanding and acceptance.   Dental advisory given  Plan Discussed with: CRNA and Surgeon  Anesthesia Plan Comments:         Anesthesia Quick Evaluation

## 2016-06-26 NOTE — Interval H&P Note (Signed)
History and Physical Interval Note:  06/26/2016 12:24 PM  Roy Martinez  has presented today for surgery, with the diagnosis of RIGHT KNEE PATELLA TENDON RUPTURE, MEDIAL MENISCAL TEAR  The various methods of treatment have been discussed with the patient and family. After consideration of risks, benefits and other options for treatment, the patient has consented to  Procedure(s): PATELLA TENDON REPAIR (Right) KNEE ARTHROSCOPY WITH MENISCAL REPAIR VS RESECTION (Right) as a surgical intervention .  The patient's history has been reviewed, patient examined, no change in status, stable for surgery.  I have reviewed the patient's chart and labs.  Questions were answered to the patient's satisfaction.     [name]

## 2016-06-27 ENCOUNTER — Encounter (HOSPITAL_COMMUNITY): Payer: Self-pay | Admitting: Orthopedic Surgery

## 2016-06-27 DIAGNOSIS — M23261 Derangement of other lateral meniscus due to old tear or injury, right knee: Secondary | ICD-10-CM | POA: Diagnosis not present

## 2016-06-27 DIAGNOSIS — S76191A Other specified injury of right quadriceps muscle, fascia and tendon, initial encounter: Secondary | ICD-10-CM | POA: Diagnosis not present

## 2016-06-27 DIAGNOSIS — M2241 Chondromalacia patellae, right knee: Secondary | ICD-10-CM | POA: Diagnosis not present

## 2016-06-27 DIAGNOSIS — M23221 Derangement of posterior horn of medial meniscus due to old tear or injury, right knee: Secondary | ICD-10-CM | POA: Diagnosis not present

## 2016-06-27 DIAGNOSIS — M25461 Effusion, right knee: Secondary | ICD-10-CM | POA: Diagnosis not present

## 2016-06-27 DIAGNOSIS — M65861 Other synovitis and tenosynovitis, right lower leg: Secondary | ICD-10-CM | POA: Diagnosis not present

## 2016-06-27 MED ORDER — OXYCODONE-ACETAMINOPHEN 5-325 MG PO TABS: 1.0000 | ORAL_TABLET | Freq: Four times a day (QID) | ORAL | 0 refills | Status: DC | PRN

## 2016-06-27 MED ORDER — ASPIRIN 325 MG PO TABS: 325.0000 mg | ORAL_TABLET | Freq: Every day | ORAL | 0 refills | Status: DC

## 2016-06-27 NOTE — Progress Notes (Signed)
Physical Therapy Treatment Patient Details Name: Roy Martinez MRN: [id number] DOB: November 09, 1955 Today's Date: 06/27/2016    History of Present Illness 60 year old patient who is now s/p Rt patellar tendon repair with meniscal repair on 06/26/16.     PT Comments    Pt progressing with his mobility to the point where the pt states that he feels confident with going home today. Pt able to ambulate 200 ft with supervision and go up/down 10 stairs. Reinforcing safety considerations throughout session including use of brace at all time. The pt verbalizes understanding and denies any questions or concerns. PT to continue to follow acutely with HHPT to check and progress mobility as tolerated. Pt in agreement.   Follow Up Recommendations  Home health PT;Supervision - Intermittent     Equipment Recommendations  Rolling walker with 5" wheels    Recommendations for Other Services       Precautions / Restrictions Precautions Precaution Comments: reviewed need for brace at all times and no bending of knee. Pt verbalized understanding Required Braces or Orthoses: Knee Immobilizer - Right Knee Immobilizer - Right: On at all times Restrictions Weight Bearing Restrictions: Yes RLE Weight Bearing: Weight bearing as tolerated    Mobility  Bed Mobility Overal bed mobility: Independent Bed Mobility: Supine to Sit     Supine to sit: Independent     General bed mobility comments: move slowly but no assistance needed.   Transfers Overall transfer level: Modified independent Equipment used: Rolling walker (2 wheeled) Transfers: Sit to/from Stand Sit to Stand: Modified independent (Device/Increase time)         General transfer comment: repeated X6 throughout session  Ambulation/Gait Ambulation/Gait assistance: Supervision Ambulation Distance (Feet): 200 Feet Assistive device: Rolling walker (2 wheeled) Gait Pattern/deviations: Step-through pattern;Decreased weight shift to right Gait  velocity: decreased   General Gait Details: no loss of balance, pt reports feeling confident with getting around.    Stairs Stairs: Yes Stairs assistance: Min guard Stair Management: Step to pattern;One rail Right;Forwards;With crutches Number of Stairs: 10 General stair comments: up 5 stairs with bilateral rails per pt request then modified to Rt rail and crutch on Lt. Pt with good stability, no loss of balance. Reinforcing techniques for safety. Pt reports feeling confident with doing the stairs at home.   Wheelchair Mobility    Modified Rankin (Stroke Patients Only)       Balance Overall balance assessment: Needs assistance Sitting-balance support: No upper extremity supported Sitting balance-Leahy Scale: Good     Standing balance support: During functional activity Standing balance-Leahy Scale: Fair Standing balance comment: able to stand without device (static)                    Cognition Arousal/Alertness: Awake/alert Behavior During Therapy: WFL for tasks assessed/performed Overall Cognitive Status: Within Functional Limits for tasks assessed                      Exercises      General Comments        Pertinent Vitals/Pain Pain Assessment: Faces Faces Pain Scale: Hurts a little bit Pain Location: Rt knee Pain Descriptors / Indicators: Guarding Pain Intervention(s): Limited activity within patient's tolerance;Monitored during session    Home Living                      Prior Function            PT Goals (current goals can now be found in  the care plan section) Acute Rehab PT Goals Patient Stated Goal: go home PT Goal Formulation: With patient Time For Goal Achievement: 07/04/16 Potential to Achieve Goals: Good Progress towards PT goals: Progressing toward goals    Frequency    Min 5X/week      PT Plan Current plan remains appropriate    Co-evaluation             End of Session Equipment Utilized During  Treatment: Gait belt;Right knee immobilizer Activity Tolerance: Patient tolerated treatment well Patient left: in chair;with call bell/phone within reach     Time: [id number] PT Time Calculation (min) (ACUTE ONLY): 25 min  Charges:  $Gait Training: 23-37 mins                    G Codes:      Cassell Clement, PT, CSCS Pager [phone] Office [phone]  06/27/2016, 3:17 PM

## 2016-06-27 NOTE — Evaluation (Signed)
Physical Therapy Evaluation Patient Details Name: Roy Martinez MRN: [id number] DOB: August 26, 1956 Today's Date: 06/27/2016   History of Present Illness  60 year old patient who is now s/p Rt patellar tendon repair with meniscal repair on 06/26/16.   Clinical Impression  Pt is slow with mobilization during initial PT session but able to ambulate 100 ft with rw and supervision assistance. He will need to be able to ambulate up/down stairs prior to D/C, will check back to attempt. Pt reports living alone but states that he does have some family/friends who might be able to check in on him.      Follow Up Recommendations Home health PT;Supervision - Intermittent    Equipment Recommendations  Rolling walker with 5" wheels    Recommendations for Other Services       Precautions / Restrictions Precautions Precautions: Fall Precaution Comments: no knee flexion Required Braces or Orthoses: Knee Immobilizer - Right Knee Immobilizer - Right: On at all times Restrictions Weight Bearing Restrictions: Yes RLE Weight Bearing: Weight bearing as tolerated      Mobility  Bed Mobility Overal bed mobility: Needs Assistance Bed Mobility: Supine to Sit     Supine to sit: Supervision     General bed mobility comments: Pt moving slowly but able to complete with time and encouragement.   Transfers Overall transfer level: Needs assistance Equipment used: Rolling walker (2 wheeled) Transfers: Sit to/from Stand Sit to Stand: Min guard         General transfer comment: Pt with good technique and stability  Ambulation/Gait Ambulation/Gait assistance: Supervision Ambulation Distance (Feet): 100 Feet Assistive device: Rolling walker (2 wheeled) Gait Pattern/deviations: Step-through pattern;Decreased weight shift to right Gait velocity: decreased   General Gait Details: mild decreased weight through Rt LE, cues for posture provided as needed.   Stairs            Wheelchair Mobility     Modified Rankin (Stroke Patients Only)       Balance Overall balance assessment: Needs assistance Sitting-balance support: No upper extremity supported Sitting balance-Leahy Scale: Good     Standing balance support: Single extremity supported Standing balance-Leahy Scale: Fair Standing balance comment: using rw during ambulation                             Pertinent Vitals/Pain Pain Assessment: Faces Faces Pain Scale: Hurts a little bit Pain Location: Rt knee Pain Descriptors / Indicators: Guarding Pain Intervention(s): Limited activity within patient's tolerance;Monitored during session    [place] expects to be discharged to:: Private residence Living Arrangements: Alone Available Help at Discharge:  (pt reports uncertain if able to get help) Type of Home: Apartment Home Access: Stairs to enter Entrance Stairs-Rails: Right Entrance Stairs-Number of Steps: 10 Home Layout: One level Home Equipment: Crutches Additional Comments: Pt states that he does have family and friends but he does not think that any of them can help him.     Prior Function Level of Independence: Independent with assistive device(s)         Comments: occasional use of crutches     Hand Dominance        Extremity/Trunk Assessment   Upper Extremity Assessment: Overall WFL for tasks assessed           Lower Extremity Assessment: RLE deficits/detail RLE Deficits / Details: pt able to move Rt LE laterally in bed without assistance       Communication   Communication:  No difficulties  Cognition Arousal/Alertness: Awake/alert Behavior During Therapy: WFL for tasks assessed/performed Overall Cognitive Status: Within Functional Limits for tasks assessed                      General Comments General comments (skin integrity, edema, etc.): Pt educated on how to adjust brace.     Exercises     Assessment/Plan    PT Assessment Patient needs  continued PT services  PT Problem List Decreased strength;Decreased range of motion;Decreased activity tolerance;Decreased balance;Decreased mobility          PT Treatment Interventions DME instruction;Gait training;Stair training;Functional mobility training;Therapeutic activities;Therapeutic exercise;Patient/family education    PT Goals (Current goals can be found in the Care Plan section)  Acute Rehab PT Goals Patient Stated Goal: get some help for home PT Goal Formulation: With patient Time For Goal Achievement: 07/04/16 Potential to Achieve Goals: Good    Frequency Min 5X/week   Barriers to discharge        Co-evaluation               End of Session Equipment Utilized During Treatment: Gait belt;Right knee immobilizer Activity Tolerance: Patient tolerated treatment well Patient left: in chair;with call bell/phone within reach Nurse Communication: Mobility status;Weight bearing status    Functional Assessment Tool Used: clinical judgment Functional Limitation: Mobility: Walking and moving around Mobility: Walking and Moving Around Current Status [id number]): At least 20 percent but less than 40 percent impaired, limited or restricted Mobility: Walking and Moving Around Goal Status [phone]): At least 1 percent but less than 20 percent impaired, limited or restricted    Time: 0813-0854 PT Time Calculation (min) (ACUTE ONLY): 41 min   Charges:   PT Evaluation $PT Eval Moderate Complexity: 1 Procedure PT Treatments $Gait Training: 8-22 mins $Therapeutic Activity: 8-22 mins   PT G Codes:   PT G-Codes **NOT FOR INPATIENT CLASS** Functional Assessment Tool Used: clinical judgment Functional Limitation: Mobility: Walking and moving around Mobility: Walking and Moving Around Current Status [id number]): At least 20 percent but less than 40 percent impaired, limited or restricted Mobility: Walking and Moving Around Goal Status [id number]): At least 1 percent but less than 20  percent impaired, limited or restricted    Cassell Clement, PT, CSCS Pager [phone] Office [phone]  06/27/2016, 9:10 AM

## 2016-06-27 NOTE — Care Management Note (Signed)
Case Management Note  Patient Details  Name: Tavarous Maese MRN: [id number] Date of Birth: 07/02/1956  Subjective/Objective:   60 yr old gentleman s/p right tendon and meniscus repair.                  Action/Plan: Case manager spoke with patient concerning home health needs. Therapy recommends HHPT and DME for patient. CM offered choice for Home Health agency. Referral called to Tonny [facility], [facility] Liaison.    Expected Discharge Date:   06/27/16               Expected Discharge Plan:  [place]  In-House Referral:     Discharge planning Services  CM Consult  Post Acute Care Choice:  Durable Medical Equipment, Home Health Choice offered to:  Patient  DME Arranged:  Walker rolling DME Agency:  [facility] Arranged:  PT [place]:  [place]  Status of Service:  Completed, signed off  If discussed at [place] of Stay Meetings, dates discussed:    Additional Comments:  Ninfa Meeker, RN 06/27/2016, 3:15 PM

## 2016-06-27 NOTE — Progress Notes (Signed)
Pt ready for discharge to home. All discharge instructions reviewed with pt. Discharge rx given to pt. All personal belongings with pt. Pt exhibits no sign or symptom of distress. Walker delivered to pt.

## 2016-06-27 NOTE — Progress Notes (Signed)
PT Cancellation Note  Patient Details Name: Roy Martinez MRN: [id number] DOB: 06-25-1956   Cancelled Treatment:    Reason Eval/Treat Not Completed: Patient refusing to participate with PT to attempt stairs or ambulation. Pt stating that he just doesn't want to do it right now.  When questioned how he is going to get into his home, he states "I will figure something out". PT to follow and attempt to check back if able.    Cassell Clement, PT, CSCS Pager [phone] Office [phone]  06/27/2016, 11:55 AM

## 2016-06-27 NOTE — Progress Notes (Signed)
Pt transported to front lobby for discharge after stating that his ride was waiting in front of the hospital. Upon arrival to front lobby no ride available for pt. Pt attempted to call ride but no answer. I explained to pt that we would need to proceed back to 5th floor until ride physically arrived. Explained that he could not be left alone in lobby. Pt gets out of wheelchair and states he is not a baby and he will not go back upstairs. Pt has personal belongings with him and is using his walker to proceed outside of hospital on sidewalk. Charge nurse contacted and advised of situation.  Pt was mumbling and grumbling conversation that I could not understand as he was exiting the lobby. Charge nurse advised that pt could not be forcibly made to reenter bldg/unit.

## 2016-06-27 NOTE — Progress Notes (Signed)
Pt stable Dressing ok Plan dc today after PT

## 2016-06-27 NOTE — Anesthesia Postprocedure Evaluation (Signed)
Anesthesia Post Note  Patient: Roy Martinez  Procedure(s) Performed: Procedure(s) (LRB): PATELLA TENDON REPAIR (Right) KNEE ARTHROSCOPY WITH MENISCAL REPAIR VS RESECTION (Right)  Patient location during evaluation: PACU Anesthesia Type: General Level of consciousness: awake and alert Pain management: pain level controlled Vital Signs Assessment: post-procedure vital signs reviewed and stable Respiratory status: spontaneous breathing, nonlabored ventilation, respiratory function stable and patient connected to nasal cannula oxygen Cardiovascular status: blood pressure returned to baseline and stable Postop Assessment: no signs of nausea or vomiting Anesthetic complications: no    Last Vitals:  Vitals:   06/27/16 0138 06/27/16 0520  BP: 107/81 108/88  Pulse: 61 76  Resp: 18 19  Temp: 36.9 C 36.7 C    Last Pain:  Vitals:   06/27/16 1100  TempSrc:   PainSc: 2                  [name]

## 2016-06-27 NOTE — Op Note (Signed)
NAMEANTRELL, Roy Martinez NO.:  [id number]  MEDICAL RECORD NO.:  [id number]  LOCATION:  [id number]                        FACILITY:  [facility]  PHYSICIAN:  Anderson Malta, M.D.    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  SURGEON:  Anderson Malta, M.D.  ASSISTANT:  Dellie Catholic, RNFA.  INDICATIONS:  Roy Martinez is a patient with a several month history of right knee pain, presents for operative management of meniscal pathology along with the patellar tendon rupture.  DESCRIPTION OF PROCEDURE:  The patient was brought to the operating room where general anesthetic was induced.  Preoperative antibiotics were administered and a time-out was called.  Right leg prescrubbed with alcohol and Betadine and allowed the air to dry, prepped with DuraPrep solution and draped in sterile manner.  Roy Martinez was used to cover the operative field.  Incision was made over the proximal aspect of the patella extending down to the tibial tubercle.  Skin and subcutaneous tissue were sharply divided.  The patient had a chronic tear of the patella tendon off the inferior pole of the patella.  There was a tissue which was present which was scar tissue between the ends of the tendon and the ends of the patella.  The patellar tendon was dissected free from the retropatellar fat and the intervening fibrous tissue was removed.  The inferior pole of the patella was prepared using both of knife and curette.  Three #2 FiberWire were placed in a grasping fashion through the patellar tendon.  They were then taken  through drill holes in the patella and secured by taking the 2 strands and tying them to one of the strands from the middle and that was done on both sides.  This gave an excellent repair.  Soft tissue and retinaculum were repaired. Soft tissue was then repaired over the patellar tendon rupture repair. A pretty reasonable repair was achieved.  At this time, leg was elevated, exsanguinated with the Esmarch wrap, tourniquet was inflated. The anterior, inferolateral and anterior inferomedial portals were established under direct visualization through the original incision. Diagnostic arthroscopy demonstrated a non-repairable medial meniscal tear along with a lateral meniscal tear.  Both tears were debrided.  The patient had intact ACL and PCL.  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  Mild grade 1 changes in the trochlea.  Fairly minimal arthritis in the lateral compartment.  The meniscal tear has involved about 50% of the posterior horn of the medial meniscus and about 30% of the lateral meniscus.  Following resection, the knee joint thoroughly irrigated. Instruments were removed.  Portals were closed using #1 Vicryl suture. Tourniquet released.  Thorough irrigation again performed.  Skin closed using interrupted inverted 0 Vicryl suture, 2-0 Vicryl suture and 3-0 Monocryl. Solution of  Marcaine, morphine, clonidine injected to the knee.  An Aquacel dressing placed and a knee immobilizer placed.  The patient will be weightbearing as tolerated with the knee in knee immobilizer.  No bending of the knee for about 3 weeks.     Anderson Malta, M.D.     GSD/MEDQ  D:  06/26/2016  T:  06/27/2016  Job:  [id number]

## 2016-06-28 DIAGNOSIS — Z4801 Encounter for change or removal of surgical wound dressing: Secondary | ICD-10-CM | POA: Diagnosis not present

## 2016-06-28 DIAGNOSIS — M6281 Muscle weakness (generalized): Secondary | ICD-10-CM | POA: Diagnosis not present

## 2016-06-28 DIAGNOSIS — S83241D Other tear of medial meniscus, current injury, right knee, subsequent encounter: Secondary | ICD-10-CM | POA: Diagnosis not present

## 2016-06-28 DIAGNOSIS — F17210 Nicotine dependence, cigarettes, uncomplicated: Secondary | ICD-10-CM | POA: Diagnosis not present

## 2016-06-28 DIAGNOSIS — Z4789 Encounter for other orthopedic aftercare: Secondary | ICD-10-CM | POA: Diagnosis not present

## 2016-07-02 NOTE — Discharge Summary (Signed)
Physician Discharge Summary  Patient ID: Roy Martinez MRN: [id number] DOB/AGE: [birth year] 60 y.o.  Admit date: 06/26/2016 Discharge date: 06/27/2016  Admission Diagnoses:  Active Problems:   Patellar tendon rupture, right, initial encounter   Discharge Diagnoses:  Same  Surgeries: Procedure(s): PATELLA TENDON REPAIR KNEE ARTHROSCOPY WITH MENISCAL  RESECTION on 06/26/2016   Consultants:   Discharged Condition: Stable  Hospital Course: Roy Martinez is an 60 y.o. male who was admitted 06/26/2016 with a chief complaint of knee pain, and found to have a diagnosis of tendon rupture.  They were brought to the operating room on 06/26/2016 and underwent the above named procedures. Did well dced in good condition with knee in knee immobilizer    Antibiotics given:  Anti-infectives    Start     Dose/Rate Route Frequency Ordered Stop   06/26/16 1915  clindamycin (CLEOCIN) IVPB 600 mg     600 mg 100 mL/hr over 30 Minutes Intravenous Every 6 hours 06/26/16 1912 06/27/16 0508   06/26/16 1030  clindamycin (CLEOCIN) IVPB 900 mg     900 mg 100 mL/hr over 30 Minutes Intravenous To ShortStay Surgical 06/26/16 1012 06/26/16 1305    .  Recent vital signs:  Vitals:   06/27/16 0138 06/27/16 0520  BP: 107/81 108/88  Pulse: 61 76  Resp: 18 19  Temp: 98.4 F (36.9 C) 98 F (36.7 C)    Recent laboratory studies:  Results for orders placed or performed during the hospital encounter of 06/25/16  Urinalysis, Routine w reflex microscopic (not at [facility])  Result Value Ref Range   Color, Urine YELLOW YELLOW   APPearance CLEAR CLEAR   Specific Gravity, Urine 1.025 1.005 - 1.030   pH 6.0 5.0 - 8.0   Glucose, UA NEGATIVE NEGATIVE mg/dL   Hgb urine dipstick NEGATIVE NEGATIVE   Bilirubin Urine MODERATE (A) NEGATIVE   Ketones, ur 15 (A) NEGATIVE mg/dL   Protein, ur NEGATIVE NEGATIVE mg/dL   Nitrite NEGATIVE NEGATIVE   Leukocytes, UA NEGATIVE NEGATIVE  Comprehensive metabolic panel  Result Value Ref  Range   Sodium 136 135 - 145 mmol/L   Potassium 4.0 3.5 - 5.1 mmol/L   Chloride 101 101 - 111 mmol/L   CO2 25 22 - 32 mmol/L   Glucose, Bld 110 (H) 65 - 99 mg/dL   BUN 12 6 - 20 mg/dL   Creatinine, Ser 0.68 0.61 - 1.24 mg/dL   Calcium 9.7 8.9 - 10.3 mg/dL   Total Protein 8.8 (H) 6.5 - 8.1 g/dL   Albumin 3.5 3.5 - 5.0 g/dL   AST 29 15 - 41 U/L   ALT 26 17 - 63 U/L   Alkaline Phosphatase 87 38 - 126 U/L   Total Bilirubin 0.8 0.3 - 1.2 mg/dL   GFR calc non Af Amer >60 >60 mL/min   GFR calc Af Amer >60 >60 mL/min   Anion gap 10 5 - 15  CBC  Result Value Ref Range   WBC 6.5 4.0 - 10.5 K/uL   RBC 4.38 4.22 - 5.81 MIL/uL   Hemoglobin 12.9 (L) 13.0 - 17.0 g/dL   HCT 40.5 39.0 - 52.0 %   MCV 92.5 78.0 - 100.0 fL   MCH 29.5 26.0 - 34.0 pg   MCHC 31.9 30.0 - 36.0 g/dL   RDW 12.6 11.5 - 15.5 %   Platelets 604 (H) 150 - 400 K/uL    Discharge Medications:     Medication List    STOP taking these medications  HYDROcodone-acetaminophen 5-325 MG tablet Commonly known as:  NORCO/VICODIN   naproxen 500 MG tablet Commonly known as:  NAPROSYN   traMADol 50 MG tablet Commonly known as:  ULTRAM     TAKE these medications   aspirin 325 MG tablet Take 1 tablet (325 mg total) by mouth daily.   cyclobenzaprine 10 MG tablet Commonly known as:  FLEXERIL Take 1 tablet (10 mg total) by mouth at bedtime.   diclofenac 50 MG EC tablet Commonly known as:  VOLTAREN Take 1 tablet (50 mg total) by mouth 2 (two) times daily.   guaiFENesin 600 MG 12 hr tablet Commonly known as:  MUCINEX Take 1 tablet (600 mg total) by mouth 2 (two) times daily as needed.   oxyCODONE-acetaminophen 5-325 MG tablet Commonly known as:  ROXICET Take 1 tablet by mouth every 6 (six) hours as needed for severe pain.       Diagnostic Studies: Mr Knee Right Wo Contrast  Result Date: 06/09/2016 CLINICAL DATA:  Right knee pain and swelling after a fall to the ground. EXAM: MRI OF THE RIGHT KNEE WITHOUT CONTRAST  TECHNIQUE: Multiplanar, multisequence MR imaging of the knee was performed. No intravenous contrast was administered. COMPARISON:  05/28/2016 FINDINGS: MENISCI Medial meniscus: Peripheral longitudinal tear of the posterior horn as shown on image 7/9. Small free edge tear of the posterior horn. Lateral meniscus:  No tear.  Incomplete discoid variant. LIGAMENTS Cruciates:  Unremarkable Collaterals: Mild edema tracks adjacent to the MCL. This can be incidental but in the appropriate clinical circumstance could represent grade 1 sprain. CARTILAGE Patellofemoral: Moderate to prominent chondral thinning inferiorly along the patella, especially along the medial facet and posterior patellar ridge. Mild marginal spurring. Medial: Moderate to prominent degenerative chondral thinning with confluent degenerative subcortical cystic lesions and edema posteriorly in the medial tibial plateau. Mild marginal spurring. Lateral:  Minimal degenerative chondral thinning. Joint: Very large joint effusion with synovitis. The joint effusion communicates with the subcutaneous space due to the patellar tendon defect. Popliteal Fossa:  Unremarkable Extensor Mechanism: Ruptured incompletely discontinuous patellar tendon at the patellar attachment site. Torn/ruptured medial and lateral patellar retinaculum in the infrapatellar region. Infrapatellar capsular discontinuity line fluid to extend from the joint into the prepatellar bursa. Laxity of the patellar tendon. Distal quadriceps expansion, tendinopathy, and likely partial tearing. The iliotibial band is somewhat truncated anteriorly but otherwise intact. Bones: No significant extra-articular osseous abnormalities identified. Other: No supplemental non-categorized findings. IMPRESSION: 1. The patellar tendon is completely ruptured/discontinuous proximally, and there is also come complete tearing of the adjacent portions the medial and lateral patellar retinaculum allowing continuity of the  knee effusion with the prepatellar bursa and subcutaneous tissues. 2. Peripheral longitudinal vertical tear of the posterior horn medial meniscus involving the superior and inferior meniscal surfaces. Small free edge tear of the posterior horn as well. 3. Grade 1 MCL sprain. 4. Variable chondral thinning, but moderate to prominent inferiorly along the patella and in the medial compartment. Degenerative subcortical cystic lesions and edema posteriorly in the medial tibial plateau. Electronically Signed   By: Van Clines M.D.   On: 06/09/2016 18:09    Disposition: 01-Home or Self Care  Discharge Instructions    Call MD / Call 911    Complete by:  As directed    If you experience chest pain or shortness of breath, CALL 911 and be transported to the hospital emergency room.  If you develope a fever above 101 F, pus (white drainage) or increased drainage or redness at  the wound, or calf pain, call your surgeon's office.   Constipation Prevention    Complete by:  As directed    Drink plenty of fluids.  Prune juice may be helpful.  You may use a stool softener, such as Colace (over the counter) 100 mg twice a day.  Use MiraLax (over the counter) for constipation as needed.   Diet - low sodium heart healthy    Complete by:  As directed    Discharge instructions    Complete by:  As directed    Ok to weight bear in knee immobilizer DO NOT [place] to shower - dressing on knee is waterproof   Increase activity slowly as tolerated    Complete by:  As directed       Follow-up [facility] .   Specialty:  Home Health Services Why:  Someone from [facility] will contact you to arrange start date and time for therapy Contact information: [place] [address] [phone]            Signed: Meredith Pel 07/02/2016, 11:18 AM

## 2016-07-04 ENCOUNTER — Ambulatory Visit (INDEPENDENT_AMBULATORY_CARE_PROVIDER_SITE_OTHER): Payer: Medicare Other | Admitting: Orthopedic Surgery

## 2016-07-04 DIAGNOSIS — F17210 Nicotine dependence, cigarettes, uncomplicated: Secondary | ICD-10-CM | POA: Diagnosis not present

## 2016-07-04 DIAGNOSIS — Z4789 Encounter for other orthopedic aftercare: Secondary | ICD-10-CM | POA: Diagnosis not present

## 2016-07-04 DIAGNOSIS — S86811D Strain of other muscle(s) and tendon(s) at lower leg level, right leg, subsequent encounter: Secondary | ICD-10-CM

## 2016-07-04 DIAGNOSIS — M6281 Muscle weakness (generalized): Secondary | ICD-10-CM | POA: Diagnosis not present

## 2016-07-04 DIAGNOSIS — S83241D Other tear of medial meniscus, current injury, right knee, subsequent encounter: Secondary | ICD-10-CM | POA: Diagnosis not present

## 2016-07-04 DIAGNOSIS — Z4801 Encounter for change or removal of surgical wound dressing: Secondary | ICD-10-CM | POA: Diagnosis not present

## 2016-07-06 DIAGNOSIS — M6281 Muscle weakness (generalized): Secondary | ICD-10-CM | POA: Diagnosis not present

## 2016-07-06 DIAGNOSIS — F17210 Nicotine dependence, cigarettes, uncomplicated: Secondary | ICD-10-CM | POA: Diagnosis not present

## 2016-07-06 DIAGNOSIS — S83241D Other tear of medial meniscus, current injury, right knee, subsequent encounter: Secondary | ICD-10-CM | POA: Diagnosis not present

## 2016-07-06 DIAGNOSIS — Z4789 Encounter for other orthopedic aftercare: Secondary | ICD-10-CM | POA: Diagnosis not present

## 2016-07-06 DIAGNOSIS — Z4801 Encounter for change or removal of surgical wound dressing: Secondary | ICD-10-CM | POA: Diagnosis not present

## 2016-07-10 DIAGNOSIS — Z4801 Encounter for change or removal of surgical wound dressing: Secondary | ICD-10-CM | POA: Diagnosis not present

## 2016-07-10 DIAGNOSIS — Z4789 Encounter for other orthopedic aftercare: Secondary | ICD-10-CM | POA: Diagnosis not present

## 2016-07-10 DIAGNOSIS — M6281 Muscle weakness (generalized): Secondary | ICD-10-CM | POA: Diagnosis not present

## 2016-07-10 DIAGNOSIS — F17210 Nicotine dependence, cigarettes, uncomplicated: Secondary | ICD-10-CM | POA: Diagnosis not present

## 2016-07-10 DIAGNOSIS — S83241D Other tear of medial meniscus, current injury, right knee, subsequent encounter: Secondary | ICD-10-CM | POA: Diagnosis not present

## 2016-07-12 DIAGNOSIS — Z4789 Encounter for other orthopedic aftercare: Secondary | ICD-10-CM | POA: Diagnosis not present

## 2016-07-12 DIAGNOSIS — Z4801 Encounter for change or removal of surgical wound dressing: Secondary | ICD-10-CM | POA: Diagnosis not present

## 2016-07-12 DIAGNOSIS — S83241D Other tear of medial meniscus, current injury, right knee, subsequent encounter: Secondary | ICD-10-CM | POA: Diagnosis not present

## 2016-07-12 DIAGNOSIS — M6281 Muscle weakness (generalized): Secondary | ICD-10-CM | POA: Diagnosis not present

## 2016-07-12 DIAGNOSIS — F17210 Nicotine dependence, cigarettes, uncomplicated: Secondary | ICD-10-CM | POA: Diagnosis not present

## 2016-07-18 DIAGNOSIS — F17210 Nicotine dependence, cigarettes, uncomplicated: Secondary | ICD-10-CM | POA: Diagnosis not present

## 2016-07-18 DIAGNOSIS — M6281 Muscle weakness (generalized): Secondary | ICD-10-CM | POA: Diagnosis not present

## 2016-07-18 DIAGNOSIS — S83241D Other tear of medial meniscus, current injury, right knee, subsequent encounter: Secondary | ICD-10-CM | POA: Diagnosis not present

## 2016-07-18 DIAGNOSIS — Z4789 Encounter for other orthopedic aftercare: Secondary | ICD-10-CM | POA: Diagnosis not present

## 2016-07-18 DIAGNOSIS — Z4801 Encounter for change or removal of surgical wound dressing: Secondary | ICD-10-CM | POA: Diagnosis not present

## 2016-07-20 ENCOUNTER — Telehealth (INDEPENDENT_AMBULATORY_CARE_PROVIDER_SITE_OTHER): Payer: Self-pay | Admitting: *Deleted

## 2016-07-20 DIAGNOSIS — M6281 Muscle weakness (generalized): Secondary | ICD-10-CM | POA: Diagnosis not present

## 2016-07-20 DIAGNOSIS — Z4801 Encounter for change or removal of surgical wound dressing: Secondary | ICD-10-CM | POA: Diagnosis not present

## 2016-07-20 DIAGNOSIS — Z4789 Encounter for other orthopedic aftercare: Secondary | ICD-10-CM | POA: Diagnosis not present

## 2016-07-20 DIAGNOSIS — F17210 Nicotine dependence, cigarettes, uncomplicated: Secondary | ICD-10-CM | POA: Diagnosis not present

## 2016-07-20 DIAGNOSIS — S83241D Other tear of medial meniscus, current injury, right knee, subsequent encounter: Secondary | ICD-10-CM | POA: Diagnosis not present

## 2016-07-20 NOTE — Telephone Encounter (Signed)
Pt. Being placed in Physical Therapy hold until after apt on 11/1. Needs future protocol instruction.

## 2016-07-20 NOTE — Telephone Encounter (Signed)
Noted, will discuss and advised further after eval on 07/25/16.

## 2016-07-25 ENCOUNTER — Encounter (INDEPENDENT_AMBULATORY_CARE_PROVIDER_SITE_OTHER): Payer: Self-pay | Admitting: Orthopedic Surgery

## 2016-07-25 ENCOUNTER — Ambulatory Visit (INDEPENDENT_AMBULATORY_CARE_PROVIDER_SITE_OTHER): Payer: Medicare Other | Admitting: Orthopedic Surgery

## 2016-07-25 DIAGNOSIS — S86811D Strain of other muscle(s) and tendon(s) at lower leg level, right leg, subsequent encounter: Secondary | ICD-10-CM | POA: Diagnosis not present

## 2016-07-25 MED ORDER — OXYCODONE HCL 5 MG PO CAPS: 5.0000 mg | ORAL_CAPSULE | Freq: Two times a day (BID) | ORAL | 0 refills | Status: DC

## 2016-07-25 NOTE — Progress Notes (Signed)
   Post-Op Visit Note   Patient: Roy Martinez           Date of Birth: 1956/07/27           MRN: [id number] Visit Date: 07/25/2016 PCP: No PCP Per Patient 60 year old 4 weeks status post chronic patellar tendon rupture repair doing well ambulating in a knee immobilizer taking 4 Percocet a day and that is discussed in regard to taper that down Patient is here s/p right knee patellar tendon rupture repair, knee scope with partial medial and lateral meniscectomies on 06/26/16. He has remained in the knee immobilizer, has not even removed it for showering.  Was afraid he would bend the knee. He is taking oxycodone for pain, with relief.  Has been doing straight leg raises, abduction exercises. He is feeling some bilateral groin pain since doing these exercises. HHPT has not came yet this week, we need to send further orders for PT.   Assessment & Plan:  Chief Complaint:  Chief Complaint  Patient presents with  . Right Knee - Routine Post Op   Visit Diagnoses: No diagnosis found.  Plan: Plan refill and taper oxicodone no calf tenderness today no evidence of DVT home health physical therapy to start knee flexion and 90 and straight leg raises hinged knee brace to be used when walking outside of the house follow-up in 4 weeks  Follow-Up Instructions: No Follow-up on file.   Orders:  No orders of the defined types were placed in this encounter.  No orders of the defined types were placed in this encounter.    PMFS History: Patient Active Problem List   Diagnosis Date Noted  . Patellar tendon rupture, right, initial encounter 06/26/2016   Past Medical History:  Diagnosis Date  . Wears glasses     Family History  Problem Relation Age of Onset  . Heart failure Mother   . Heart failure Father     Past Surgical History:  Procedure Laterality Date  . KNEE ARTHROSCOPY    . KNEE ARTHROSCOPY WITH MENISCAL REPAIR Right 06/26/2016   Procedure: KNEE ARTHROSCOPY WITH MENISCAL REPAIR VS  RESECTION;  Surgeon: Meredith Pel, MD;  Location: [place];  Service: Orthopedics;  Laterality: Right;  . PATELLAR TENDON REPAIR Right 06/26/2016   Procedure: PATELLA TENDON REPAIR;  Surgeon: Meredith Pel, MD;  Location: [place];  Service: Orthopedics;  Laterality: Right;   Social History   Occupational History  . Not on file.   Social History Main Topics  . Smoking status: Current Every Day Smoker    Packs/day: 0.50    Types: Cigarettes  . Smokeless tobacco: Never Used  . Alcohol use 3.0 - 3.6 oz/week    5 - 6 Cans of beer per week     Comment: 5-6 beers daily (pt. states he stopped 05/2016)  . Drug use: No  . Sexual activity: No

## 2016-07-30 ENCOUNTER — Telehealth (INDEPENDENT_AMBULATORY_CARE_PROVIDER_SITE_OTHER): Payer: Self-pay | Admitting: *Deleted

## 2016-07-30 NOTE — Telephone Encounter (Signed)
Amy from [facility] for orders clarification, Pt stated he could start range of motion but the Therapist doesn't have these orders. Amy requesting call back [phone].

## 2016-08-01 ENCOUNTER — Telehealth (INDEPENDENT_AMBULATORY_CARE_PROVIDER_SITE_OTHER): Payer: Self-pay | Admitting: Orthopedic Surgery

## 2016-08-01 NOTE — Telephone Encounter (Signed)
IC Amy and advised per note.

## 2016-08-01 NOTE — Telephone Encounter (Signed)
Roy Martinez (PT) with [facility] called needing to know there were any changes in the orders for (PT)  The number to contact her is [phone]

## 2016-08-01 NOTE — Telephone Encounter (Signed)
Per last chart note-  home health physical therapy to start knee flexion and 90 and straight leg raises hinged knee brace to be used when walking outside of the house follow-up in 4 weeks-

## 2016-08-03 NOTE — Telephone Encounter (Signed)
Amy from [facility] called stating pt was un avaiable for PT this week, Amy will reassess him next week. [phone]

## 2016-08-03 NOTE — Telephone Encounter (Signed)
See note from PT

## 2016-08-07 DIAGNOSIS — Z4801 Encounter for change or removal of surgical wound dressing: Secondary | ICD-10-CM | POA: Diagnosis not present

## 2016-08-07 DIAGNOSIS — S83241D Other tear of medial meniscus, current injury, right knee, subsequent encounter: Secondary | ICD-10-CM | POA: Diagnosis not present

## 2016-08-07 DIAGNOSIS — M6281 Muscle weakness (generalized): Secondary | ICD-10-CM | POA: Diagnosis not present

## 2016-08-07 DIAGNOSIS — Z4789 Encounter for other orthopedic aftercare: Secondary | ICD-10-CM | POA: Diagnosis not present

## 2016-08-07 DIAGNOSIS — F17210 Nicotine dependence, cigarettes, uncomplicated: Secondary | ICD-10-CM | POA: Diagnosis not present

## 2016-08-07 NOTE — Telephone Encounter (Signed)
Pt. Called stating he lost his paper prescription of OxyContin. He looked everywhere and cannot find it. Pt call back number is [phone]

## 2016-08-07 NOTE — Telephone Encounter (Signed)
nope

## 2016-08-07 NOTE — Telephone Encounter (Signed)
Please advise 

## 2016-08-08 ENCOUNTER — Telehealth (INDEPENDENT_AMBULATORY_CARE_PROVIDER_SITE_OTHER): Payer: Self-pay | Admitting: Orthopedic Surgery

## 2016-08-08 NOTE — Telephone Encounter (Signed)
IC pt and advised 

## 2016-08-08 NOTE — Telephone Encounter (Signed)
Patient called asked for a call back concerning the Rx  that he lost.   The number to contact him is [phone]

## 2016-08-08 NOTE — Telephone Encounter (Signed)
[facility] for #25 norco 5/325mg , 1 po BID per [employer]. [facility] for fill on 07/10/16- I will call pt and advise.

## 2016-08-09 ENCOUNTER — Other Ambulatory Visit (INDEPENDENT_AMBULATORY_CARE_PROVIDER_SITE_OTHER): Payer: Self-pay | Admitting: Orthopedic Surgery

## 2016-08-09 ENCOUNTER — Telehealth (INDEPENDENT_AMBULATORY_CARE_PROVIDER_SITE_OTHER): Payer: Self-pay | Admitting: Orthopedic Surgery

## 2016-08-09 DIAGNOSIS — Z4801 Encounter for change or removal of surgical wound dressing: Secondary | ICD-10-CM | POA: Diagnosis not present

## 2016-08-09 DIAGNOSIS — M6281 Muscle weakness (generalized): Secondary | ICD-10-CM | POA: Diagnosis not present

## 2016-08-09 DIAGNOSIS — F17210 Nicotine dependence, cigarettes, uncomplicated: Secondary | ICD-10-CM | POA: Diagnosis not present

## 2016-08-09 DIAGNOSIS — S83241D Other tear of medial meniscus, current injury, right knee, subsequent encounter: Secondary | ICD-10-CM | POA: Diagnosis not present

## 2016-08-09 DIAGNOSIS — Z4789 Encounter for other orthopedic aftercare: Secondary | ICD-10-CM | POA: Diagnosis not present

## 2016-08-09 MED ORDER — HYDROCODONE-ACETAMINOPHEN 5-325 MG PO TABS: ORAL_TABLET | ORAL | 0 refills | Status: DC

## 2016-08-09 NOTE — Telephone Encounter (Signed)
IC pt and advised I will get a Rx ready for him, he can get it filled tomorrow,. I will have it ready for him by the end of day today.
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
## 2016-08-09 NOTE — Telephone Encounter (Signed)
Roy Martinez w/[facility] calling regarding face to face. her call back is [phone]  She has sent the form over, but has yet to receive completed.  This is the [id number]

## 2016-08-09 NOTE — Telephone Encounter (Signed)
Rx done, pt aware he can pickup tomorrow

## 2016-08-15 DIAGNOSIS — S83241D Other tear of medial meniscus, current injury, right knee, subsequent encounter: Secondary | ICD-10-CM | POA: Diagnosis not present

## 2016-08-15 DIAGNOSIS — Z4801 Encounter for change or removal of surgical wound dressing: Secondary | ICD-10-CM | POA: Diagnosis not present

## 2016-08-15 DIAGNOSIS — Z4789 Encounter for other orthopedic aftercare: Secondary | ICD-10-CM | POA: Diagnosis not present

## 2016-08-15 DIAGNOSIS — M6281 Muscle weakness (generalized): Secondary | ICD-10-CM | POA: Diagnosis not present

## 2016-08-15 DIAGNOSIS — F17210 Nicotine dependence, cigarettes, uncomplicated: Secondary | ICD-10-CM | POA: Diagnosis not present

## 2016-08-15 NOTE — Telephone Encounter (Signed)
Roy Martinez W/[facility] calling regarding face to face.  cb [phone]. She sent the form over, but has yet to receive completed.  Fax  [phone].  Message was taken earlier, but may not have been routed correctly.

## 2016-08-17 DIAGNOSIS — F17210 Nicotine dependence, cigarettes, uncomplicated: Secondary | ICD-10-CM | POA: Diagnosis not present

## 2016-08-17 DIAGNOSIS — Z4789 Encounter for other orthopedic aftercare: Secondary | ICD-10-CM | POA: Diagnosis not present

## 2016-08-17 DIAGNOSIS — M6281 Muscle weakness (generalized): Secondary | ICD-10-CM | POA: Diagnosis not present

## 2016-08-17 DIAGNOSIS — S83241D Other tear of medial meniscus, current injury, right knee, subsequent encounter: Secondary | ICD-10-CM | POA: Diagnosis not present

## 2016-08-17 DIAGNOSIS — Z4801 Encounter for change or removal of surgical wound dressing: Secondary | ICD-10-CM | POA: Diagnosis not present

## 2016-08-21 ENCOUNTER — Telehealth (INDEPENDENT_AMBULATORY_CARE_PROVIDER_SITE_OTHER): Payer: Self-pay | Admitting: Orthopedic Surgery

## 2016-08-21 DIAGNOSIS — M6281 Muscle weakness (generalized): Secondary | ICD-10-CM | POA: Diagnosis not present

## 2016-08-21 DIAGNOSIS — S83241D Other tear of medial meniscus, current injury, right knee, subsequent encounter: Secondary | ICD-10-CM | POA: Diagnosis not present

## 2016-08-21 DIAGNOSIS — F17210 Nicotine dependence, cigarettes, uncomplicated: Secondary | ICD-10-CM | POA: Diagnosis not present

## 2016-08-21 DIAGNOSIS — Z4801 Encounter for change or removal of surgical wound dressing: Secondary | ICD-10-CM | POA: Diagnosis not present

## 2016-08-21 DIAGNOSIS — Z4789 Encounter for other orthopedic aftercare: Secondary | ICD-10-CM | POA: Diagnosis not present

## 2016-08-21 NOTE — Telephone Encounter (Signed)
Error

## 2016-08-23 DIAGNOSIS — F17210 Nicotine dependence, cigarettes, uncomplicated: Secondary | ICD-10-CM | POA: Diagnosis not present

## 2016-08-23 DIAGNOSIS — Z4801 Encounter for change or removal of surgical wound dressing: Secondary | ICD-10-CM | POA: Diagnosis not present

## 2016-08-23 DIAGNOSIS — M6281 Muscle weakness (generalized): Secondary | ICD-10-CM | POA: Diagnosis not present

## 2016-08-23 DIAGNOSIS — S83241D Other tear of medial meniscus, current injury, right knee, subsequent encounter: Secondary | ICD-10-CM | POA: Diagnosis not present

## 2016-08-23 DIAGNOSIS — Z4789 Encounter for other orthopedic aftercare: Secondary | ICD-10-CM | POA: Diagnosis not present

## 2016-08-23 NOTE — Telephone Encounter (Signed)
All pending forms signed and faxed to them now

## 2016-08-29 ENCOUNTER — Ambulatory Visit (INDEPENDENT_AMBULATORY_CARE_PROVIDER_SITE_OTHER): Payer: Medicare Other | Admitting: Orthopedic Surgery

## 2016-08-29 ENCOUNTER — Encounter (INDEPENDENT_AMBULATORY_CARE_PROVIDER_SITE_OTHER): Payer: Self-pay | Admitting: Orthopedic Surgery

## 2016-08-29 DIAGNOSIS — S86811D Strain of other muscle(s) and tendon(s) at lower leg level, right leg, subsequent encounter: Secondary | ICD-10-CM

## 2016-08-29 MED ORDER — OXYCODONE HCL 5 MG PO CAPS: 5.0000 mg | ORAL_CAPSULE | Freq: Every day | ORAL | 0 refills | Status: DC | PRN

## 2016-08-29 NOTE — Progress Notes (Signed)
   Post-Op Visit Note   Patient: Roy Martinez           Date of Birth: 07/03/1956           MRN: [id number] Visit Date: 08/29/2016 PCP: No PCP Per Patient   Assessment & Plan:  Chief Complaint:  Chief Complaint  Patient presents with  . Right Knee - Routine Post Op   Visit Diagnoses:  1. 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  He is 2 months out from surgery.  On exam he still has about 15 extensor lag but the tendon itself is functional and intact.  No effusion is noted in the right knee.  No calf tenderness is present plan patient is doing well following surgery he is functional but not perfect.  Nothing really more to be done.  He is finished physical therapy.  He is to wean off of his oxycodone.  No more refills on that medication after today follow up with me as needed  Follow-Up Instructions: Return if symptoms worsen or fail to improve.   Orders:  No orders of the defined types were placed in this encounter.  Meds ordered this encounter  Medications  . oxycodone (OXY-IR) 5 MG capsule    Sig: Take 1 capsule (5 mg total) by mouth daily as needed.    Dispense:  30 capsule    Refill:  0     PMFS History: Patient Active Problem List   Diagnosis Date Noted  . Patellar tendon rupture, right, initial encounter 06/26/2016   Past Medical History:  Diagnosis Date  . Wears glasses     Family History  Problem Relation Age of Onset  . Heart failure Mother   . Heart failure Father     Past Surgical History:  Procedure Laterality Date  . KNEE ARTHROSCOPY    . KNEE ARTHROSCOPY WITH MENISCAL REPAIR Right 06/26/2016   Procedure: KNEE ARTHROSCOPY WITH MENISCAL REPAIR VS RESECTION;  Surgeon: Meredith Pel, MD;  Location: [place];  Service: Orthopedics;  Laterality: Right;  . PATELLAR TENDON REPAIR Right 06/26/2016   Procedure: PATELLA TENDON  REPAIR;  Surgeon: Meredith Pel, MD;  Location: [place];  Service: Orthopedics;  Laterality: Right;   Social History   Occupational History  . Not on file.   Social History Main Topics  . Smoking status: Current Every Day Smoker    Packs/day: 0.50    Types: Cigarettes  . Smokeless tobacco: Never Used  . Alcohol use 3.0 - 3.6 oz/week    5 - 6 Cans of beer per week     Comment: 5-6 beers daily (pt. states he stopped 05/2016)  . Drug use: No  . Sexual activity: No

## 2016-09-03 NOTE — Telephone Encounter (Signed)
Patient is requesting a letter stating that he is under Dr.Deans care for right knee pain.  Cb#: [phone]

## 2016-09-05 NOTE — Telephone Encounter (Signed)
See note from patient, your last note states followup as needed.  Please advise.

## 2016-09-05 NOTE — Telephone Encounter (Signed)
pls give him note of my start and finish dates of care thx

## 2016-09-06 NOTE — Telephone Encounter (Signed)
Note done, IC pt and advised he can pickup note
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
## 2017-01-25 ENCOUNTER — Ambulatory Visit (HOSPITAL_COMMUNITY)
Admission: EM | Admit: 2017-01-25 | Discharge: 2017-01-25 | Disposition: A | Discharge status: Home / Self Care | Payer: Medicare Other | Attending: Internal Medicine | Admitting: Internal Medicine

## 2017-01-25 ENCOUNTER — Encounter (HOSPITAL_COMMUNITY): Payer: Self-pay | Admitting: Emergency Medicine

## 2017-01-25 DIAGNOSIS — B1920 Unspecified viral hepatitis C without hepatic coma: Secondary | ICD-10-CM | POA: Insufficient documentation

## 2017-01-25 DIAGNOSIS — R1013 Epigastric pain: Secondary | ICD-10-CM | POA: Insufficient documentation

## 2017-01-25 DIAGNOSIS — F17210 Nicotine dependence, cigarettes, uncomplicated: Secondary | ICD-10-CM | POA: Diagnosis not present

## 2017-01-25 DIAGNOSIS — M791 Myalgia: Secondary | ICD-10-CM | POA: Insufficient documentation

## 2017-01-25 DIAGNOSIS — Z8619 Personal history of other infectious and parasitic diseases: Secondary | ICD-10-CM | POA: Diagnosis not present

## 2017-01-25 DIAGNOSIS — S76111A Strain of right quadriceps muscle, fascia and tendon, initial encounter: Secondary | ICD-10-CM | POA: Insufficient documentation

## 2017-01-25 LAB — PROTIME-INR
INR: 1
Prothrombin Time: 13.2 seconds (ref 11.4–15.2)

## 2017-01-25 LAB — COMPREHENSIVE METABOLIC PANEL
ALBUMIN: 3.7 g/dL (ref 3.5–5.0)
ALT: 41 U/L (ref 17–63)
ANION GAP: 9 (ref 5–15)
AST: 47 U/L — AB (ref 15–41)
Alkaline Phosphatase: 62 U/L (ref 38–126)
BILIRUBIN TOTAL: 1 mg/dL (ref 0.3–1.2)
BUN: 14 mg/dL (ref 6–20)
CO2: 23 mmol/L (ref 22–32)
Calcium: 9 mg/dL (ref 8.9–10.3)
Chloride: 105 mmol/L (ref 101–111)
Creatinine, Ser: 0.74 mg/dL (ref 0.61–1.24)
GFR calc Af Amer: >60 mL/min (ref >60)
GFR calc non Af Amer: >60 mL/min (ref >60)
GLUCOSE: 100 mg/dL — AB (ref 65–99)
Potassium: 3.6 mmol/L (ref 3.5–5.1)
SODIUM: 137 mmol/L (ref 135–145)
TOTAL PROTEIN: 7.3 g/dL (ref 6.5–8.1)

## 2017-01-25 LAB — CBC WITH DIFFERENTIAL/PLATELET
Basophils Absolute: 0 10*3/uL (ref 0.0–0.1)
Basophils Relative: 0 %
EOS PCT: 3 %
Eosinophils Absolute: 0.2 10*3/uL (ref 0.0–0.7)
HCT: 43.3 % (ref 39.0–52.0)
Hemoglobin: 14.3 g/dL (ref 13.0–17.0)
LYMPHS ABS: 2.6 10*3/uL (ref 0.7–4.0)
LYMPHS PCT: 48 %
MCH: 31.4 pg (ref 26.0–34.0)
MCHC: 33 g/dL (ref 30.0–36.0)
MCV: 95 fL (ref 78.0–100.0)
MONO ABS: 0.6 10*3/uL (ref 0.1–1.0)
MONOS PCT: 12 %
Neutro Abs: 2.1 10*3/uL (ref 1.7–7.7)
Neutrophils Relative %: 37 %
PLATELETS: 260 10*3/uL (ref 150–400)
RBC: 4.56 MIL/uL (ref 4.22–5.81)
RDW: 12.6 % (ref 11.5–15.5)
WBC: 5.5 10*3/uL (ref 4.0–10.5)

## 2017-01-25 MED ORDER — OMEPRAZOLE 40 MG PO CPDR: 40.0000 mg | DELAYED_RELEASE_CAPSULE | Freq: Two times a day (BID) | ORAL | 1 refills | Status: DC

## 2017-01-25 NOTE — ED Triage Notes (Signed)
The patient presented to the [facility] with a complaint of pain on the right side of his torso that he could not pin point to one location or starting point. The patient stated that the pain is off and on x 2 days.

## 2017-01-25 NOTE — Discharge Instructions (Addendum)
Blood work was drawn today for hepatitis, blood counts, liver and kidney tests.  The urgent care will contact you with the results.  Cut back on your drinking and stop if possible.  Prescription for omeprazole (prilosec) for stomach acid sent to the [place] at [facility].  Make appointment with [place] clinic to establish care and treatment plan for hepatitis C history and for support with cutting back/stopping alcohol.

## 2017-01-25 NOTE — ED Provider Notes (Signed)
[facility]    CSN: [id number] Arrival date & time: 01/25/17  1740     History   Chief Complaint Chief Complaint  Patient presents with  . Muscle Pain    HPI Roy Martinez is a 61 y.o. male. He presents today with a couple days history of left upper abdominal discomfort, drank more than usual the last 2 evenings. Would like to stop drinking. Not vomiting, eating helps pain temporarily. Normal bowel movement this morning, brown. No fever. Sometimes urine is dark, but not brown. Was told when he gave blood several years ago that he was hep C positive.  Son has encouraged him to consider getting treated for this.     HPI  Past Medical History:  Diagnosis Date  . Wears glasses     Patient Active Problem List   Diagnosis Date Noted  . Patellar tendon rupture, right, initial encounter 06/26/2016    Past Surgical History:  Procedure Laterality Date  . KNEE ARTHROSCOPY    . KNEE ARTHROSCOPY WITH MENISCAL REPAIR Right 06/26/2016   Procedure: KNEE ARTHROSCOPY WITH MENISCAL REPAIR VS RESECTION;  Surgeon: Meredith Pel, MD;  Location: [place];  Service: Orthopedics;  Laterality: Right;  . PATELLAR TENDON REPAIR Right 06/26/2016   Procedure: PATELLA TENDON REPAIR;  Surgeon: Meredith Pel, MD;  Location: [place];  Service: Orthopedics;  Laterality: Right;       Home Medications    Prior to Admission medications   Medication Sig Start Date End Date Taking? Authorizing Provider  omeprazole (PRILOSEC) 40 MG capsule Take 1 capsule (40 mg total) by mouth 2 (two) times daily before a meal. 01/25/17 02/24/17  Sherlene Shams, MD    Family History Family History  Problem Relation Age of Onset  . Heart failure Mother   . Heart failure Father     Social History Social History  Substance Use Topics  . Smoking status: Current Every Day Smoker    Packs/day: 0.50    Types: Cigarettes  . Smokeless tobacco: Never Used  . Alcohol use 3.0 - 3.6 oz/week    5 - 6 Cans of  beer per week     Comment: 5-6 beers daily (pt. states he stopped 05/2016)     Allergies   Penicillins   Review of Systems Review of Systems  All other systems reviewed and are negative.    Physical Exam Triage Vital Signs ED Triage Vitals  Enc Vitals Group     BP 01/25/17 1810 115/65     Pulse Rate 01/25/17 1810 (!) 52     Resp 01/25/17 1810 18     Temp 01/25/17 1810 97.9 F (36.6 C)     Temp Source 01/25/17 1810 Oral     SpO2 01/25/17 1810 98 %     Weight --      Height --      Pain Score 01/25/17 1809 5     Pain Loc --    Updated Vital Signs BP 115/65 (BP Location: Right Arm)   Pulse (!) 52   Temp 97.9 F (36.6 C) (Oral)   Resp 18   SpO2 98%   Physical Exam  Constitutional: He is oriented to person, place, and time. No distress.  Alert, nicely groomed  HENT:  Head: Atraumatic.  Eyes:  Conjugate gaze, no eye redness/drainage  Neck: Neck supple.  Cardiovascular: Regular rhythm.   Heart rate 50s  Pulmonary/Chest: No respiratory distress.  Lungs clear, symmetric breath sounds  Abdominal: Soft.  There is no tenderness. There is no rebound and no guarding.  Small umbilical hernia, reducible. No focal tenderness to palpation. Abdomen is protuberant, possibly slightly distended.  Musculoskeletal: Normal range of motion.  Neurological: He is alert and oriented to person, place, and time.  Skin: Skin is warm and dry.  No cyanosis  Nursing note and vitals reviewed.    UC Treatments / Results  Labs Results for orders placed or performed during the hospital encounter of 01/25/17  Comprehensive metabolic panel  Result Value Ref Range   Sodium 137 135 - 145 mmol/L   Potassium 3.6 3.5 - 5.1 mmol/L   Chloride 105 101 - 111 mmol/L   CO2 23 22 - 32 mmol/L   Glucose, Bld 100 (H) 65 - 99 mg/dL   BUN 14 6 - 20 mg/dL   Creatinine, Ser 0.74 0.61 - 1.24 mg/dL   Calcium 9.0 8.9 - 10.3 mg/dL   Total Protein 7.3 6.5 - 8.1 g/dL   Albumin 3.7 3.5 - 5.0 g/dL   AST 47 (H)  15 - 41 U/L   ALT 41 17 - 63 U/L   Alkaline Phosphatase 62 38 - 126 U/L   Total Bilirubin 1.0 0.3 - 1.2 mg/dL   GFR calc non Af Amer >60 >60 mL/min   GFR calc Af Amer >60 >60 mL/min   Anion gap 9 5 - 15  CBC with Differential  Result Value Ref Range   WBC 5.5 4.0 - 10.5 K/uL   RBC 4.56 4.22 - 5.81 MIL/uL   Hemoglobin 14.3 13.0 - 17.0 g/dL   HCT 43.3 39.0 - 52.0 %   MCV 95.0 78.0 - 100.0 fL   MCH 31.4 26.0 - 34.0 pg   MCHC 33.0 30.0 - 36.0 g/dL   RDW 12.6 11.5 - 15.5 %   Platelets 260 150 - 400 K/uL   Neutrophils Relative % 37 %   Neutro Abs 2.1 1.7 - 7.7 K/uL   Lymphocytes Relative 48 %   Lymphs Abs 2.6 0.7 - 4.0 K/uL   Monocytes Relative 12 %   Monocytes Absolute 0.6 0.1 - 1.0 K/uL   Eosinophils Relative 3 %   Eosinophils Absolute 0.2 0.0 - 0.7 K/uL   Basophils Relative 0 %   Basophils Absolute 0.0 0.0 - 0.1 K/uL  Protime-INR  Result Value Ref Range   Prothrombin Time 13.2 11.4 - 15.2 seconds   INR 1.00     Procedures Procedures (including critical care time) None today  Final Clinical Impressions(s) / UC Diagnoses   Final diagnoses:  History of hepatitis C  Acute epigastric pain   Blood work was drawn today for hepatitis, blood counts, liver and kidney tests.  The urgent care will contact you with the results.  Cut back on your drinking and stop if possible.  Prescription for omeprazole (prilosec) for stomach acid sent to the [place] at [facility].  Make appointment with [place] clinic to establish care and treatment plan for hepatitis C history and for support with cutting back/stopping alcohol.   New Prescriptions New Prescriptions   OMEPRAZOLE (PRILOSEC) 40 MG CAPSULE    Take 1 capsule (40 mg total) by mouth 2 (two) times daily before a meal.     Sherlene Shams, MD 01/26/17 2220

## 2017-01-28 LAB — HEPATITIS PANEL, ACUTE
HCV Ab: >11 s/co ratio — ABNORMAL HIGH (ref 0.0–0.9)
Hep A IgM: NEGATIVE
Hep B C IgM: NEGATIVE
Hepatitis B Surface Ag: NEGATIVE

## 2017-02-14 ENCOUNTER — Ambulatory Visit (INDEPENDENT_AMBULATORY_CARE_PROVIDER_SITE_OTHER): Payer: Medicare Other | Admitting: Physician Assistant

## 2017-02-14 ENCOUNTER — Encounter (INDEPENDENT_AMBULATORY_CARE_PROVIDER_SITE_OTHER): Payer: Self-pay | Admitting: Physician Assistant

## 2017-02-14 VITALS — BP 128/78 | HR 61 | Temp 98.0°F | Ht 73.0 in | Wt 185.4 lb

## 2017-02-14 DIAGNOSIS — R768 Other specified abnormal immunological findings in serum: Secondary | ICD-10-CM

## 2017-02-14 DIAGNOSIS — R740 Nonspecific elevation of levels of transaminase and lactic acid dehydrogenase [LDH]: Secondary | ICD-10-CM

## 2017-02-14 DIAGNOSIS — F1020 Alcohol dependence, uncomplicated: Secondary | ICD-10-CM | POA: Diagnosis not present

## 2017-02-14 DIAGNOSIS — R7401 Elevation of levels of liver transaminase levels: Secondary | ICD-10-CM

## 2017-02-14 NOTE — Patient Instructions (Addendum)
Alcohol Use Disorder Alcohol use disorder is when your drinking disrupts your daily life. When you have this condition, you drink too much alcohol and you cannot control your drinking. Alcohol use disorder can cause serious problems with your physical health. It can affect your brain, heart, liver, pancreas, immune system, stomach, and intestines. Alcohol use disorder can increase your risk for certain cancers and cause problems with your mental health, such as depression, anxiety, psychosis, delirium, and dementia. People with this disorder risk hurting themselves and others. What are the causes? This condition is caused by drinking too much alcohol over time. It is not caused by drinking too much alcohol only one or two times. Some people with this condition drink alcohol to cope with or escape from negative life events. Others drink to relieve pain or symptoms of mental illness. What increases the risk? You are more likely to develop this condition if:  You have a family history of alcohol use disorder.  Your culture encourages drinking to the point of intoxication, or makes alcohol easy to get.  You had a mood or conduct disorder in childhood.  You have been a victim of abuse.  You are an adolescent and:  You have poor grades or difficulties in school.  Your caregivers do not talk to you about saying no to alcohol, or supervise your activities.  You are impulsive or you have trouble with self-control. What are the signs or symptoms? Symptoms of this condition include:  Drinkingmore than you want to.  Drinking for longer than you want to.  Trying several times to drink less or to control your drinking.  Spending a lot of time getting alcohol, drinking, or recovering from drinking.  Craving alcohol.  Having problems at work, at school, or at home due to drinking.  Having problems in relationships due to drinking.  Drinking when it is dangerous to drink, such as before  driving a car.  Continuing to drink even though you know you might have a physical or mental problem related to drinking.  Needing more and more alcohol to get the same effect you want from the alcohol (building up tolerance).  Having symptoms of withdrawal when you stop drinking. Symptoms of withdrawal include:  Fatigue.  Nightmares.  Trouble sleeping.  Depression.  Anxiety.  Fever.  Seizures.  Severe confusion.  Feeling or seeing things that are not there (hallucinations).  Tremors.  Rapid heart rate.  Rapid breathing.  High blood pressure.  Drinking to avoid symptoms of withdrawal. How is this diagnosed? This condition is diagnosed with an assessment. Your health care provider may start the assessment by asking three or four questions about your drinking. Your health care provider may perform a physical exam or do lab tests to see if you have physical problems resulting from alcohol use. She or he may refer you to a mental health professional for evaluation. How is this treated? Some people with alcohol use disorder are able to reduce their alcohol use to low-risk levels. Others need to completely quit drinking alcohol. When necessary, mental health professionals with specialized training in substance use treatment can help. Your health care provider can help you decide how severe your alcohol use disorder is and what type of treatment you need. The following forms of treatment are available:  Detoxification. Detoxification involves quitting drinking and using prescription medicines within the first week to help lessen withdrawal symptoms. This treatment is important for people who have had withdrawal symptoms before and for heavy drinkers  who are likely to have withdrawal symptoms. Alcohol withdrawal can be dangerous, and in severe cases, it can cause death. Detoxification may be provided in a home, community, or primary care setting, or in a hospital or substance use  treatment facility.  Counseling. This treatment is also called talk therapy. It is provided by substance use treatment counselors. A counselor can address the reasons you use alcohol and suggest ways to keep you from drinking again or to prevent problem drinking. The goals of talk therapy are to:  Find healthy activities and ways for you to cope with stress.  Identify and avoid the things that trigger your alcohol use.  Help you learn how to handle cravings.  Medicines.Medicines can help treat alcohol use disorder by:  Decreasing alcohol cravings.  Decreasing the positive feeling you have when you drink alcohol.  Causing an uncomfortable physical reaction when you drink alcohol (aversion therapy).  Support groups. Support groups are led by people who have quit drinking. They provide emotional support, advice, and guidance. These forms of treatment are often combined. Some people with this condition benefit from a combination of treatments provided by specialized substance use treatment centers. Follow these instructions at home:  Take over-the-counter and prescription medicines only as told by your health care provider.  Check with your health care provider before starting any new medicines.  Ask friends and family members not to offer you alcohol.  Avoid situations where alcohol is served, including gatherings where others are drinking alcohol.  Create a plan for what to do when you are tempted to use alcohol.  Find hobbies or activities that you enjoy that do not include alcohol.  Keep all follow-up visits as told by your health care provider. This is important. How is this prevented?  If you drink, limit alcohol intake to no more than 1 drink a day for nonpregnant women and 2 drinks a day for men. One drink equals 12 oz of beer, 5 oz of wine, or 1 oz of hard liquor.  If you have a mental health condition, get treatment and support.  Do not give alcohol to  adolescents.  If you are an adolescent:  Do not drink alcohol.  Do not be afraid to say no if someone offers you alcohol. Speak up about why you do not want to drink. You can be a positive role model for your friends and set a good example for those around you by not drinking alcohol.  If your friends drink, spend time with others who do not drink alcohol. Make new friends who do not use alcohol.  Find healthy ways to manage stress and emotions, such as meditation or deep breathing, exercise, spending time in nature, listening to music, or talking with a trusted friend or family member. Contact a health care provider if:  You are not able to take your medicines as told.  Your symptoms get worse.  You return to drinking alcohol (relapse) and your symptoms get worse. Get help right away if:  You have thoughts about hurting yourself or others. If you ever feel like you may hurt yourself or others, or have thoughts about taking your own life, get help right away. You can go to your nearest emergency department or call:  Your local emergency services (911 in the [place]  A suicide crisis helpline, such as the [facility] at 2405613510. This is open 24 hours a day. Summary  Alcohol use disorder is when your drinking disrupts your daily  life. When you have this condition, you drink too much alcohol and you cannot control your drinking.  Treatment may include detoxification, counseling, medicine, and support groups.  Ask friends and family members not to offer you alcohol. Avoid situations where alcohol is served.  Get help right away if you have thoughts about hurting yourself or others. This information is not intended to replace advice given to you by your health care provider. Make sure you discuss any questions you have with your health care provider. Document Released: 10/18/2004 Document Revised: 06/07/2016 Document Reviewed: 06/07/2016 Elsevier  Interactive Patient Education  2017 Elsevier Inc. Hepatitis C Hepatitis C is a viral infection of the liver. It can lead to scarring of the liver (cirrhosis), liver failure, or liver cancer. Hepatitis C may go undetected for months or years because people with the infection may not have symptoms, or they may have only mild symptoms. What are the causes? Hepatitis C is caused by the hepatitis C virus (HCV). The virus can be passed from one person to another through:  Blood.  Contaminated needles, such as those used for tattooing, body piercing, acupuncture, or injecting drugs.  Having unprotected sex with an infected person.  Childbirth.  Blood transfusions or organ transplants done in the [place] before 1992. What increases the risk? Risk factors for hepatitis C include:  Having unprotected sex with an infected person.  Using illegal drugs. What are the signs or symptoms? Symptoms of hepatitis C may include:  Fatigue.  Loss of appetite.  Nausea.  Vomiting.  Abdominal pain.  Dark yellow urine.  Yellowish skin and eyes (jaundice).  Itching of the skin.  Clay-colored bowel movements.  Joint pain. Symptoms are not always present. How is this diagnosed? Hepatitis C is diagnosed with blood tests. Other types of tests may also be done to check how your liver is functioning. How is this treated? Your health care provider may perform noninvasive tests or a liver biopsy to help determine the best course of treatment. Treatment for hepatitis C may include one or more medicines. Your health care provider may check you for a recurring infection or other liver conditions every 6-12 months after treatment. Follow these instructions at home:  Rest as needed.  Take all medicines as directed by your health care provider.  Do not take any medicine unless approved by your health care provider. This includes over-the-counter medicine and birth control pills.  Do not drink  alcohol.  Do not have sex until approved by your health care provider.  Do not share toothbrushes, nail clippers, razors, or needles. How is this prevented? There is no vaccine for hepatitis C. The only way to prevent the disease is to reduce the risk of exposure to the virus. This may be done by:  Practicing safe sex and using condoms.  Avoiding illegal drugs. Contact a health care provider if:  You have a fever.  You develop abdominal pain.  You develop dark urine.  You have clay-colored bowel movements.  You develop joint pains. Get help right away if:  You have increasing fatigue or weakness.  You lose your appetite.  You feel nauseous or vomit.  You develop jaundice or your jaundice gets worse.  You bruise or bleed easily. This information is not intended to replace advice given to you by your health care provider. Make sure you discuss any questions you have with your health care provider. Document Released: 09/07/2000 Document Revised: 02/16/2016 Document Reviewed: 12/23/2013 Elsevier Interactive Patient Education  2017 Elsevier Inc.  

## 2017-02-14 NOTE — Progress Notes (Signed)
Subjective:  Patient ID: Roy Martinez, male    DOB: 1955/10/09  Age: 61 y.o. MRN: [id number]  CC: hepatitis  HPI Roy Martinez is a 61 y.o. male with a PMH of alcoholism presents to f/u on Urgent Care findings of hepatitis C when he reported for epigastric pain. Was prescribed omeprazole and says he no longer has epigastric pain. He is now especially concerned with the finding of positive HCV Ab. He also had slight transaminitis. Reports drinking "a couple of 40s" a few times per week. States he is not going to drink anymore since he has been made aware of liver inflammation. Had already attended AA in the remote past. Does not endorse any other symptoms.     Outpatient Medications Prior to Visit  Medication Sig Dispense Refill  . omeprazole (PRILOSEC) 40 MG capsule Take 1 capsule (40 mg total) by mouth 2 (two) times daily before a meal. (Patient not taking: Reported on 02/14/2017) 60 capsule 1   No facility-administered medications prior to visit.      ROS Review of Systems  Constitutional: Negative for chills, fever and malaise/fatigue.  Eyes: Negative for blurred vision.  Respiratory: Negative for shortness of breath.   Cardiovascular: Negative for chest pain and palpitations.  Gastrointestinal: Negative for abdominal pain and nausea.  Genitourinary: Negative for dysuria and hematuria.  Musculoskeletal: Negative for joint pain and myalgias.  Skin: Negative for rash.  Neurological: Negative for tingling and headaches.  Psychiatric/Behavioral: Negative for depression. The patient is not nervous/anxious.     Objective:  BP 128/78 (BP Location: Left Arm, Patient Position: Sitting, Cuff Size: Large)   Pulse 61   Temp 98 F (36.7 C) (Oral)   Ht 6\' 1"  (1.854 m)   Wt 185 lb 6.4 oz (84.1 kg)   SpO2 97%   BMI 24.46 kg/m   BP/Weight 02/14/2017 01/25/2017 52/03/7823  Systolic BP 235 361 443  Diastolic BP 78 65 88  Wt. (Lbs) 185.4 - -  BMI 24.46 - -      Physical Exam   Constitutional: He is oriented to person, place, and time.  Well developed, mild central obesity, NAD, polite  HENT:  Head: Normocephalic and atraumatic.  Eyes: Conjunctivae are normal. No scleral icterus.  Neck: Normal range of motion. Neck supple. No thyromegaly present.  Cardiovascular: Normal rate, regular rhythm and normal heart sounds.   Pulmonary/Chest: Effort normal and breath sounds normal.  Abdominal: Soft. Bowel sounds are normal. He exhibits no distension. There is no tenderness. There is no rebound and no guarding.  hepatomegaly  Musculoskeletal: He exhibits no edema.  Neurological: He is alert and oriented to person, place, and time. No cranial nerve deficit. Coordination normal.  Skin: Skin is warm and dry. No rash noted. No erythema. No pallor.  Psychiatric: He has a normal mood and affect. His behavior is normal. Thought content normal.  Vitals reviewed.    Assessment & Plan:   1. Hepatitis C antibody test positive - HCV RNA quant - Comprehensive metabolic panel - US Abdomen Limited RUQ; Future  2. Transaminitis - Lipid Panel  3. Alcoholism ([place]) - patient counseled on the detriment of alcohol to the liver. I have encouraged him to seek help and focus his attention to positive contributions in his community or his own life. Pt was grateful for the counseling and assured me that he will definitely quit drinking.     Follow-up: Return in about 4 weeks (around 03/14/2017) for full physical.   Clent Demark PA

## 2017-02-16 LAB — COMPREHENSIVE METABOLIC PANEL
A/G RATIO: 1.3 (ref 1.2–2.2)
ALT: 48 IU/L — AB (ref 0–44)
AST: 48 IU/L — ABNORMAL HIGH (ref 0–40)
Albumin: 4 g/dL (ref 3.6–4.8)
Alkaline Phosphatase: 75 IU/L (ref 39–117)
BUN/Creatinine Ratio: 19 (ref 10–24)
BUN: 13 mg/dL (ref 8–27)
Bilirubin Total: 0.7 mg/dL (ref 0.0–1.2)
CALCIUM: 9 mg/dL (ref 8.6–10.2)
CO2: 24 mmol/L (ref 18–29)
Chloride: 105 mmol/L (ref 96–106)
Creatinine, Ser: 0.67 mg/dL — ABNORMAL LOW (ref 0.76–1.27)
GFR calc Af Amer: 121 mL/min/{1.73_m2} (ref >59)
GFR, EST NON AFRICAN AMERICAN: 104 mL/min/{1.73_m2} (ref >59)
Globulin, Total: 3.2 g/dL (ref 1.5–4.5)
Glucose: 110 mg/dL — ABNORMAL HIGH (ref 65–99)
POTASSIUM: 3.9 mmol/L (ref 3.5–5.2)
Sodium: 141 mmol/L (ref 134–144)
TOTAL PROTEIN: 7.2 g/dL (ref 6.0–8.5)

## 2017-02-16 LAB — LIPID PANEL
CHOL/HDL RATIO: 1.9 ratio (ref 0.0–5.0)
Cholesterol, Total: 172 mg/dL (ref 100–199)
HDL: 90 mg/dL (ref >39)
LDL Calculated: 66 mg/dL (ref 0–99)
Triglycerides: 82 mg/dL (ref 0–149)
VLDL CHOLESTEROL CAL: 16 mg/dL (ref 5–40)

## 2017-02-16 LAB — HCV RNA QUANT
HCV log10: 6.314 log10 IU/mL
Hepatitis C Quantitation: 2060000 IU/mL

## 2017-02-20 ENCOUNTER — Ambulatory Visit (HOSPITAL_COMMUNITY): Payer: Medicare Other

## 2017-02-20 ENCOUNTER — Other Ambulatory Visit (INDEPENDENT_AMBULATORY_CARE_PROVIDER_SITE_OTHER): Payer: Self-pay | Admitting: Physician Assistant

## 2017-02-20 DIAGNOSIS — B1920 Unspecified viral hepatitis C without hepatic coma: Secondary | ICD-10-CM

## 2017-02-20 NOTE — Progress Notes (Signed)
Hep C positive with elevated viral load.

## 2017-02-22 ENCOUNTER — Ambulatory Visit (HOSPITAL_COMMUNITY)
Admission: RE | Admit: 2017-02-22 | Discharge: 2017-02-22 | Disposition: A | Discharge status: Home / Self Care | Payer: Medicare Other | Source: Ambulatory Visit | Attending: Physician Assistant | Admitting: Physician Assistant

## 2017-02-22 DIAGNOSIS — K7689 Other specified diseases of liver: Secondary | ICD-10-CM | POA: Diagnosis not present

## 2017-02-22 DIAGNOSIS — R768 Other specified abnormal immunological findings in serum: Secondary | ICD-10-CM | POA: Diagnosis not present

## 2017-02-22 DIAGNOSIS — K8020 Calculus of gallbladder without cholecystitis without obstruction: Secondary | ICD-10-CM | POA: Diagnosis not present

## 2017-03-07 ENCOUNTER — Telehealth: Payer: Self-pay | Admitting: *Deleted

## 2017-03-07 NOTE — Telephone Encounter (Signed)
-----   Message from Clent Demark, PA-C sent at 02/20/2017  8:24 AM EDT ----- Hep C Viral load elevated. Cholesterol normal. Remember to get RUQ US done. I will put in infectious disease referral.

## 2017-03-07 NOTE — Telephone Encounter (Signed)
Patient verified DOB Patient is aware of Hep C viral load being elevated. Patient has an appointment with [facility] on 03/11/17. Patient has a FU with roger on 03/19/17. Patient expressed his understanding and had no further questions.

## 2017-03-11 ENCOUNTER — Encounter: Payer: Self-pay | Admitting: Internal Medicine

## 2017-03-11 ENCOUNTER — Ambulatory Visit (INDEPENDENT_AMBULATORY_CARE_PROVIDER_SITE_OTHER): Payer: Medicare Other | Admitting: Internal Medicine

## 2017-03-11 ENCOUNTER — Telehealth: Payer: Self-pay | Admitting: *Deleted

## 2017-03-11 VITALS — Temp 97.9°F | Ht 73.0 in | Wt 187.0 lb

## 2017-03-11 DIAGNOSIS — Z23 Encounter for immunization: Secondary | ICD-10-CM

## 2017-03-11 DIAGNOSIS — B182 Chronic viral hepatitis C: Secondary | ICD-10-CM | POA: Diagnosis not present

## 2017-03-11 LAB — COMPLETE METABOLIC PANEL WITH GFR
ALT: 32 U/L (ref 9–46)
AST: 35 U/L (ref 10–35)
Albumin: 3.7 g/dL (ref 3.6–5.1)
Alkaline Phosphatase: 65 U/L (ref 40–115)
BILIRUBIN TOTAL: 1 mg/dL (ref 0.2–1.2)
BUN: 19 mg/dL (ref 7–25)
CHLORIDE: 104 mmol/L (ref 98–110)
CO2: 24 mmol/L (ref 20–31)
Calcium: 8.8 mg/dL (ref 8.6–10.3)
Creat: 0.72 mg/dL (ref 0.70–1.25)
Glucose, Bld: 103 mg/dL — ABNORMAL HIGH (ref 65–99)
Potassium: 4 mmol/L (ref 3.5–5.3)
Sodium: 136 mmol/L (ref 135–146)
Total Protein: 7.1 g/dL (ref 6.1–8.1)

## 2017-03-11 LAB — CBC WITH DIFFERENTIAL/PLATELET
BASOS PCT: 0 %
Basophils Absolute: 0 cells/uL (ref 0–200)
EOS ABS: 235 {cells}/uL (ref 15–500)
Eosinophils Relative: 5 %
HCT: 42.4 % (ref 38.5–50.0)
Hemoglobin: 14 g/dL (ref 13.2–17.1)
Lymphocytes Relative: 43 %
Lymphs Abs: 2021 cells/uL (ref 850–3900)
MCH: 31.3 pg (ref 27.0–33.0)
MCHC: 33 g/dL (ref 32.0–36.0)
MCV: 94.9 fL (ref 80.0–100.0)
MONO ABS: 517 {cells}/uL (ref 200–950)
MONOS PCT: 11 %
MPV: 9.5 fL (ref 7.5–12.5)
NEUTROS ABS: 1927 {cells}/uL (ref 1500–7800)
Neutrophils Relative %: 41 %
PLATELETS: 287 10*3/uL (ref 140–400)
RBC: 4.47 MIL/uL (ref 4.20–5.80)
RDW: 13.4 % (ref 11.0–15.0)
WBC: 4.7 10*3/uL (ref 3.8–10.8)

## 2017-03-11 NOTE — Telephone Encounter (Signed)
Patient notified of appt for ultrasound on 03/20/17 at 9:15 AM at [facility]. Nothing to eat or drink after midnight. Roy Martinez

## 2017-03-11 NOTE — Progress Notes (Signed)
[facility] for [facility]   CC: consideration for treatment for chronic hepatitis C  HPI:  +Roy Martinez is a 61 y.o. male who presents for initial evaluation and management of chronic hepatitis C.  Patient tested positive earlier this year during evaluation for transaminitis and epigastric pain. Hepatitis C-associated risk factors present are: IV drug abuse (details: over 10 years ago). Patient denies intranasal drug use, renal dialysis, sexual contact with person with liver disease, tattoos. Patient has had other studies performed. Results: hepatitis C RNA by PCR, result: positive. Patient has not had prior treatment for Hepatitis C. Patient does not have a past history of liver disease. Patient does not have a family history of liver disease. Patient does not  have associated signs or symptoms related to liver disease.  Labs reviewed and confirm chronic hepatitis C with a positive viral load.   Records reviewed from Epic, he does have a significant alcohol history and is drinking some now but down to just a couple of glasses of wine.  He does have a positive viral load.      Patient does not have documented immunity to Hepatitis A. Patient does not have documented immunity to Hepatitis B.    Review of Systems:  Constitutional: negative for fatigue, malaise and anorexia Gastrointestinal: negative for diarrhea Musculoskeletal: positive for stiff joints, negative for myalgias All other systems reviewed and are negative       Past Medical History:  Diagnosis Date  . Wears glasses     Prior to Admission medications   Medication Sig Start Date End Date Taking? Authorizing Provider  omeprazole (PRILOSEC) 40 MG capsule Take 1 capsule (40 mg total) by mouth 2 (two) times daily before a meal. Patient not taking: Reported on 02/14/2017 01/25/17 02/24/17  Sherlene Shams, MD    Allergies  Allergen Reactions  . Penicillins Hives    Childhood allergy Has patient had a PCN  reaction causing immediate rash, facial/tongue/throat swelling, SOB or lightheadedness with hypotension: No Has patient had a PCN reaction causing severe rash involving mucus membranes or skin necrosis: No Has patient had a PCN reaction that required hospitalization No Has patient had a PCN reaction occurring within the last 10 years: No If all of the above answers are "NO", then may proceed with Cephalosporin use.     Social History  Substance Use Topics  . Smoking status: Current Every Day Smoker    Packs/day: 0.50    Types: Cigarettes  . Smokeless tobacco: Never Used     Comment: cutting back  . Alcohol use 3.0 - 3.6 oz/week    5 - 6 Cans of beer per week     Comment: pt states daily beer drinker    Family History  Problem Relation Age of Onset  . Heart failure Mother   . Heart failure Father   no cirrhosis   Objective:  Constitutional: in no apparent distress,  Vitals:   03/11/17 0918  Temp: 97.9 F (36.6 C)   Eyes: anicteric Cardiovascular: Cor RRR Respiratory: CTA B; normal respiratory effort Gastrointestinal: Bowel sounds are normal, liver is not enlarged, spleen is not enlarged, soft Musculoskeletal: no pedal edema noted Skin: negatives: no rash; no porphyria cutanea tarda Lymphatic: no cervical lymphadenopathy   Laboratory Genotype: No results found for: HCVGENOTYPE HCV viral load: No results found for: HCVQUANT Lab Results  Component Value Date   WBC 5.5 01/25/2017   HGB 14.3 01/25/2017   HCT 43.3 01/25/2017   MCV  95.0 01/25/2017   PLT 260 01/25/2017    Lab Results  Component Value Date   CREATININE 0.67 (L) 02/14/2017   BUN 13 02/14/2017   NA 141 02/14/2017   K 3.9 02/14/2017   CL 105 02/14/2017   CO2 24 02/14/2017    Lab Results  Component Value Date   ALT 48 (H) 02/14/2017   AST 48 (H) 02/14/2017   ALKPHOS 75 02/14/2017     Labs and history reviewed and show CHILD-PUGH A  5-6 points: Child class A 7-9 points: Child class B 10-15  points: Child class C  Lab Results  Component Value Date   INR 1.00 01/25/2017   BILITOT 0.7 02/14/2017   ALBUMIN 4.0 02/14/2017     Assessment: New Patient with Chronic Hepatitis C genotype unknown, untreated.  I discussed with the patient the lab findings that confirm chronic hepatitis C as well as the natural history and progression of disease including about 30% of people who develop cirrhosis of the liver if left untreated and once cirrhosis is established there is a 2-7% risk per year of liver cancer and liver failure.  I discussed the importance of treatment and benefits in reducing the risk, even if significant liver fibrosis exists.   Plan: 1) Patient counseled extensively on limiting acetaminophen to no more than 2 grams daily, avoidance of alcohol. 2) Transmission discussed with patient including sexual transmission, sharing razors and toothbrush.   3) Will need referral to gastroenterology if concern for cirrhosis 4) Will need referral for substance abuse counseling: No.; Further work up to include urine drug screen  No. 5) Will prescribe appropriate medication based on genotype and coverage (medicare/medicaid) 6) Hepatitis A and B titers 7) Pneumovax vaccine today 9) Further work up to include liver staging with elastography 10) will follow up after starting medication

## 2017-03-11 NOTE — Addendum Note (Signed)
Addended by: Myrtis Hopping A on: 03/11/2017 09:46 AM   Modules accepted: Orders

## 2017-03-11 NOTE — Patient Instructions (Signed)
Date 03/11/17  Dear Roy Martinez, As discussed in the [facility], your hepatitis C therapy will include highly effective medication(s) for treatment and will vary based on the type of hepatitis C and insurance approval.  Potential medications include:          Harvoni (sofosbuvir 90mg /ledipasvir 400mg ) tablet oral daily          OR     Epclusa (sofosbuvir 400mg /velpatasvir 100mg ) tablet oral daily          OR      Mavyret (glecaprevir 100 mg/pibrentasvir 40 mg): Take 3 tablets oral daily                        Medications are typically for 8 or 12 weeks total ---------------------------------------------------------------- Your HCV Treatment Start Date: You will be notified by our office once the medication is approved and where you can pick it up (or if mailed)   ---------------------------------------------------------------- [name]:   [facility], [place] [address] Phone: [phone] Hours: Monday to Friday 7:30 am to 6:00 pm   Please always contact your pharmacy at least 3-4 business days before you run out of medications to ensure your next month's medication is ready or 1 week prior to running out if you receive it by mail.  Remember, each prescription is for 28 days. ---------------------------------------------------------------- GENERAL NOTES REGARDING YOUR HEPATITIS C MEDICATION:  Some medications have the following interactions:  - Acid reducing agents such as H2 blockers (ie. Pepcid (famotidine), Zantac (ranitidine), Tagamet (cimetidine), Axid (nizatidine) and proton pump inhibitors (ie. Prilosec (omeprazole), Protonix (pantoprazole), Nexium (esomeprazole), or Aciphex (rabeprazole)). Do not take until you have discussed with a health care provider.    -Antacids that contain magnesium and/or aluminum hydroxide (ie. Milk of Magensia, Rolaids, Gaviscon, Maalox, Mylanta, an dArthritis Pain Formula).  -Calcium carbonate  (calcium supplements or antacids such as Tums, Caltrate, Os-Cal).  -St. John's wort or any products that contain St. John's wort like some herbal supplements  Please inform the office prior to starting any of these medications.  - The common side effects associated with Harvoni include:      1. Fatigue      2. Headache      3. Nausea      4. Diarrhea      5. Insomnia  Please note that this only lists the most common side effects and is NOT a comprehensive list of the potential side effects of these medications. For more information, please review the drug information sheets that come with your medication package from the pharmacy.  ---------------------------------------------------------------- GENERAL HELPFUL HINTS ON HCV THERAPY: 1. Stay well-hydrated. 2. Notify the [facility] of any changes in your other over-the-counter/herbal or prescription medications. 3. If you miss a dose of your medication, take the missed dose as soon as you remember. Return to your regular time/dose schedule the next day.  4.  Do not stop taking your medications without first talking with your healthcare provider. 5.  You may take Tylenol (acetaminophen), as long as the dose is less than 2000 mg (OR no more than 4 tablets of the Tylenol Extra Strengths 500mg  tablet) in 24 hours. 6.  You will see our pharmacist-specialist within the first 2 weeks of starting your medication to monitor for any possible side effects. 7.  You will have labs once during treatment, soon after treatment completion and one final lab 6 months after treatment completion to verify  the virus is out of your system.  Scharlene Gloss, [facility] [place] [address], [place]  [phone]

## 2017-03-12 LAB — PROTIME-INR
INR: 1
Prothrombin Time: 10.6 s (ref 9.0–11.5)

## 2017-03-12 LAB — HEPATITIS B SURFACE ANTIBODY,QUALITATIVE: HEP B S AB: NEGATIVE

## 2017-03-12 LAB — HEPATITIS A ANTIBODY, TOTAL: HEP A TOTAL AB: REACTIVE — AB

## 2017-03-12 LAB — HEPATITIS B CORE ANTIBODY, TOTAL: HEP B C TOTAL AB: REACTIVE — AB

## 2017-03-12 LAB — HEPATITIS B SURFACE ANTIGEN: HEP B S AG: NEGATIVE

## 2017-03-14 LAB — LIVER FIBROSIS, FIBROTEST-ACTITEST
ALPHA-2-MACROGLOBULIN: 239 mg/dL (ref 106–279)
ALT: 33 U/L (ref 9–46)
Apolipoprotein A1: 221 mg/dL — ABNORMAL HIGH (ref 94–176)
Bilirubin: 0.9 mg/dL (ref 0.2–1.2)
FIBROSIS SCORE: 0.42
GGT: 323 U/L — AB (ref 3–70)
Haptoglobin: 218 mg/dL — ABNORMAL HIGH (ref 43–212)
NECROINFLAMMAT ACT SCORE: 0.2
REFERENCE ID: 1997499

## 2017-03-14 LAB — HEPATITIS C GENOTYPE

## 2017-03-15 ENCOUNTER — Other Ambulatory Visit: Payer: Self-pay | Admitting: Internal Medicine

## 2017-03-15 MED ORDER — LEDIPASVIR-SOFOSBUVIR 90-400 MG PO TABS: 1.0000 | ORAL_TABLET | Freq: Every day | ORAL | 2 refills | Status: DC

## 2017-03-19 ENCOUNTER — Encounter (INDEPENDENT_AMBULATORY_CARE_PROVIDER_SITE_OTHER): Payer: Self-pay | Admitting: Physician Assistant

## 2017-03-19 ENCOUNTER — Ambulatory Visit (INDEPENDENT_AMBULATORY_CARE_PROVIDER_SITE_OTHER): Payer: Medicare Other | Admitting: Physician Assistant

## 2017-03-19 VITALS — BP 123/83 | HR 52 | Temp 97.9°F | Ht 72.0 in | Wt 184.4 lb

## 2017-03-19 DIAGNOSIS — Z1211 Encounter for screening for malignant neoplasm of colon: Secondary | ICD-10-CM | POA: Diagnosis not present

## 2017-03-19 DIAGNOSIS — Z125 Encounter for screening for malignant neoplasm of prostate: Secondary | ICD-10-CM

## 2017-03-19 DIAGNOSIS — I499 Cardiac arrhythmia, unspecified: Secondary | ICD-10-CM

## 2017-03-19 DIAGNOSIS — Z0000 Encounter for general adult medical examination without abnormal findings: Secondary | ICD-10-CM | POA: Diagnosis not present

## 2017-03-19 NOTE — Progress Notes (Signed)
Subjective:  Patient ID: Roy Martinez, male    DOB: 1955-11-07  Age: 61 y.o. MRN: [id number]  CC: annual exam  HPI Roy Martinez is a 61 y.o. male with a PMH of Hep C presents to have annual physical done. Currently undergoing treatment with [facility] for Hep C. CMP unremarkable 8 days ago when tested at [facility]. He was also found to have positive Hep A and Hep B antibodies. Does not endorse any symptoms or complaints.    Outpatient Medications Prior to Visit  Medication Sig Dispense Refill  . Ledipasvir-Sofosbuvir (HARVONI) 90-400 MG TABS Take 1 tablet by mouth daily. 28 tablet 2   No facility-administered medications prior to visit.      ROS Review of Systems  Constitutional: Negative for chills, fever and malaise/fatigue.  Eyes: Negative for blurred vision.  Respiratory: Negative for shortness of breath.   Cardiovascular: Negative for chest pain and palpitations.  Gastrointestinal: Negative for abdominal pain and nausea.  Genitourinary: Negative for dysuria and hematuria.  Musculoskeletal: Negative for joint pain and myalgias.  Skin: Negative for rash.  Neurological: Negative for tingling and headaches.  Psychiatric/Behavioral: Negative for depression. The patient is not nervous/anxious.     Objective:  BP 123/83 (BP Location: Left Arm, Patient Position: Sitting, Cuff Size: Normal)   Pulse (!) 52   Temp 97.9 F (36.6 C) (Oral)   Ht 6' (1.829 m)   Wt 184 lb 6.4 oz (83.6 kg)   SpO2 98%   BMI 25.01 kg/m   BP/Weight 03/19/2017 03/11/2017 01/09/4080  Systolic BP 448 - 185  Diastolic BP 83 - 78  Wt. (Lbs) 184.4 187 185.4  BMI 25.01 24.67 24.46      Physical Exam  Constitutional: He is oriented to person, place, and time.  Well developed, central obesity, NAD, polite  HENT:  Head: Normocephalic and atraumatic.  Eyes: Conjunctivae and EOM are normal. Pupils are equal, round, and reactive to light. No scleral icterus.  Neck: Normal range of motion. Neck supple. No thyromegaly  present.  Cardiovascular: Normal rate, normal heart sounds and intact distal pulses.   No murmur heard. Regularly irregular rhythm  Pulmonary/Chest: Effort normal and breath sounds normal.  Abdominal: Soft. Bowel sounds are normal. He exhibits mass. He exhibits no distension. There is no tenderness.  Hepatomegaly  Genitourinary: Penis normal.  Genitourinary Comments: Testicles mildly atrophied, no nodules. Prostate approximately 30 g, non tender, normal consistency, no nodules.  Musculoskeletal: He exhibits no edema.  Lymphadenopathy:    He has no cervical adenopathy.  Neurological: He is alert and oriented to person, place, and time. No cranial nerve deficit. Coordination normal.  Strength 5/5 throughout  Skin: Skin is warm and dry. No rash noted. No erythema. No pallor.  Psychiatric: He has a normal mood and affect. His behavior is normal. Thought content normal.  Vitals reviewed.    Assessment & Plan:   1. Annual physical exam - I have order CBC, PSA, and Lipids and explained Medicare options on waiver form to patient. He will be calling medicare to ask why he needs to fill out the waiver form and possibly have to pay out of pocket.  2. Screening for prostate cancer - Ordered PSA. See above #1  3. Screening for malignant neoplasm of colon - Ambulatory referral to Gastroenterology  4. Irregular heartbeat - EKG, sent to [facility] due to nonfunctioning EKG machine here in clinic. - Ambulatory referral to cardiology.   Follow-up: Return if symptoms worsen or fail to improve.   Roger  Roderic Ovens PA

## 2017-03-19 NOTE — Patient Instructions (Signed)
Colonoscopy, Adult  A colonoscopy is an exam to look at the large intestine. It is done to check for problems, such as:  · Lumps (tumors).  · Growths (polyps).  · Swelling (inflammation).  · Bleeding.    What happens before the procedure?  Eating and drinking   Follow instructions from your doctor about eating and drinking. These instructions may include:  · A few days before the procedure - follow a low-fiber diet.  ? Avoid nuts.  ? Avoid seeds.  ? Avoid dried fruit.  ? Avoid raw fruits.  ? Avoid vegetables.  · 1-3 days before the procedure - follow a clear liquid diet. Avoid liquids that have red or purple dye. Drink only clear liquids, such as:  ? Clear broth or bouillon.  ? Black coffee or tea.  ? Clear juice.  ? Clear soft drinks or sports drinks.  ? Gelatin dessert.  ? Popsicles.  · On the day of the procedure - do not eat or drink anything during the 2 hours before the procedure.    Bowel prep   If you were prescribed an oral bowel prep:  · Take it as told by your doctor. Starting the day before your procedure, you will need to drink a lot of liquid. The liquid will cause you to poop (have bowel movements) until your poop is almost clear or light green.  · If your skin or butt gets irritated from diarrhea, you may:  ? Wipe the area with wipes that have medicine in them, such as adult wet wipes with aloe and vitamin E.  ? Put something on your skin that soothes the area, such as petroleum jelly.  · If you throw up (vomit) while drinking the bowel prep, take a break for up to 60 minutes. Then begin the bowel prep again. If you keep throwing up and you cannot take the bowel prep without throwing up, call your doctor.    General instructions   · Ask your doctor about changing or stopping your normal medicines. This is important if you take diabetes medicines or blood thinners.  · Plan to have someone take you home from the hospital or clinic.  What happens during the procedure?  · An IV tube may be put into one  of your veins.  · You will be given medicine to help you relax (sedative).  · To reduce your risk of infection:  ? Your doctors will wash their hands.  ? Your anal area will be washed with soap.  · You will be asked to lie on your side with your knees bent.  · Your doctor will get a long, thin, flexible tube ready. The tube will have a camera and a light on the end.  · The tube will be put into your anus.  · The tube will be gently put into your large intestine.  · Air will be delivered into your large intestine to keep it open. You may feel some pressure or cramping.  · The camera will be used to take photos.  · A small tissue sample may be removed from your body to be looked at under a microscope (biopsy). If any possible problems are found, the tissue will be sent to a lab for testing.  · If small growths are found, your doctor may remove them and have them checked for cancer.  · The tube that was put into your anus will be slowly removed.  The procedure may vary among doctors   and hospitals.  What happens after the procedure?  · Your doctor will check on you often until the medicines you were given have worn off.  · Do not drive for 24 hours after the procedure.  · You may have a small amount of blood in your poop.  · You may pass gas.  · You may have mild cramps or bloating in your belly (abdomen).  · It is up to you to get the results of your procedure. Ask your doctor, or the department performing the procedure, when your results will be ready.  This information is not intended to replace advice given to you by your health care provider. Make sure you discuss any questions you have with your health care provider.  Document Released: 10/13/2010 Document Revised: 07/11/2016 Document Reviewed: 11/22/2015  Elsevier Interactive Patient Education © 2017 [employer] Inc[employer]

## 2017-03-20 ENCOUNTER — Ambulatory Visit (HOSPITAL_COMMUNITY)
Admission: RE | Admit: 2017-03-20 | Discharge: 2017-03-20 | Disposition: A | Discharge status: Home / Self Care | Payer: Medicare Other | Source: Ambulatory Visit | Attending: Internal Medicine | Admitting: Internal Medicine

## 2017-03-20 DIAGNOSIS — N281 Cyst of kidney, acquired: Secondary | ICD-10-CM | POA: Diagnosis not present

## 2017-03-20 DIAGNOSIS — K7689 Other specified diseases of liver: Secondary | ICD-10-CM | POA: Insufficient documentation

## 2017-03-20 DIAGNOSIS — B182 Chronic viral hepatitis C: Secondary | ICD-10-CM | POA: Insufficient documentation

## 2017-03-28 ENCOUNTER — Encounter: Payer: Self-pay | Admitting: Cardiology

## 2017-03-28 ENCOUNTER — Ambulatory Visit (INDEPENDENT_AMBULATORY_CARE_PROVIDER_SITE_OTHER): Payer: Medicare Other | Admitting: Cardiology

## 2017-03-28 VITALS — BP 122/72 | HR 48 | Ht 72.0 in | Wt 184.8 lb

## 2017-03-28 DIAGNOSIS — B182 Chronic viral hepatitis C: Secondary | ICD-10-CM | POA: Diagnosis not present

## 2017-03-28 DIAGNOSIS — F17200 Nicotine dependence, unspecified, uncomplicated: Secondary | ICD-10-CM | POA: Diagnosis not present

## 2017-03-28 DIAGNOSIS — R9431 Abnormal electrocardiogram [ECG] [EKG]: Secondary | ICD-10-CM

## 2017-03-28 DIAGNOSIS — I499 Cardiac arrhythmia, unspecified: Secondary | ICD-10-CM | POA: Diagnosis not present

## 2017-03-28 DIAGNOSIS — F1010 Alcohol abuse, uncomplicated: Secondary | ICD-10-CM | POA: Diagnosis not present

## 2017-03-28 NOTE — Assessment & Plan Note (Signed)
1/2 PPD

## 2017-03-28 NOTE — Assessment & Plan Note (Signed)
Currently under treatment

## 2017-03-28 NOTE — Progress Notes (Signed)
03/28/2017 Roy Martinez   1956/09/05  [id number]  Primary Physician Clent Demark, PA-C Primary Cardiologist: Dr Debara Pickett (new)  HPI:  61 y/o AA male with a history of alcoholism and prior crack cocaine addiction, sent to cardiology for evaluation of an irregular heart beat on exam by his PCP 03/19/17. The pt denies any history of palpitations or prior cardiac problems. He denies chest pain or unusual dyspnea. He has a depressed affect. He lives alone. His son encouraged him to seek treatment for his Hepatitis C. He is currently being treated for Hepatitis C.    No current outpatient prescriptions on file.   No current facility-administered medications for this visit.     Allergies  Allergen Reactions  . Penicillins Hives    Childhood allergy Has patient had a PCN reaction causing immediate rash, facial/tongue/throat swelling, SOB or lightheadedness with hypotension: No Has patient had a PCN reaction causing severe rash involving mucus membranes or skin necrosis: No Has patient had a PCN reaction that required hospitalization No Has patient had a PCN reaction occurring within the last 10 years: No If all of the above answers are "NO", then may proceed with Cephalosporin use.     Past Medical History:  Diagnosis Date  . Chronic hepatitis C virus infection ([place])    under treatment June 2016  . H/O ETOH abuse   . Smoker    1/2 ppd    Social History   Social History  . Marital status: Single    Spouse name: N/A  . Number of children: N/A  . Years of education: N/A   Occupational History  . Not on file.   Social History Main Topics  . Smoking status: Current Every Day Smoker    Packs/day: 0.50    Types: Cigarettes  . Smokeless tobacco: Never Used     Comment: cutting back  . Alcohol use 3.0 - 3.6 oz/week    5 - 6 Cans of beer per week     Comment: pt states daily beer drinker  . Drug use: No  . Sexual activity: No   Other Topics Concern  . Not on file    Social History Narrative   ** Merged History Encounter **         Family History  Problem Relation Age of Onset  . Heart failure Mother   . Heart disease Mother   . Heart failure Father   . Arrhythmia Brother      Review of Systems: General: negative for chills, fever, night sweats or weight changes.  Cardiovascular: negative for chest pain, dyspnea on exertion, edema, orthopnea, palpitations, paroxysmal nocturnal dyspnea or shortness of breath Dermatological: negative for rash Respiratory: negative for cough or wheezing Urologic: negative for hematuria Abdominal: negative for nausea, vomiting, diarrhea, bright red blood per rectum, melena, or hematemesis Neurologic: negative for visual changes, syncope, or dizziness All other systems reviewed and are otherwise negative except as noted above.    Blood pressure 122/72, pulse (!) 48, height 6' (1.829 m), weight 184 lb 12.8 oz (83.8 kg), SpO2 97 %.  General appearance: alert, cooperative and no distress Neck: no carotid bruit and no JVD Lungs: clear to auscultation bilaterally Heart: regular rate and rhythm Abdomen: soft, non-tender; bowel sounds normal; no masses,  no organomegaly and umbilical hernia Extremities: extremities normal, atraumatic, no cyanosis or edema Pulses: 2+ and symmetric Skin: Skin color, texture, turgor normal. No rashes or lesions Neurologic: Grossly normal, depressed, slow affect  EKG NSR,  intermittent fusion complexes and variable PR, NSST changes presumable secondary to LVH with repol  ASSESSMENT AND PLAN:   Irregular heart beat Pt referred to [place] for irregular heart beats on exam per his PCP-pt is asymptomatic  Abnormal EKG EKG show NSR, SB, fusion beats  Chronic hepatitis C without hepatic coma (HCC) Currently under treatment   Smoker 1/2 PPD  ETOH abuse "a few beers" a day   PLAN  Will check a 48 hour monitor, an echo, and a TSH.  F/U with Dr Debara Pickett after this.   Kerin Ransom  PA-C 03/28/2017 2:25 PM

## 2017-03-28 NOTE — Assessment & Plan Note (Signed)
EKG show NSR, SB, fusion beats

## 2017-03-28 NOTE — Patient Instructions (Addendum)
Medication Instructions: No changes  Labwork: Please have the following lab drawn today: TSH  Procedures/Testing: Your physician has requested that you have an echocardiogram. Echocardiography is a painless test that uses sound waves to create images of your heart. It provides your doctor with information about the size and shape of your heart and how well your heart's chambers and valves are working. This procedure takes approximately one hour. There are no restrictions for this procedure. This will be done at [address], [address].  Your physician has recommended that you wear a 48 holter monitor. Holter monitors are medical devices that record the heart's electrical activity. Doctors most often use these monitors to diagnose arrhythmias. Arrhythmias are problems with the speed or rhythm of the heartbeat. The monitor is a small, portable device. You can wear one while you do your normal daily activities. This is usually used to diagnose what is causing palpitations/syncope (passing out). This will be done at [address].   Follow-Up: Your physician recommends that you schedule a follow-up appointment after the echo and 48 hour monitor has been done   If you need a refill on your cardiac medications before your next appointment, please call your pharmacy.

## 2017-03-28 NOTE — Assessment & Plan Note (Signed)
Pt referred to [place] for irregular heart beats on exam per his PCP-pt is asymptomatic

## 2017-03-28 NOTE — Assessment & Plan Note (Signed)
"  a few beers" a day

## 2017-03-29 LAB — TSH: TSH: 0.485 u[IU]/mL (ref 0.450–4.500)

## 2017-04-02 ENCOUNTER — Telehealth: Payer: Self-pay | Admitting: *Deleted

## 2017-04-02 ENCOUNTER — Other Ambulatory Visit: Payer: Self-pay | Admitting: Pharmacist

## 2017-04-02 DIAGNOSIS — B182 Chronic viral hepatitis C: Secondary | ICD-10-CM

## 2017-04-02 MED ORDER — LEDIPASVIR-SOFOSBUVIR 90-400 MG PO TABS: 1.0000 | ORAL_TABLET | Freq: Every day | ORAL | 2 refills | Status: DC

## 2017-04-02 NOTE — Telephone Encounter (Signed)
Call from [facility] stating that they need more information on his prior authorization for hep C medication. Reference # [id number]. They said the application expires this evening. Call back # [phone]. Myrtis Hopping CMA

## 2017-04-11 ENCOUNTER — Encounter (INDEPENDENT_AMBULATORY_CARE_PROVIDER_SITE_OTHER): Payer: Medicare Other

## 2017-04-11 ENCOUNTER — Ambulatory Visit (HOSPITAL_COMMUNITY): Payer: Medicare Other | Attending: Cardiology

## 2017-04-11 ENCOUNTER — Other Ambulatory Visit: Payer: Self-pay

## 2017-04-11 DIAGNOSIS — R9431 Abnormal electrocardiogram [ECG] [EKG]: Secondary | ICD-10-CM | POA: Diagnosis not present

## 2017-04-11 DIAGNOSIS — I499 Cardiac arrhythmia, unspecified: Secondary | ICD-10-CM | POA: Diagnosis not present

## 2017-04-11 DIAGNOSIS — I340 Nonrheumatic mitral (valve) insufficiency: Secondary | ICD-10-CM | POA: Diagnosis not present

## 2017-04-11 MED FILL — HARVONI 90-400 MG TABLET: 90-400 | 28 days supply | Qty: 28 | Fill #0

## 2017-04-12 ENCOUNTER — Encounter: Payer: Self-pay | Admitting: Pharmacy Technician

## 2017-04-12 ENCOUNTER — Telehealth: Payer: Self-pay | Admitting: *Deleted

## 2017-04-12 NOTE — Telephone Encounter (Signed)
-----   Message from Consuelo Pandy, [place] sent at 04/12/2017  4:00 PM EDT ----- Cardiac ultrasound looks ok. Pump function of heart is normal. Mitral valve is a bit leaky. This is mild. Nothing to worry about.

## 2017-04-12 NOTE — Telephone Encounter (Signed)
Left message for pt to call back re: Echo results

## 2017-04-22 ENCOUNTER — Ambulatory Visit: Payer: Medicare Other | Admitting: Cardiology

## 2017-04-22 ENCOUNTER — Encounter: Payer: Self-pay | Admitting: *Deleted

## 2017-04-22 NOTE — Telephone Encounter (Signed)
Per Della Goo, CMA, pt aware of results. See result note.

## 2017-05-02 ENCOUNTER — Ambulatory Visit (INDEPENDENT_AMBULATORY_CARE_PROVIDER_SITE_OTHER): Payer: Medicare Other | Admitting: Pharmacist

## 2017-05-02 DIAGNOSIS — B182 Chronic viral hepatitis C: Secondary | ICD-10-CM | POA: Diagnosis not present

## 2017-05-02 MED FILL — HARVONI 90-400 MG TABLET: 90-400 | 28 days supply | Qty: 28 | Fill #1

## 2017-05-02 NOTE — Progress Notes (Signed)
HPI: Roy Martinez is a 61 y.o. male who presents to the [facility] clinic for follow-up of his Hep C infection. He has genotype 1a, F1 fibrosis, and started 12 weeks of Harvoni on 7/20.   Lab Results  Component Value Date   HCVGENOTYPE 1a 03/11/2017    Allergies: Allergies  Allergen Reactions  . Penicillins Hives    Childhood allergy Has patient had a PCN reaction causing immediate rash, facial/tongue/throat swelling, SOB or lightheadedness with hypotension: No Has patient had a PCN reaction causing severe rash involving mucus membranes or skin necrosis: No Has patient had a PCN reaction that required hospitalization No Has patient had a PCN reaction occurring within the last 10 years: No If all of the above answers are "NO", then may proceed with Cephalosporin use.     Past Medical History: Past Medical History:  Diagnosis Date  . Chronic hepatitis C virus infection ([place])    under treatment June 2016  . H/O ETOH abuse   . Smoker    1/2 ppd    Social History: Social History   Social History  . Marital status: Single    Spouse name: N/A  . Number of children: N/A  . Years of education: N/A   Social History Main Topics  . Smoking status: Current Every Day Smoker    Packs/day: 0.50    Types: Cigarettes  . Smokeless tobacco: Never Used     Comment: cutting back  . Alcohol use 3.0 - 3.6 oz/week    5 - 6 Cans of beer per week     Comment: pt states daily beer drinker  . Drug use: No  . Sexual activity: No   Other Topics Concern  . Not on file   Social History Narrative   ** Merged History Encounter **        Labs: Hep B S Ab (no units)  Date Value  03/11/2017 NEG   Hepatitis B Surface Ag (no units)  Date Value  03/11/2017 NEGATIVE   HCV Ab (s/co ratio)  Date Value  01/25/2017 >11.0 (H)    Lab Results  Component Value Date   HCVGENOTYPE 1a 03/11/2017    No flowsheet data found.  AST  Date Value  03/11/2017 35 U/L  02/14/2017 48 IU/L (H)   01/25/2017 47 U/L (H)   ALT  Date Value  03/11/2017 32 U/L  03/11/2017 33 U/L  02/14/2017 48 IU/L (H)  01/25/2017 41 U/L   INR (no units)  Date Value  03/11/2017 1.0  01/25/2017 1.00    CrCl: CrCl cannot be calculated (Patient's most recent lab result is older than the maximum 21 days allowed.).  Fibrosis Score: F1 as assessed by fibrosure   Child-Pugh Score: A  Previous Treatment Regimen: None  Assessment: [name] is here today to follow-up for his Hep C infection. He started Harvoni around 3 weeks ago and is tolerating it well. He isn't having any side effects associated with the medication but is a tad bit more tired than usual.  He takes it every day and never misses a dose. He takes it every morning when he wakes up.  I will get a Hep C viral load today and make his end of therapy appointment with [place].    Plans: - Continue Harvoni x 12 weeks - Hep C RNA today - F/u with [place] again 10/24 at 2:30pm  Keron Neenan L. Siddiq Kaluzny, PharmD, [facility] 05/02/2017, 4:04 PM

## 2017-05-07 LAB — HEPATITIS C RNA QUANTITATIVE
HCV QUANT LOG: NOT DETECTED {Log_IU}/mL
HCV QUANT: NOT DETECTED [IU]/mL

## 2017-05-09 ENCOUNTER — Encounter: Payer: Self-pay | Admitting: Gastroenterology

## 2017-05-13 ENCOUNTER — Ambulatory Visit (INDEPENDENT_AMBULATORY_CARE_PROVIDER_SITE_OTHER): Payer: Medicare Other | Admitting: Internal Medicine

## 2017-05-13 ENCOUNTER — Encounter: Payer: Self-pay | Admitting: Internal Medicine

## 2017-05-13 VITALS — BP 134/72 | HR 60 | Ht 72.0 in | Wt 179.0 lb

## 2017-05-13 DIAGNOSIS — I499 Cardiac arrhythmia, unspecified: Secondary | ICD-10-CM

## 2017-05-13 DIAGNOSIS — R9431 Abnormal electrocardiogram [ECG] [EKG]: Secondary | ICD-10-CM | POA: Diagnosis not present

## 2017-05-13 NOTE — Patient Instructions (Signed)
Your physician recommends that you schedule a follow-up appointment as needed  

## 2017-05-13 NOTE — Progress Notes (Signed)
OFFICE FOLLOW-UP NOTE  Chief Complaint:  Follow-up echo and monitor  Primary Care Physician: Clent Demark, PA-C  HPI:  Roy Martinez is a 61 y.o. male with a past medial history significant for hepatitis C who is currently on Harvoni. He also has a history of alcohol and substance abuse as well as smoking in the past. He recently saw Roy Ransom, PA-C, for evaluation of irregular heartbeat. He was noted to have an abnormal EKG showing sinus bradycardia with fusion beats. He underwent a 48 hour monitor which showed PACs, PVCs and a short run of SVT less than 8 beats. No non-sustained ventricular tachycardia, A. fib or other significant abnormalities noted. He reportedly was and remains asymptomatic. He also had an echocardiogram which showed normal LV function and mild mitral regurgitation. He denies any chest pain or worsening shortness of breath with exertion.   PMHx:  Past Medical History:  Diagnosis Date  . Chronic hepatitis C virus infection ([place])    under treatment June 2016  . H/O ETOH abuse   . Smoker    1/2 ppd    Past Surgical History:  Procedure Laterality Date  . KNEE ARTHROSCOPY    . KNEE ARTHROSCOPY WITH MENISCAL REPAIR Right 06/26/2016   Procedure: KNEE ARTHROSCOPY WITH MENISCAL REPAIR VS RESECTION;  Surgeon: Meredith Pel, MD;  Location: [facility];  Service: Orthopedics;  Laterality: Right;  . PATELLAR TENDON REPAIR Right 06/26/2016   Procedure: PATELLA TENDON REPAIR;  Surgeon: Meredith Pel, MD;  Location: [facility];  Service: Orthopedics;  Laterality: Right;    FAMHx:  Family History  Problem Relation Age of Onset  . Heart failure Mother   . Heart disease Mother   . Heart failure Father   . Arrhythmia Brother     SOCHx:   reports that he has been smoking Cigarettes.  He has been smoking about 0.50 packs per day. He has never used smokeless tobacco. He reports that he drinks about 3.0 - 3.6 oz of alcohol per week . He reports that he does not use  drugs.  ALLERGIES:  Allergies  Allergen Reactions  . Penicillins Hives    Childhood allergy Has patient had a PCN reaction causing immediate rash, facial/tongue/throat swelling, SOB or lightheadedness with hypotension: No Has patient had a PCN reaction causing severe rash involving mucus membranes or skin necrosis: No Has patient had a PCN reaction that required hospitalization No Has patient had a PCN reaction occurring within the last 10 years: No If all of the above answers are "NO", then may proceed with Cephalosporin use.     ROS: Pertinent items noted in HPI and remainder of comprehensive ROS otherwise negative.  HOME MEDS: Current Outpatient Prescriptions on File Prior to Visit  Medication Sig Dispense Refill  . Ledipasvir-Sofosbuvir (HARVONI) 90-400 MG TABS Take 1 tablet by mouth daily. 28 tablet 2   No current facility-administered medications on file prior to visit.     LABS/IMAGING: No results found for this or any previous visit (from the past 48 hour(s)). No results found.  LIPID PANEL:    Component Value Date/Time   CHOL 172 02/14/2017 1639   TRIG 82 02/14/2017 1639   HDL 90 02/14/2017 1639   CHOLHDL 1.9 02/14/2017 1639   CHOLHDL 2.0 Ratio 11/20/2010 2100   VLDL 23 11/20/2010 2100   LDLCALC 66 02/14/2017 1639     WEIGHTS: Wt Readings from Last 3 Encounters:  05/13/17 179 lb (81.2 kg)  03/28/17 184 lb 12.8 oz (  83.8 kg)  03/19/17 184 lb 6.4 oz (83.6 kg)    VITALS: BP 134/72   Pulse 60   Ht 6' (1.829 m)   Wt 179 lb (81.2 kg)   BMI 24.28 kg/m   EXAM: General appearance: alert and no distress Neck: no carotid bruit, no JVD and thyroid not enlarged, symmetric, no tenderness/mass/nodules Lungs: clear to auscultation bilaterally Heart: regular rate and rhythm, S1, S2 normal, no murmur, click, rub or gallop Abdomen: soft, non-tender; bowel sounds normal; no masses,  no organomegaly Extremities: extremities normal, atraumatic, no cyanosis or  edema Pulses: 2+ and symmetric Skin: Skin color, texture, turgor normal. No rashes or lesions Neurologic: Grossly normal Psych: Pleasant  EKG: Deferred  ASSESSMENT: 1. Irregular heartbeat - PACs and PVCs, asymptomatic 2. Normal LV function with mild MR on echo (03/2017)  PLAN: 1.   [name] is asymptomatic PVCs and PVCs and had a short run of SVT, again asymptomatic. Echo shows normal LV function with only mild MR. There is no indication for further cardiac workup at this time. Follow-up as needed.  Pixie Casino, MD, [facility]  Attending Cardiologist  Direct Dial: [phone]  Fax: [phone]  Website:  [email or web address] 05/13/2017, 3:10 PM

## 2017-05-30 ENCOUNTER — Telehealth: Payer: Self-pay | Admitting: *Deleted

## 2017-05-30 NOTE — Telephone Encounter (Signed)
Patient no show pv today. Called cell #, no answer, unable to leave message, no voicemail. Then I called home # listed, wrong number per person answering the phone. No show letter will be mailed today.

## 2017-06-03 ENCOUNTER — Ambulatory Visit (AMBULATORY_SURGERY_CENTER): Payer: Self-pay

## 2017-06-03 VITALS — Ht 73.0 in | Wt 185.8 lb

## 2017-06-03 DIAGNOSIS — Z1211 Encounter for screening for malignant neoplasm of colon: Secondary | ICD-10-CM

## 2017-06-03 MED ORDER — NA SULFATE-K SULFATE-MG SULF 17.5-3.13-1.6 GM/177ML PO SOLN: 1.0000 | Freq: Once | ORAL | 0 refills | Status: AC

## 2017-06-03 MED FILL — HARVONI 90-400 MG TABLET: 90-400 | 28 days supply | Qty: 28 | Fill #2

## 2017-06-03 NOTE — Progress Notes (Signed)
Denies allergies to eggs or soy products. Denies complication of anesthesia or sedation. Denies use of weight loss medication. Denies use of O2.   Emmi instructions declined. Does not have email.

## 2017-06-11 ENCOUNTER — Encounter: Payer: Self-pay | Admitting: Gastroenterology

## 2017-06-11 ENCOUNTER — Ambulatory Visit (AMBULATORY_SURGERY_CENTER): Payer: Medicare Other | Admitting: Gastroenterology

## 2017-06-11 VITALS — BP 110/72 | HR 55 | Temp 98.6°F | Resp 16 | Ht 73.0 in | Wt 185.0 lb

## 2017-06-11 DIAGNOSIS — Z1211 Encounter for screening for malignant neoplasm of colon: Secondary | ICD-10-CM | POA: Diagnosis not present

## 2017-06-11 DIAGNOSIS — K621 Rectal polyp: Secondary | ICD-10-CM

## 2017-06-11 DIAGNOSIS — D122 Benign neoplasm of ascending colon: Secondary | ICD-10-CM | POA: Diagnosis not present

## 2017-06-11 DIAGNOSIS — D128 Benign neoplasm of rectum: Secondary | ICD-10-CM

## 2017-06-11 DIAGNOSIS — Z1212 Encounter for screening for malignant neoplasm of rectum: Secondary | ICD-10-CM

## 2017-06-11 MED ORDER — SODIUM CHLORIDE 0.9 % IV SOLN: 500.0000 mL | INTRAVENOUS | Status: DC

## 2017-06-11 NOTE — Patient Instructions (Signed)
YOU HAD AN ENDOSCOPIC PROCEDURE TODAY AT [facility]:   Refer to the procedure report that was given to you for any specific questions about what was found during the examination.  If the procedure report does not answer your questions, please call your gastroenterologist to clarify.  If you requested that your care partner not be given the details of your procedure findings, then the procedure report has been included in a sealed envelope for you to review at your convenience later.  YOU SHOULD EXPECT: Some feelings of bloating in the abdomen. Passage of more gas than usual.  Walking can help get rid of the air that was put into your GI tract during the procedure and reduce the bloating. If you had a lower endoscopy (such as a colonoscopy or flexible sigmoidoscopy) you may notice spotting of blood in your stool or on the toilet paper. If you underwent a bowel prep for your procedure, you may not have a normal bowel movement for a few days.  Please Note:  You might notice some irritation and congestion in your nose or some drainage.  This is from the oxygen used during your procedure.  There is no need for concern and it should clear up in a day or so.  SYMPTOMS TO REPORT IMMEDIATELY:   Following lower endoscopy (colonoscopy or flexible sigmoidoscopy):  Excessive amounts of blood in the stool  Significant tenderness or worsening of abdominal pains  Swelling of the abdomen that is new, acute  Fever of 100F or higher   For urgent or emergent issues, a gastroenterologist can be reached at any hour by calling [phone].  Please see polyp handout.   DIET:  We do recommend a small meal at first, but then you may proceed to your regular diet.  Drink plenty of fluids but you should avoid alcoholic beverages for 24 hours.  ACTIVITY:  You should plan to take it easy for the rest of today and you should NOT DRIVE or use heavy machinery until tomorrow (because of the sedation  medicines used during the test).    FOLLOW UP: Our staff will call the number listed on your records the next business day following your procedure to check on you and address any questions or concerns that you may have regarding the information given to you following your procedure. If we do not reach you, we will leave a message.  However, if you are feeling well and you are not experiencing any problems, there is no need to return our call.  We will assume that you have returned to your regular daily activities without incident.  If any biopsies were taken you will be contacted by phone or by letter within the next 1-3 weeks.  Please call us at [phone] if you have not heard about the biopsies in 3 weeks.    SIGNATURES/CONFIDENTIALITY: You and/or your care partner have signed paperwork which will be entered into your electronic medical record.  These signatures attest to the fact that that the information above on your After Visit Summary has been reviewed and is understood.  Full responsibility of the confidentiality of this discharge information lies with you and/or your care-partner.  Thank you for letting us take care of your healthcare needs today.

## 2017-06-11 NOTE — Progress Notes (Signed)
Spontaneous respirations throughout. VSS. Resting comfortably. To PACU on room air. Report to  RN. 

## 2017-06-11 NOTE — Op Note (Signed)
[place] Patient Name: Roy Martinez Procedure Date: 06/11/2017 2:12 PM MRN: [id number] Endoscopist: Mallie Mussel L. Loletha Carrow , MD Age: 61 Referring MD:  Date of Birth: [birth year] Gender: Male Account #: [id number] Procedure:                Colonoscopy Indications:              Screening for colorectal malignant neoplasm, This                            is the patient's first colonoscopy Medicines:                Monitored Anesthesia Care Procedure:                Pre-Anesthesia Assessment:                           - Prior to the procedure, a History and Physical                            was performed, and patient medications and                            allergies were reviewed. The patient's tolerance of                            previous anesthesia was also reviewed. The risks                            and benefits of the procedure and the sedation                            options and risks were discussed with the patient.                            All questions were answered, and informed consent                            was obtained. Prior Anticoagulants: The patient has                            taken no previous anticoagulant or antiplatelet                            agents. ASA Grade Assessment: II - A patient with                            mild systemic disease. After reviewing the risks                            and benefits, the patient was deemed in                            satisfactory condition to undergo the procedure.  After obtaining informed consent, the colonoscope                            was passed under direct vision. Throughout the                            procedure, the patient's blood pressure, pulse, and                            oxygen saturations were monitored continuously. The                            Model CF-HQ190L [id number]) scope was introduced                            through the anus and  advanced to the the cecum,                            identified by appendiceal orifice and ileocecal                            valve. The colonoscopy was performed without                            difficulty. The patient tolerated the procedure                            well. The quality of the bowel preparation was                            good. The ileocecal valve, appendiceal orifice, and                            rectum were photographed. The quality of the bowel                            preparation was evaluated using the BBPS [facility]                            Bowel Preparation Scale) with scores of: Right                            Colon = 2, Transverse Colon = 2 and Left Colon = 2.                            The total BBPS score equals 6. After lavage. The                            bowel preparation used was SUPREP. Scope In: 2:53:19 PM Scope Out: 3:11:06 PM Scope Withdrawal Time: 0 hours 15 minutes 8 seconds  Total Procedure Duration: 0 hours 17 minutes 47 seconds  Findings:  The perianal and digital rectal examinations were                            normal.                           Two sessile polyps were found in the rectum and                            ascending colon. The polyps were 4 mm in size.                            These polyps were removed with a cold snare.                            Resection and retrieval were complete.                           Internal hemorrhoids were found. The hemorrhoids                            were small and Grade I (internal hemorrhoids that                            do not prolapse).                           The exam was otherwise without abnormality on                            direct and retroflexion views. Complications:            No immediate complications. Estimated Blood Loss:     Estimated blood loss was minimal. Impression:               - Two 4 mm polyps in the rectum and in the                             ascending colon, removed with a cold snare.                            Resected and retrieved.                           - Internal hemorrhoids.                           - The examination was otherwise normal on direct                            and retroflexion views. Recommendation:           - Patient has a contact number available for                            emergencies. The signs and symptoms of potential  delayed complications were discussed with the                            patient. Return to normal activities tomorrow.                            Written discharge instructions were provided to the                            patient.                           - Resume previous diet.                           - Continue present medications.                           - Await pathology results.                           - Repeat colonoscopy is recommended for                            surveillance. The colonoscopy date will be                            determined after pathology results from today's                            exam become available for review. Teffany Blaszczyk L. Loletha Carrow, MD 06/11/2017 3:14:10 PM This report has been signed electronically.

## 2017-06-11 NOTE — Progress Notes (Signed)
Called to room to assist during endoscopic procedure.  Patient ID and intended procedure confirmed with present staff. Received instructions for my participation in the procedure from the performing physician.  

## 2017-06-12 ENCOUNTER — Telehealth: Payer: Self-pay | Admitting: *Deleted

## 2017-06-12 NOTE — Telephone Encounter (Signed)
  Follow up Call-  Call back number 06/11/2017  Post procedure Call Back phone  # [phone]  Permission to leave phone message No  Some recent data might be hidden     Patient questions:  Do you have a fever, pain , or abdominal swelling? No. Pain Score  0 *  Have you tolerated food without any problems? Yes.    Have you been able to return to your normal activities? Yes.    Do you have any questions about your discharge instructions: Diet   No. Medications  No. Follow up visit  No.  Do you have questions or concerns about your Care? No.  Actions: * If pain score is 4 or above: No action needed, pain <4.

## 2017-06-13 ENCOUNTER — Encounter: Payer: Self-pay | Admitting: Gastroenterology

## 2017-07-01 ENCOUNTER — Emergency Department (HOSPITAL_COMMUNITY): Payer: Medicare Other

## 2017-07-01 ENCOUNTER — Encounter (HOSPITAL_COMMUNITY): Payer: Self-pay | Admitting: Emergency Medicine

## 2017-07-01 DIAGNOSIS — B1710 Acute hepatitis C without hepatic coma: Secondary | ICD-10-CM | POA: Diagnosis not present

## 2017-07-01 DIAGNOSIS — R079 Chest pain, unspecified: Secondary | ICD-10-CM | POA: Diagnosis not present

## 2017-07-01 DIAGNOSIS — R0789 Other chest pain: Secondary | ICD-10-CM | POA: Diagnosis not present

## 2017-07-01 DIAGNOSIS — F17210 Nicotine dependence, cigarettes, uncomplicated: Secondary | ICD-10-CM | POA: Insufficient documentation

## 2017-07-01 DIAGNOSIS — F1010 Alcohol abuse, uncomplicated: Secondary | ICD-10-CM | POA: Insufficient documentation

## 2017-07-01 DIAGNOSIS — Z79899 Other long term (current) drug therapy: Secondary | ICD-10-CM | POA: Insufficient documentation

## 2017-07-01 DIAGNOSIS — M25511 Pain in right shoulder: Secondary | ICD-10-CM | POA: Diagnosis not present

## 2017-07-01 LAB — CBC
HCT: 39 % (ref 39.0–52.0)
HEMOGLOBIN: 12.7 g/dL — AB (ref 13.0–17.0)
MCH: 30.9 pg (ref 26.0–34.0)
MCHC: 32.6 g/dL (ref 30.0–36.0)
MCV: 94.9 fL (ref 78.0–100.0)
Platelets: 292 10*3/uL (ref 150–400)
RBC: 4.11 MIL/uL — AB (ref 4.22–5.81)
RDW: 13 % (ref 11.5–15.5)
WBC: 6.5 10*3/uL (ref 4.0–10.5)

## 2017-07-01 LAB — I-STAT TROPONIN, ED: TROPONIN I, POC: 0.01 ng/mL (ref 0.00–0.08)

## 2017-07-01 LAB — BASIC METABOLIC PANEL
ANION GAP: 11 (ref 5–15)
BUN: 17 mg/dL (ref 6–20)
CALCIUM: 9 mg/dL (ref 8.9–10.3)
CO2: 22 mmol/L (ref 22–32)
Chloride: 104 mmol/L (ref 101–111)
Creatinine, Ser: 0.98 mg/dL (ref 0.61–1.24)
Glucose, Bld: 164 mg/dL — ABNORMAL HIGH (ref 65–99)
Potassium: 3.6 mmol/L (ref 3.5–5.1)
SODIUM: 137 mmol/L (ref 135–145)

## 2017-07-01 NOTE — ED Triage Notes (Signed)
Pt reports chest pain described as sharp worse with movement. Ongoing since yesterday. Unable to rate on 0-10 scale, states it "just hurts."  Blue top sent to lab by Garlon Hatchet

## 2017-07-02 ENCOUNTER — Emergency Department (HOSPITAL_COMMUNITY)
Admission: EM | Admit: 2017-07-02 | Discharge: 2017-07-02 | Disposition: A | Discharge status: Home / Self Care | Payer: Medicare Other | Attending: Emergency Medicine | Admitting: Emergency Medicine

## 2017-07-02 DIAGNOSIS — R0789 Other chest pain: Secondary | ICD-10-CM

## 2017-07-02 DIAGNOSIS — M25511 Pain in right shoulder: Secondary | ICD-10-CM

## 2017-07-02 MED ORDER — CYCLOBENZAPRINE HCL 10 MG PO TABS: 10.0000 mg | ORAL_TABLET | Freq: Two times a day (BID) | ORAL | 0 refills | Status: DC | PRN

## 2017-07-02 MED ORDER — IBUPROFEN 800 MG PO TABS
800.0000 mg | ORAL_TABLET | Freq: Once | ORAL | Status: AC
  Administered 2017-07-02: 800 mg via ORAL
  Filled 2017-07-02: qty 1

## 2017-07-02 MED ORDER — CYCLOBENZAPRINE HCL 10 MG PO TABS
10.0000 mg | ORAL_TABLET | Freq: Once | ORAL | Status: AC
  Administered 2017-07-02: 10 mg via ORAL
  Filled 2017-07-02: qty 1

## 2017-07-02 MED ORDER — IBUPROFEN 800 MG PO TABS: 800.0000 mg | ORAL_TABLET | Freq: Three times a day (TID) | ORAL | 0 refills | Status: DC

## 2017-07-02 NOTE — ED Provider Notes (Signed)
[facility] DEPT Provider Note   CSN: [id number] Arrival date & time: 07/01/17  2148     History   Chief Complaint Chief Complaint  Patient presents with  . Chest Pain    HPI Roy Martinez is a 61 y.o. male.  Patient presents to the emergency department with chief complaint of right shoulder pain. He states that he has had the pain for the past 4 days. He states the pain radiates into his upper neck, shoulder, and chest. 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 He denies any fever, chills, cough, shortness of breath. Denies any diaphoresis. Symptoms are worsened with position and movement of the right arm. He denies any other associated symptoms. He has not taken anything for her symptoms.   The history is provided by the patient. No language interpreter was used.    Past Medical History:  Diagnosis Date  . Chronic hepatitis C virus infection ([place])    under treatment June 2016  . H/O ETOH abuse   . Smoker    1/2 ppd    Patient Active Problem List   Diagnosis Date Noted  . Smoker 03/28/2017  . Irregular heart beat 03/28/2017  . Abnormal EKG 03/28/2017  . ETOH abuse 03/28/2017  . Chronic hepatitis C without hepatic coma ([place]) 03/11/2017  . Patellar tendon rupture, right, initial encounter 06/26/2016    Past Surgical History:  Procedure Laterality Date  . KNEE ARTHROSCOPY    . KNEE ARTHROSCOPY WITH MENISCAL REPAIR Right 06/26/2016   Procedure: KNEE ARTHROSCOPY WITH MENISCAL REPAIR VS RESECTION;  Surgeon: Meredith Pel, MD;  Location: [facility];  Service: Orthopedics;  Laterality: Right;  . PATELLAR TENDON REPAIR Right 06/26/2016   Procedure: PATELLA TENDON REPAIR;  Surgeon: Meredith Pel, MD;  Location: [facility];  Service: Orthopedics;  Laterality: Right;       Home Medications    Prior to Admission medications   Medication Sig Start Date End Date Taking? Authorizing Provider    Ledipasvir-Sofosbuvir (HARVONI) 90-400 MG TABS Take 1 tablet by mouth daily. 04/02/17   Kuppelweiser, Cassie L, RPH-CPP    Family History Family History  Problem Relation Age of Onset  . Heart failure Mother   . Heart disease Mother   . Heart failure Father   . Arrhythmia Brother   . Colon cancer Neg Hx   . Esophageal cancer Neg Hx   . Rectal cancer Neg Hx   . Stomach cancer Neg Hx     Social History Social History  Substance Use Topics  . Smoking status: Current Every Day Smoker    Packs/day: 0.50    Types: Cigarettes  . Smokeless tobacco: Never Used     Comment: cutting back  . Alcohol use 3.0 - 3.6 oz/week    5 - 6 Cans of beer per week     Comment: pt states daily beer drinker     Allergies   Penicillins   Review of Systems Review of Systems  All other systems reviewed and are negative.    Physical Exam Updated Vital Signs BP (!) 144/75 (BP Location: Right Arm)   Pulse 63   Temp 98.3 F (36.8 C) (Oral)   Resp 19   Ht 6\' 1"  (1.854 m)   Wt 82.4 kg (181 lb 9 oz)   SpO2 98%   BMI 23.95 kg/m   Physical Exam  Constitutional: He is oriented to person, place, and  time. He appears well-developed and well-nourished.  HENT:  Head: Normocephalic and atraumatic.  Eyes: Pupils are equal, round, and reactive to light. Conjunctivae and EOM are normal. Right eye exhibits no discharge. Left eye exhibits no discharge. No scleral icterus.  Neck: Normal range of motion. Neck supple. No JVD present.  Cardiovascular: Normal rate, regular rhythm and normal heart sounds.  Exam reveals no gallop and no friction rub.   No murmur heard. Pulmonary/Chest: Effort normal and breath sounds normal. No respiratory distress. He has no wheezes. He has no rales. He exhibits no tenderness.  Clear to auscultation bilaterally  Abdominal: Soft. He exhibits no distension and no mass. There is no tenderness. There is no rebound and no guarding.  Musculoskeletal: Normal range of motion. He  exhibits no edema or tenderness.  No bony abnormality or deformity of right shoulder Pain is increased with overhead movements  Neurological: He is alert and oriented to person, place, and time.  Skin: Skin is warm and dry.  Psychiatric: He has a normal mood and affect. His behavior is normal. Judgment and thought content normal.  Nursing note and vitals reviewed.    ED Treatments / Results  Labs (all labs ordered are listed, but only abnormal results are displayed) Labs Reviewed  BASIC METABOLIC PANEL - Abnormal; Notable for the following:       Result Value   Glucose, Bld 164 (*)    All other components within normal limits  CBC - Abnormal; Notable for the following:    RBC 4.11 (*)    Hemoglobin 12.7 (*)    All other components within normal limits  I-STAT TROPONIN, ED    EKG  EKG Interpretation None     ED ECG REPORT  I personally interpreted this EKG   Date: 07/02/2017   Rate: 68  Rhythm: normal sinus rhythm  QRS Axis: normal  Intervals: normal  ST/T Wave abnormalities: normal  Conduction Disutrbances:none  Narrative Interpretation:   Old EKG Reviewed: none available    Radiology Dg Chest 2 View  Result Date: 07/01/2017 CLINICAL DATA:  Acute onset of generalized chest pain. Initial encounter. EXAM: CHEST  2 VIEW COMPARISON:  Chest radiograph performed 03/04/2009 FINDINGS: The lungs are well-aerated and clear. There is no evidence of focal opacification, pleural effusion or pneumothorax. The heart is normal in size; the mediastinal contour is within normal limits. No acute osseous abnormalities are seen. IMPRESSION: No acute cardiopulmonary process seen. Electronically Signed   By: Garald Balding M.D.   On: 07/01/2017 22:50    Procedures Procedures (including critical care time)  Medications Ordered in ED Medications - No data to display   Initial Impression / Assessment and Plan / ED Course  I have reviewed the triage vital signs and the nursing  notes.  Pertinent labs & imaging results that were available during my care of the patient were reviewed by me and considered in my medical decision making (see chart for details).  Clinical Course as of Jul 03 411  Tue Jul 02, 2017  0411 EKG 12-Lead [RB]  0411 ED EKG within 10 minutes [RB]    Clinical Course User Index [RB] Montine Circle, PA-C    Patient with right shoulder pain. He states that radiates into his chest and upper neck. It is reproducible with palpation of the right upper trapezius, and also exacerbated with movement of the right shoulder. His EKG, chest x-ray, and laboratory workup are reassuring. Low risk for ACS. Heart score is 2.  Low  risk for PE or DVT. He is not hypoxic nor tachycardic. The pain is reproducible. Will treat with NSAIDs and Flexeril. Recommend primary care follow-up. Patient understands and agrees the plan. Return precautions given.  Final Clinical Impressions(s) / ED Diagnoses   Final diagnoses:  Chest wall pain  Acute pain of right shoulder    New Prescriptions New Prescriptions   CYCLOBENZAPRINE (FLEXERIL) 10 MG TABLET    Take 1 tablet (10 mg total) by mouth 2 (two) times daily as needed for muscle spasms.   IBUPROFEN (ADVIL,MOTRIN) 800 MG TABLET    Take 1 tablet (800 mg total) by mouth 3 (three) times daily.     Montine Circle, PA-C 07/02/17 0165    Orpah Greek, MD 07/02/17 [phone]

## 2017-07-02 NOTE — ED Notes (Signed)
Pt departed in NAD, refused use of wheelchair.  

## 2017-07-03 DIAGNOSIS — H2513 Age-related nuclear cataract, bilateral: Secondary | ICD-10-CM | POA: Diagnosis not present

## 2017-07-09 ENCOUNTER — Encounter: Payer: Self-pay | Admitting: Gastroenterology

## 2017-07-17 ENCOUNTER — Ambulatory Visit: Payer: Self-pay

## 2017-07-29 IMAGING — US US ABDOMEN LIMITED
1 series · 14 of 25 positions shown · non-contrast
Comparison: None.

CLINICAL DATA: Hepatitis-C

EXAM:
US ABDOMEN LIMITED - RIGHT UPPER QUADRANT

[Series 1: us abdomen limited · 0.23mm/px · 14 of 52 slices shown]
[im 1/52]
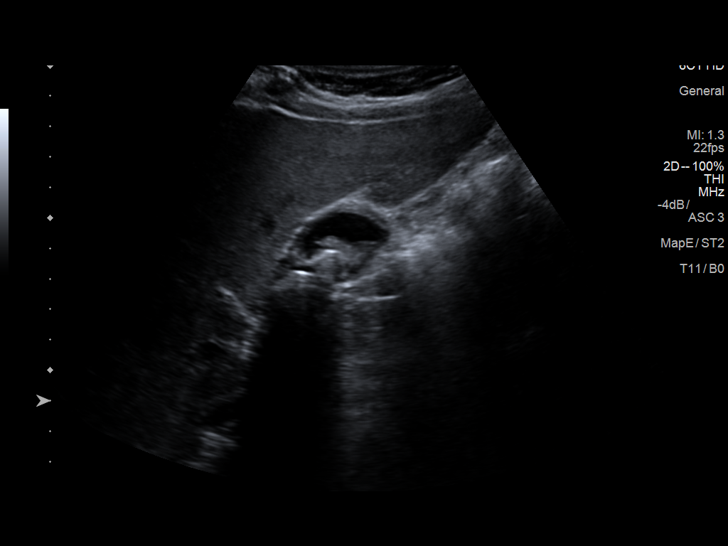
[im 5/52]
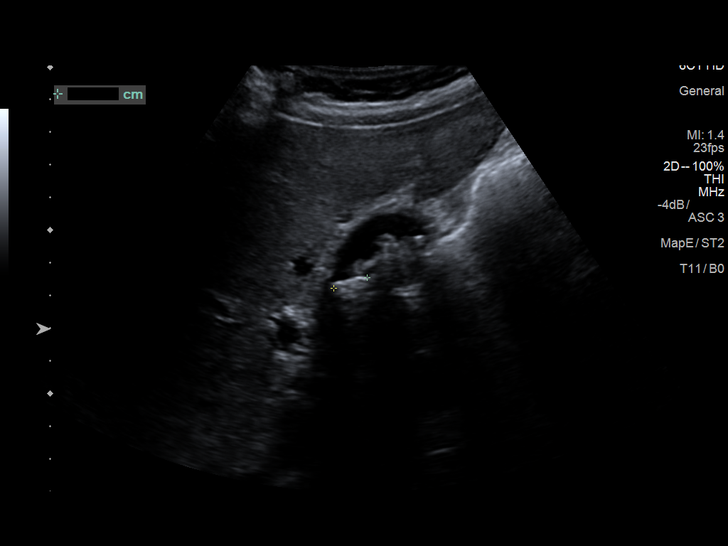
[im 9/52]
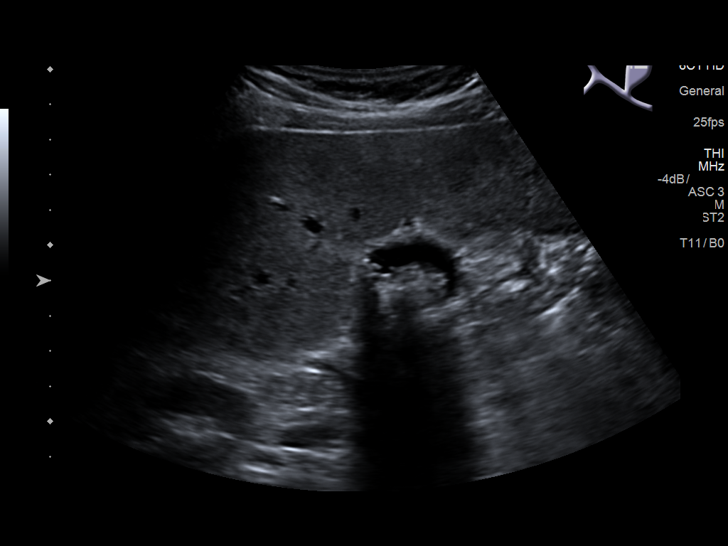
[im 13/52]
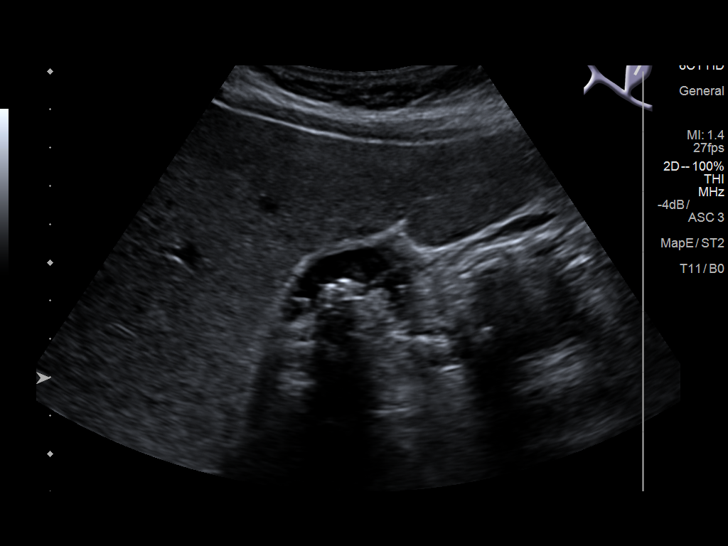
[im 18/52]
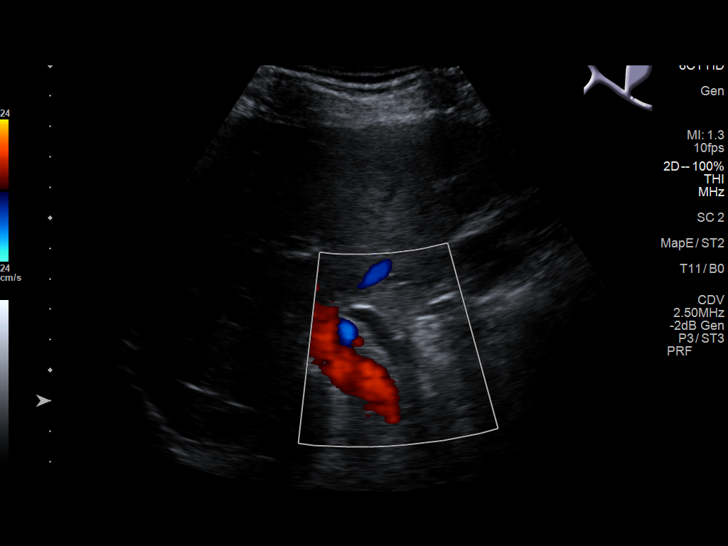
[im 20/52]
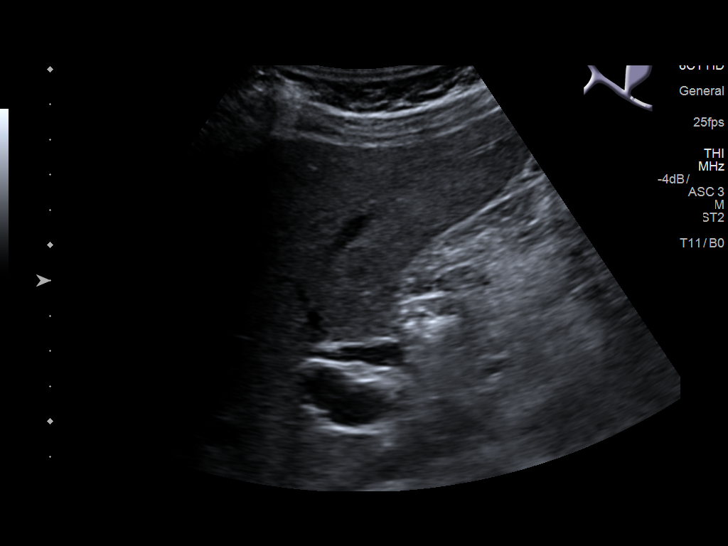
[im 24/52]
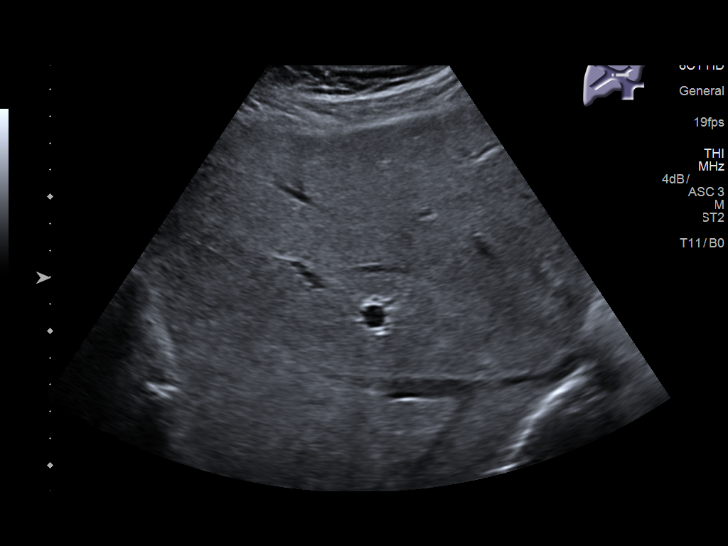
[im 28/52]
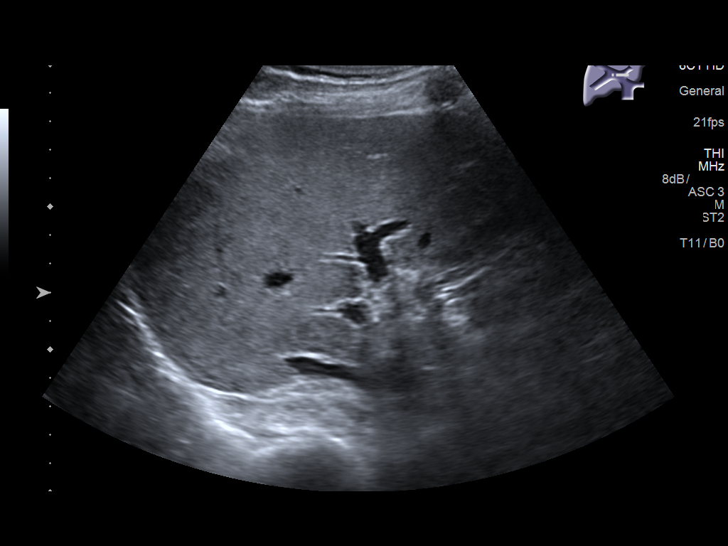
[im 32/52]
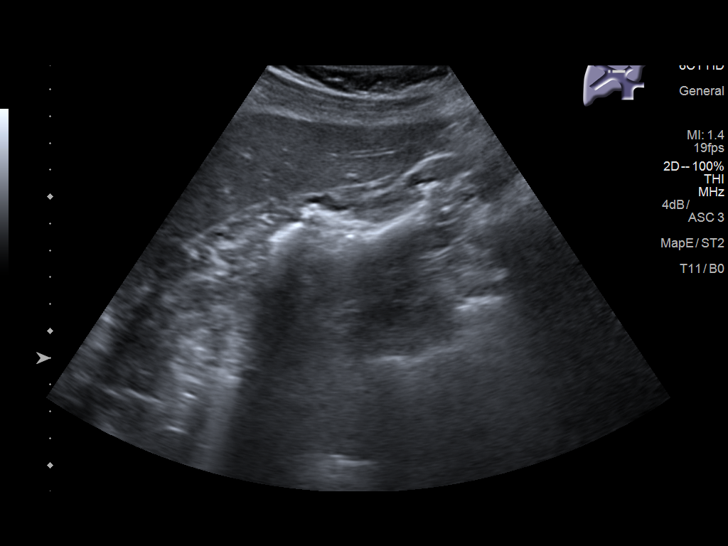
[im 35/52]
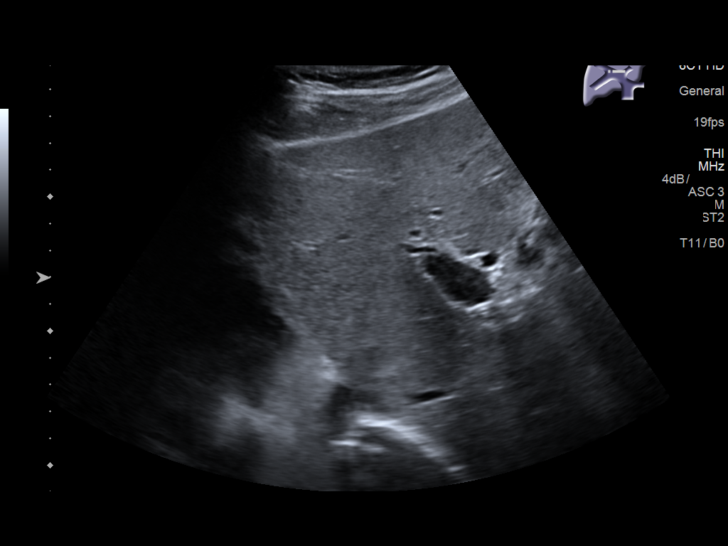
[im 39/52]
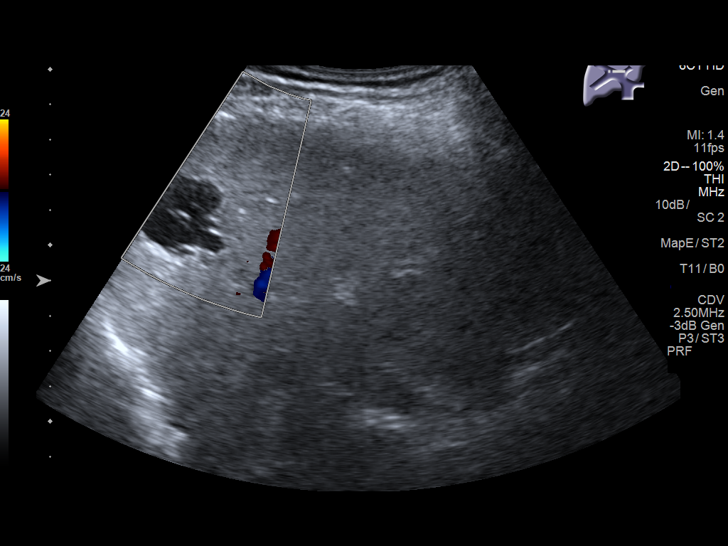
[im 43/52]
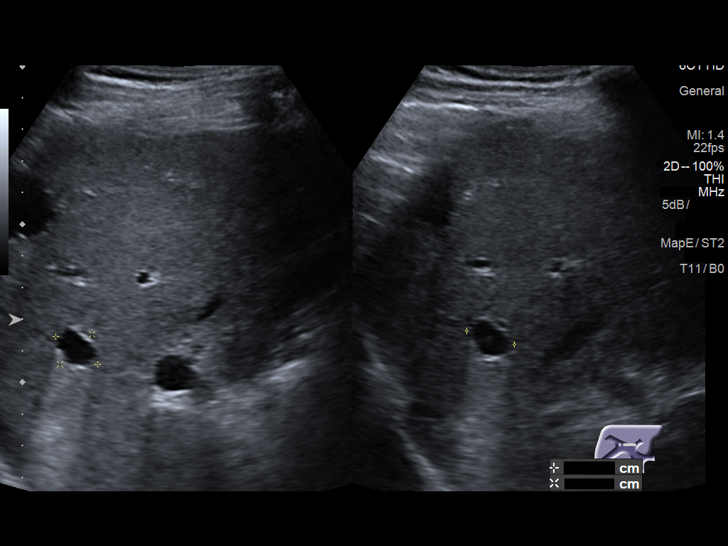
[im 47/52]
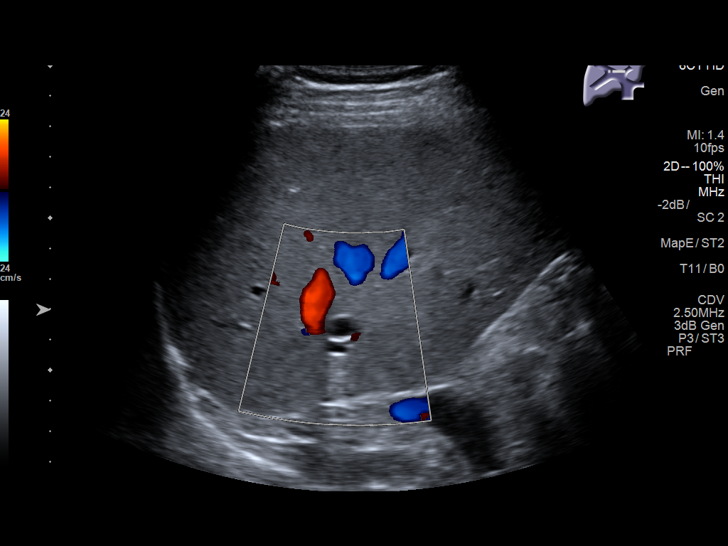
[im 52/52]
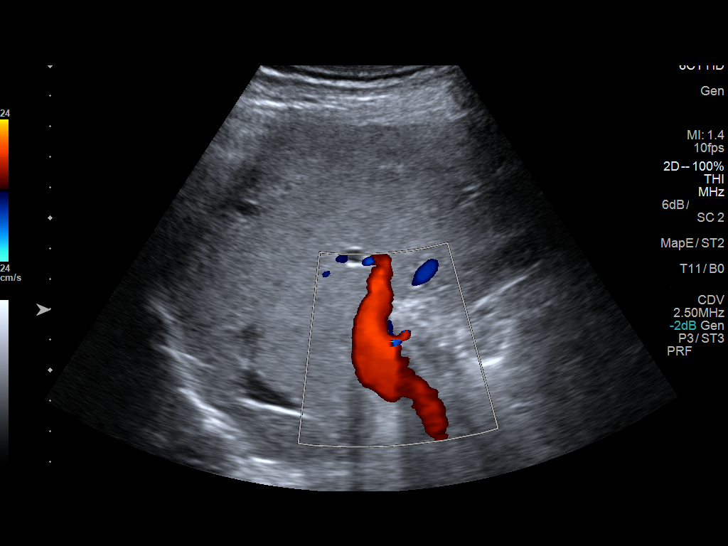

[14 of 25 positions shown; findings below may reference images not displayed]

FINDINGS: Gallbladder:

Gallbladder appears somewhat contracted. There are multiple
echogenic foci in the gallbladder which move and shadow consistent
with gallstones. Largest gallstone measures 1.1 cm in length.
Gallbladder wall thickness is upper normal. There is no appreciable
gallbladder wall edema or pericholecystic fluid. No sonographic
Murphy sign noted by sonographer.

Common bile duct:

Diameter: 5 mm. There is no intrahepatic or extrahepatic biliary
duct dilatation.

Liver:

In the anterior segment of the right lobe of liver, there is a
mildly septated cyst measuring 3.1 x 2.7 x 2.3 cm. There is a cyst
in the anterior segment of the right lobe of the liver measuring
x 1.4 x 1.6 cm. A nearby third cyst measures 1.1 x 0.7 x 1.2 cm.
Within normal limits in parenchymal echogenicity.
IMPRESSION: Cholelithiasis with gallbladder mildly contracted.

There are cystic areas in the liver with the largest lesion showing
evidence of septation. The overall liver echogenicity is within
normal limits.

## 2017-08-24 IMAGING — US US ABDOMEN COMPLETE W/ ELASTOGRAPHY
1 series · 13 of 25 positions shown · non-contrast
Comparison: None.

CLINICAL DATA: Hepatitis-C



[Series 1: us abdomen complete w/ elastography · 0.19mm/px · 13 of 107 slices shown]
[im 1/107]
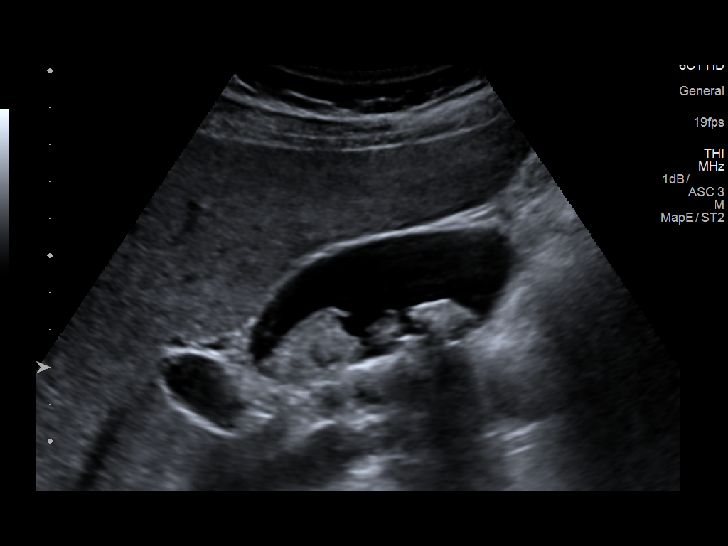
[im 9/107]
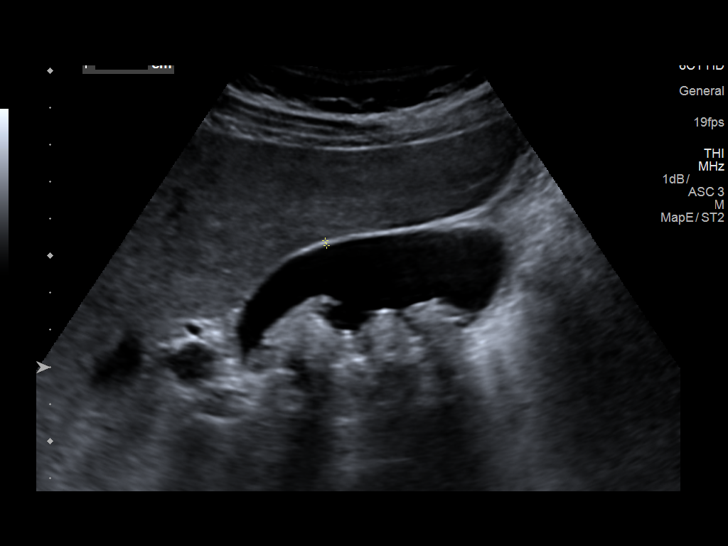
[im 18/107]
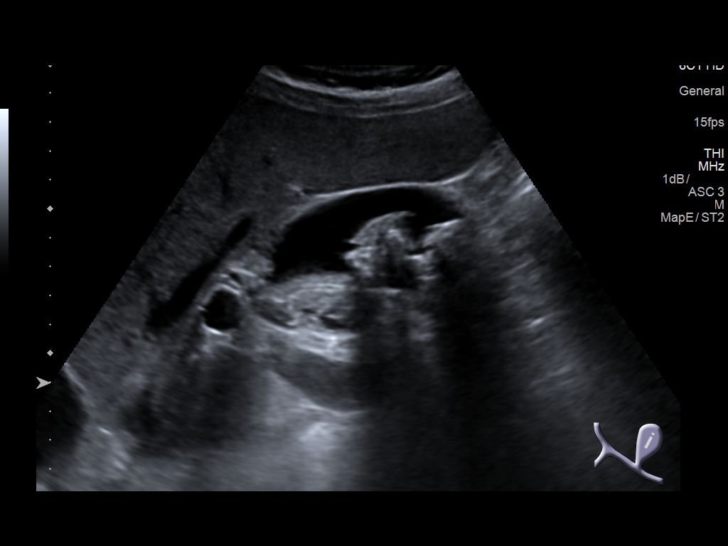
[im 27/107]
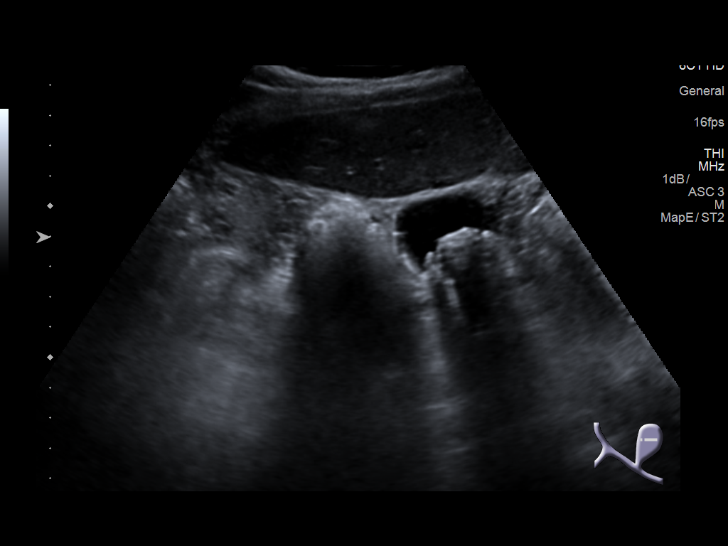
[im 36/107]
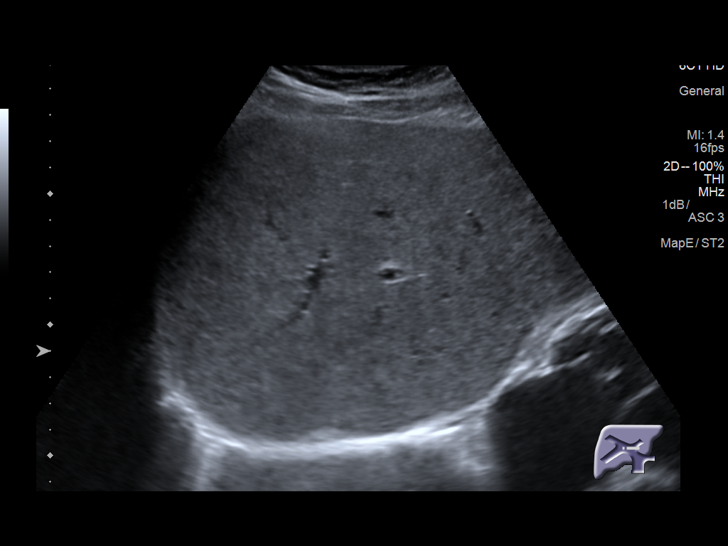
[im 45/107]
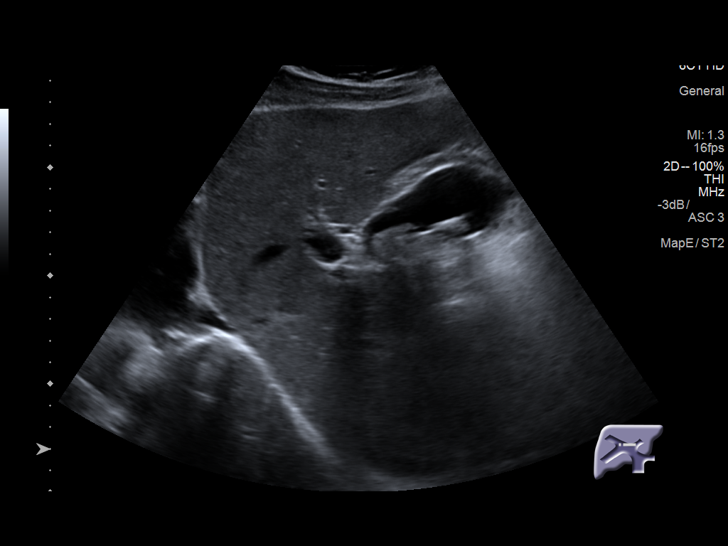
[im 54/107]
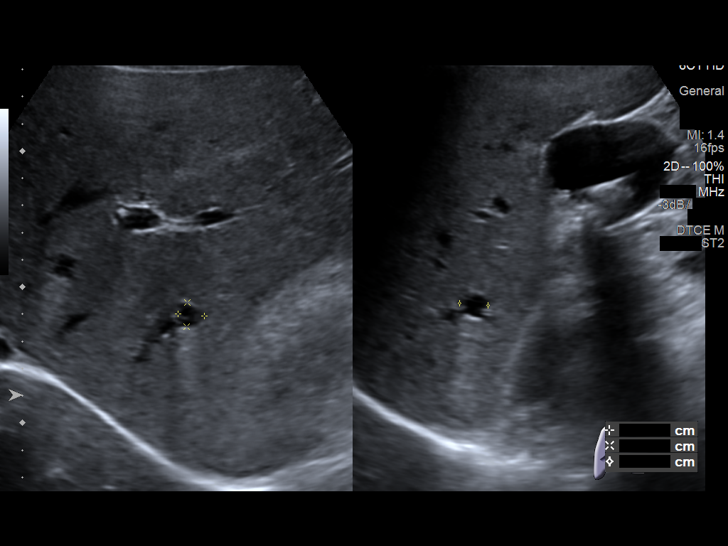
[im 62/107]
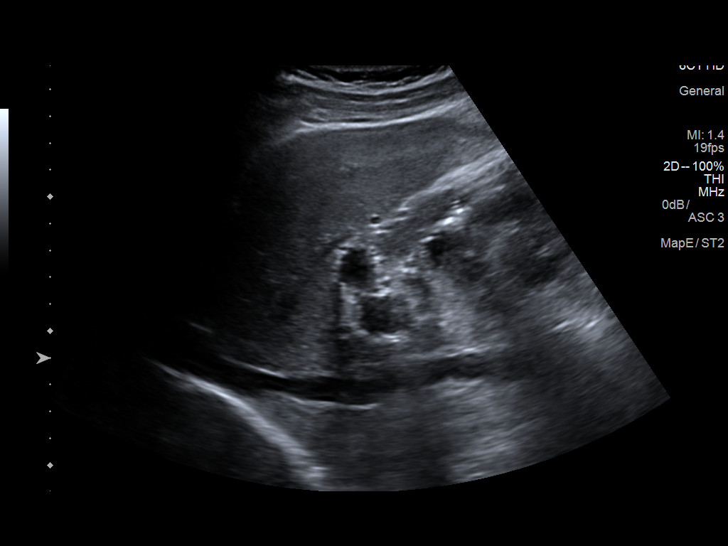
[im 71/107]
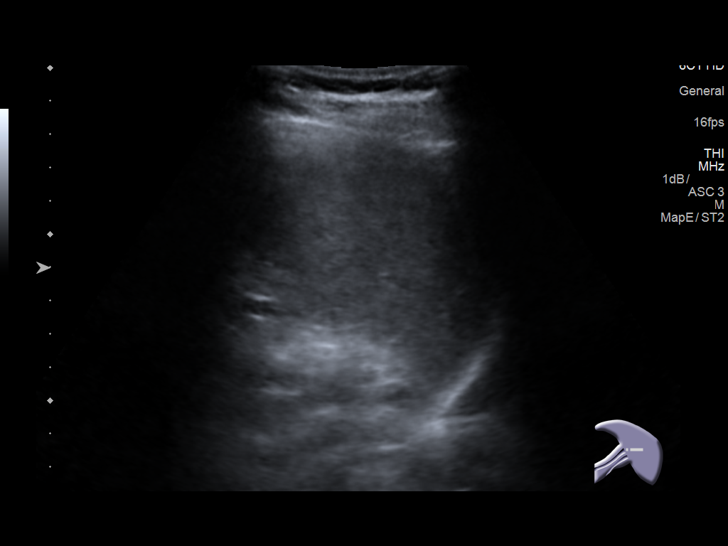
[im 80/107]
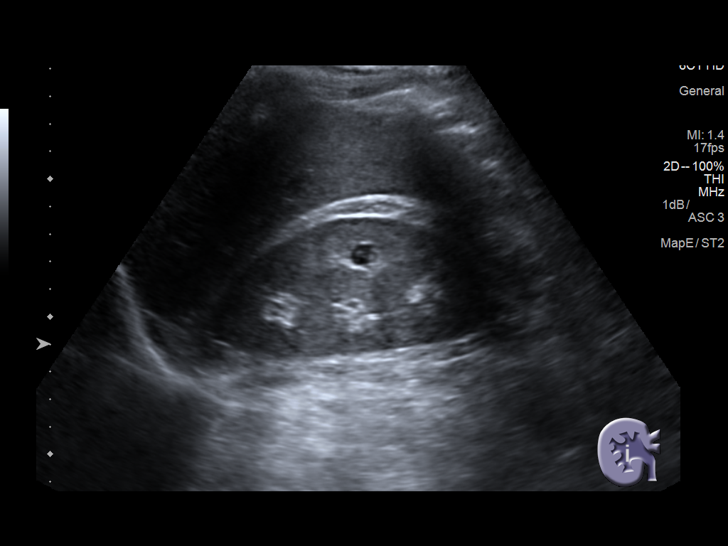
[im 89/107]
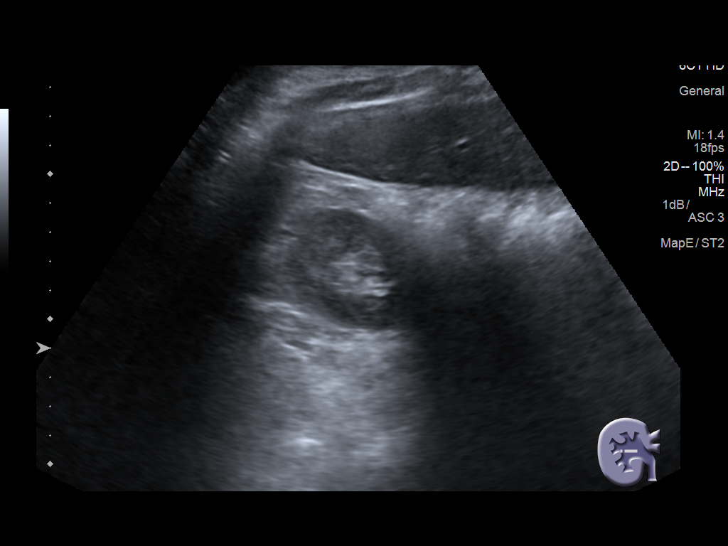
[im 98/107]
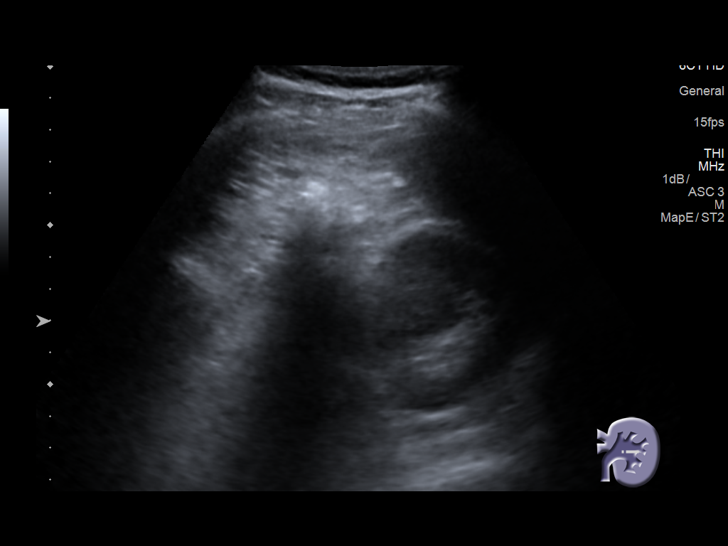
[im 107/107]
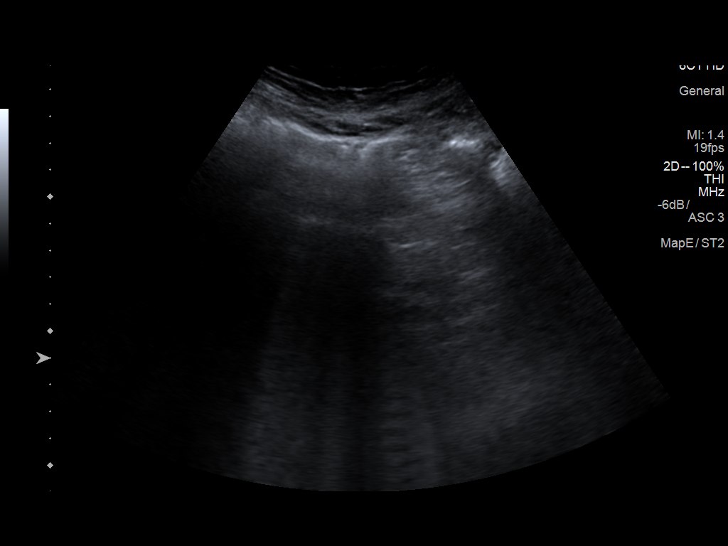

[13 of 25 positions shown; findings below may reference images not displayed]

FINDINGS: ULTRASOUND ABDOMEN

Gallbladder: No gallstones or wall thickening visualized. No
sonographic Murphy sign noted by sonographer.

Common bile duct: Diameter: 6 mm in diameter

Liver: Cystic areas within the right lobe of the liver, the largest
2.8 cm in the right hepatic lobe with thin internal septations.

IVC: No abnormality visualized.

Pancreas: Visualized portion unremarkable.

Spleen: Size and appearance within normal limits.

Right Kidney: Length: 10.2 cm. 11 mm cyst in the midpole..
Echogenicity within normal limits. No mass or hydronephrosis
visualized.

Left Kidney: Length: 12.1 cm. Echogenicity within normal limits. No
mass or hydronephrosis visualized.

Abdominal aorta: No aneurysm visualized.

Other findings: None.

ULTRASOUND HEPATIC ELASTOGRAPHY

Device: Siemens Helix VTQ

Patient position: Supine

Transducer 6C1

Number of measurements: 10

Hepatic segment:  8

Median velocity:   1.20  m/sec

IQR:

IQR/Median velocity ratio:

Corresponding Metavir fibrosis score:  F0/F1

Risk of fibrosis: Minimal

Limitations of exam: None

Pertinent findings noted on other imaging exams:  None

Please note that abnormal shear wave velocities may also be
identified in clinical settings other than with hepatic fibrosis,
such as: acute hepatitis, elevated right heart and central venous
pressures including use of beta blockers, Riedel disease
([place]), infiltrative processes such as
mastocytosis/amyloidosis/infiltrative tumor, extrahepatic
cholestasis, in the post-prandial state, and liver transplantation.
Correlation with patient history, laboratory data, and clinical
condition recommended.
IMPRESSION: ULTRASOUND ABDOMEN:
No acute findings.  Hepatic and right renal cysts.

ULTRASOUND HEPATIC ELASTOGRAPHY:

Median hepatic shear wave velocity is calculated at 1.20 m/sec.

Corresponding Metavir fibrosis score is  F0/F1.

Risk of fibrosis is Minimal.

Follow-up: None required

## 2017-12-28 ENCOUNTER — Encounter (HOSPITAL_COMMUNITY): Payer: Self-pay

## 2017-12-28 ENCOUNTER — Emergency Department (HOSPITAL_COMMUNITY)
Admission: EM | Admit: 2017-12-28 | Discharge: 2017-12-28 | Disposition: A | Discharge status: Home / Self Care | Payer: Medicare Other | Attending: Emergency Medicine | Admitting: Emergency Medicine

## 2017-12-28 ENCOUNTER — Emergency Department (HOSPITAL_COMMUNITY): Payer: Medicare Other

## 2017-12-28 DIAGNOSIS — M545 Low back pain, unspecified: Secondary | ICD-10-CM

## 2017-12-28 DIAGNOSIS — F17210 Nicotine dependence, cigarettes, uncomplicated: Secondary | ICD-10-CM | POA: Diagnosis not present

## 2017-12-28 DIAGNOSIS — R1110 Vomiting, unspecified: Secondary | ICD-10-CM | POA: Diagnosis not present

## 2017-12-28 DIAGNOSIS — R109 Unspecified abdominal pain: Secondary | ICD-10-CM | POA: Insufficient documentation

## 2017-12-28 DIAGNOSIS — K8020 Calculus of gallbladder without cholecystitis without obstruction: Secondary | ICD-10-CM | POA: Diagnosis not present

## 2017-12-28 LAB — COMPREHENSIVE METABOLIC PANEL
ALT: 23 U/L (ref 17–63)
AST: 27 U/L (ref 15–41)
Albumin: 3.7 g/dL (ref 3.5–5.0)
Alkaline Phosphatase: 55 U/L (ref 38–126)
Anion gap: 11 (ref 5–15)
BILIRUBIN TOTAL: 0.9 mg/dL (ref 0.3–1.2)
BUN: 14 mg/dL (ref 6–20)
CHLORIDE: 104 mmol/L (ref 101–111)
CO2: 22 mmol/L (ref 22–32)
CREATININE: 0.67 mg/dL (ref 0.61–1.24)
Calcium: 9 mg/dL (ref 8.9–10.3)
GFR calc Af Amer: >60 mL/min (ref >60)
Glucose, Bld: 113 mg/dL — ABNORMAL HIGH (ref 65–99)
Potassium: 3.6 mmol/L (ref 3.5–5.1)
Sodium: 137 mmol/L (ref 135–145)
Total Protein: 7.2 g/dL (ref 6.5–8.1)

## 2017-12-28 LAB — CBC
HCT: 43 % (ref 39.0–52.0)
Hemoglobin: 14 g/dL (ref 13.0–17.0)
MCH: 30.8 pg (ref 26.0–34.0)
MCHC: 32.6 g/dL (ref 30.0–36.0)
MCV: 94.5 fL (ref 78.0–100.0)
PLATELETS: 331 10*3/uL (ref 150–400)
RBC: 4.55 MIL/uL (ref 4.22–5.81)
RDW: 12.4 % (ref 11.5–15.5)
WBC: 6.6 10*3/uL (ref 4.0–10.5)

## 2017-12-28 LAB — URINALYSIS, ROUTINE W REFLEX MICROSCOPIC
BILIRUBIN URINE: NEGATIVE
Bacteria, UA: NONE SEEN
Glucose, UA: NEGATIVE mg/dL
Ketones, ur: NEGATIVE mg/dL
LEUKOCYTES UA: NEGATIVE
Nitrite: NEGATIVE
PH: 5 (ref 5.0–8.0)
Protein, ur: NEGATIVE mg/dL
SPECIFIC GRAVITY, URINE: 1.03 (ref 1.005–1.030)

## 2017-12-28 LAB — LIPASE, BLOOD: Lipase: 24 U/L (ref 11–51)

## 2017-12-28 MED ORDER — SODIUM CHLORIDE 0.9 % IV BOLUS
1000.0000 mL | Freq: Once | INTRAVENOUS | Status: AC
  Administered 2017-12-28: 1000 mL via INTRAVENOUS

## 2017-12-28 MED ORDER — ONDANSETRON HCL 4 MG/2ML IJ SOLN
4.0000 mg | Freq: Once | INTRAMUSCULAR | Status: AC
  Administered 2017-12-28: 4 mg via INTRAVENOUS
  Filled 2017-12-28: qty 2

## 2017-12-28 MED ORDER — METHOCARBAMOL 500 MG PO TABS: 500.0000 mg | ORAL_TABLET | Freq: Two times a day (BID) | ORAL | 0 refills | Status: DC

## 2017-12-28 MED ORDER — IBUPROFEN 600 MG PO TABS: 600.0000 mg | ORAL_TABLET | Freq: Four times a day (QID) | ORAL | 0 refills | Status: DC | PRN

## 2017-12-28 MED ORDER — KETOROLAC TROMETHAMINE 15 MG/ML IJ SOLN
15.0000 mg | Freq: Once | INTRAMUSCULAR | Status: AC
  Administered 2017-12-28: 15 mg via INTRAVENOUS
  Filled 2017-12-28: qty 1

## 2017-12-28 MED ORDER — METHOCARBAMOL 500 MG PO TABS
1000.0000 mg | ORAL_TABLET | Freq: Once | ORAL | Status: AC
  Administered 2017-12-28: 1000 mg via ORAL
  Filled 2017-12-28: qty 2

## 2017-12-28 NOTE — ED Triage Notes (Signed)
Patient complains of left sided abdominal pain since am with vomiting. denis diarrhea, denies dysuria. Alert and oriented

## 2017-12-28 NOTE — Discharge Instructions (Signed)
Evaluation today is reassuring, labs look good and CT shows no acute problem with your kidney or abdomen.  I think this pain is likely musculoskeletal low back pain.  He may use ibuprofen and Robaxin to treat your pain, I encourage you to use ice and heat as well, you can also try salonpas patches, which are available over the counter.  Please follow-up with your primary care doctor in the next week.  Return to the emergency department for severely worsening pain, focal abdominal pain, fevers or chills, persistent nausea and vomiting, numbness or tingling or other new or concerning symptoms.

## 2017-12-28 NOTE — ED Provider Notes (Signed)
[facility] EMERGENCY DEPARTMENT Provider Note   CSN: [id number] Arrival date & time: 12/28/17  1407     History   Chief Complaint No chief complaint on file.   HPI Roy Martinez is a 62 y.o. male.  Roy Martinez is a 62 y.o. Male with a history of chronic hepatitis C status post treatment with Harvoni, tobacco use and alcohol abuse, who presents to the ED for evaluation of left flank pain and one episode of emesis.  Patient reports he has been having intermittent left flank pain for the past month but over the past few days it has been getting worse, he describes it as intermittent sharp pains that go down into his lower back.  Patient reports this morning he had one episode of emesis where he "threw up some mucus".  Patient denies coughing aside from his chronic intermittent smoking cough.  Patient denies any additional episodes of emesis, no hematemesis.  Patient denies focal abdominal pain.  No diarrhea, melena or hematochezia.  Patient denies fevers or chills.  He denies dysuria, frequency or hematuria, no history of kidney stones.  Patient denies any lower extremity numbness, weakness or tingling.  No history of cancer or IV drug use.     Past Medical History:  Diagnosis Date  . Chronic hepatitis C virus infection ([place])    under treatment June 2016  . H/O ETOH abuse   . Smoker    1/2 ppd    Patient Active Problem List   Diagnosis Date Noted  . Smoker 03/28/2017  . Irregular heart beat 03/28/2017  . Abnormal EKG 03/28/2017  . ETOH abuse 03/28/2017  . Chronic hepatitis C without hepatic coma ([place]) 03/11/2017  . Patellar tendon rupture, right, initial encounter 06/26/2016    Past Surgical History:  Procedure Laterality Date  . KNEE ARTHROSCOPY    . KNEE ARTHROSCOPY WITH MENISCAL REPAIR Right 06/26/2016   Procedure: KNEE ARTHROSCOPY WITH MENISCAL REPAIR VS RESECTION;  Surgeon: Meredith Pel, MD;  Location: [facility];  Service: Orthopedics;  Laterality:  Right;  . PATELLAR TENDON REPAIR Right 06/26/2016   Procedure: PATELLA TENDON REPAIR;  Surgeon: Meredith Pel, MD;  Location: [facility];  Service: Orthopedics;  Laterality: Right;        Home Medications    Prior to Admission medications   Medication Sig Start Date End Date Taking? Authorizing Provider  cyclobenzaprine (FLEXERIL) 10 MG tablet Take 1 tablet (10 mg total) by mouth 2 (two) times daily as needed for muscle spasms. 07/02/17   Montine Circle, PA-C  ibuprofen (ADVIL,MOTRIN) 800 MG tablet Take 1 tablet (800 mg total) by mouth 3 (three) times daily. 07/02/17   Montine Circle, PA-C  Ledipasvir-Sofosbuvir (HARVONI) 90-400 MG TABS Take 1 tablet by mouth daily. 04/02/17   Kuppelweiser, Cassie L, RPH-CPP    Family History Family History  Problem Relation Age of Onset  . Heart failure Mother   . Heart disease Mother   . Heart failure Father   . Arrhythmia Brother   . Colon cancer Neg Hx   . Esophageal cancer Neg Hx   . Rectal cancer Neg Hx   . Stomach cancer Neg Hx     Social History Social History   Tobacco Use  . Smoking status: Current Every Day Smoker    Packs/day: 0.50    Types: Cigarettes  . Smokeless tobacco: Never Used  . Tobacco comment: cutting back  Substance Use Topics  . Alcohol use: Yes    Alcohol/week: 3.0 -  3.6 oz    Types: 5 - 6 Cans of beer per week    Comment: pt states daily beer drinker  . Drug use: Yes    Types: "Crack" cocaine    Comment: Quit 4 years ago     Allergies   Penicillins   Review of Systems Review of Systems  Constitutional: Negative for chills and fever.  HENT: Negative for congestion, rhinorrhea and sore throat.   Eyes: Negative for visual disturbance.  Respiratory: Negative for shortness of breath.   Cardiovascular: Negative for chest pain.  Gastrointestinal: Positive for vomiting. Negative for abdominal pain, blood in stool, diarrhea and nausea.  Genitourinary: Positive for flank pain. Negative for dysuria,  frequency, hematuria, penile pain, scrotal swelling and testicular pain.  Musculoskeletal: Positive for back pain. Negative for gait problem, neck pain and neck stiffness.  Skin: Negative for color change and rash.  Neurological: Negative for weakness and numbness.  All other systems reviewed and are negative.    Physical Exam Updated Vital Signs BP 138/87 (BP Location: Right Arm)   Pulse (!) 51   Temp 97.6 F (36.4 C) (Oral)   Resp 16   Ht 6\' 1"  (1.854 m)   Wt 86.2 kg (190 lb)   SpO2 99%   BMI 25.07 kg/m   Physical Exam  Constitutional: He appears well-developed and well-nourished. No distress.  HENT:  Head: Normocephalic and atraumatic.  Eyes: Right eye exhibits no discharge. Left eye exhibits no discharge.  Neck: Neck supple.  Cardiovascular: Normal rate, regular rhythm, normal heart sounds and intact distal pulses.  Pulmonary/Chest: Effort normal and breath sounds normal. No stridor. No respiratory distress. He has no wheezes. He has no rales.  Respirations equal and unlabored, patient able to speak in full sentences, lungs clear to auscultation bilaterally  Abdominal: Soft. Bowel sounds are normal. He exhibits no distension and no mass. There is no tenderness. There is no guarding.  Abdomen soft, nondistended, bowel sounds present, all quadrants nontender to palpation without guarding or rebound tenderness.  No CVA tenderness.  Musculoskeletal:  There is tenderness over the left flank and back without erythema, swelling or palpable deformity, there is no focal midline lumbar or thoracic spinal tenderness, tenderness extends down lower back into the buttock, negative straight leg raise.  Neurological: He is alert. Coordination normal.  Speech is clear, able to follow commands Normal strength in upper and lower extremities bilaterally including dorsiflexion and plantar flexion, strong and equal grip strength Sensation normal to light and sharp touch Moves extremities without  ataxia, coordination intact  Skin: Skin is warm and dry. Capillary refill takes less than 2 seconds. He is not diaphoretic.  Psychiatric: He has a normal mood and affect. His behavior is normal.  Nursing note and vitals reviewed.    ED Treatments / Results  Labs (all labs ordered are listed, but only abnormal results are displayed) Labs Reviewed  COMPREHENSIVE METABOLIC PANEL - Abnormal; Notable for the following components:      Result Value   Glucose, Bld 113 (*)    All other components within normal limits  URINALYSIS, ROUTINE W REFLEX MICROSCOPIC - Abnormal; Notable for the following components:   Hgb urine dipstick SMALL (*)    Squamous Epithelial / LPF 0-5 (*)    All other components within normal limits  LIPASE, BLOOD  CBC    EKG EKG Interpretation  Date/Time:  Saturday December 28 2017 18:02:08 EDT Ventricular Rate:  45 PR Interval:    QRS Duration: 98  QT Interval:  483 QTC Calculation: 418 R Axis:   76 Text Interpretation:  Sinus bradycardia Since last tracing rate slower Confirmed by Quintella Reichert [phone]) on 12/28/2017 6:09:03 PM   Radiology Ct Renal Stone Study  Result Date: 12/28/2017 CLINICAL DATA:  Left abdomen pain since this morning with vomiting. EXAM: CT ABDOMEN AND PELVIS WITHOUT CONTRAST TECHNIQUE: Multidetector CT imaging of the abdomen and pelvis was performed following the standard protocol without IV contrast. COMPARISON:  None. FINDINGS: Lower chest: No acute abnormality. Hepatobiliary: There are several low-density lesions within the liver, largest in the posterior segment right lobe liver measuring 2.5 x 2.8 cm. Evaluation is limited due to lack of contrast. These may represent liver cysts. Gallstone is identified within the gallbladder. There is no inflammation surrounding the gallbladder. The biliary tree is normal. Pancreas: Unremarkable. No pancreatic ductal dilatation or surrounding inflammatory changes. Spleen: Normal in size without focal  abnormality. Adrenals/Urinary Tract: Diffuse atrophy of bilateral adrenal glands without focal mass is noted. No focal renal lesions identified. There is no hydronephrosis bilaterally. Fluid-filled bladder is normal. Stomach/Bowel: There is a small hiatal hernia. The stomach is otherwise normal. There is no small bowel obstruction. There is diverticulosis of colon without diverticulitis. The appendix is normal. Vascular/Lymphatic: Aortic atherosclerosis. No enlarged abdominal or pelvic lymph nodes. Reproductive: Prostate is unremarkable. Other: There is 1.8 cm umbilical herniation of mesenteric fat. Musculoskeletal: Degenerative joint changes of the spine are identified. IMPRESSION: No acute abnormality identified in the abdomen and pelvis. No hydronephrosis identified bilaterally. No bowel obstruction or diverticulitis.  The appendix is normal. Gallstones identified. Several low-density lesions within the liver, evaluation is limited without contrast, favor liver cysts. Electronically Signed   By: Abelardo Diesel M.D.   On: 12/28/2017 17:43    Procedures Procedures (including critical care time)  Medications Ordered in ED Medications  ketorolac (TORADOL) 15 MG/ML injection 15 mg (15 mg Intravenous Given 12/28/17 1747)  sodium chloride 0.9 % bolus 1,000 mL (0 mLs Intravenous Stopped 12/28/17 2122)  ondansetron (ZOFRAN) injection 4 mg (4 mg Intravenous Given 12/28/17 1747)  methocarbamol (ROBAXIN) tablet 1,000 mg (1,000 mg Oral Given 12/28/17 1747)     Initial Impression / Assessment and Plan / ED Course  I have reviewed the triage vital signs and the nursing notes.  Pertinent labs & imaging results that were available during my care of the patient were reviewed by me and considered in my medical decision making (see chart for details).  Patient presents to the emergency department for evaluation of 1 month of left flank and lower back pain which is been acutely worsening over the past 2 days and patient  had an episode of emesis today.  No associated fevers or chills, focal abdominal pain, chest pain or shortness of breath, diarrhea, bloody stools.  On exam patient with normal vitals, and appears to be in no acute distress.  Tenderness to palpation over the left flank and left lower back no focal midline spinal tenderness, abdomen is benign, lungs clear.  Labs obtained in triage are overall reassuring, no leukocytosis, normal hemoglobin, no electrolyte derangements requiring intervention, normal renal and liver function, normal lipase, urinalysis does show a small amount of hematuria, given flank pain will plan to do CT renal stone study to rule out any kidney stone or renal issue causing patient's pain.  Patient had a single episode of emesis without any further vomiting I suspect that there is likely a musculoskeletal component, will treat with IV fluids, Zofran, Toradol and  Robaxin and reevaluate patient for symptomatic improvement.  CT shows no acute intra-abdominal abnormalities no evidence of kidney stone or obstructive uropathy.  There are some gallstones present without signs of acute cholecystitis, there are several low-density lesions within the liver which are likely liver cyst will have patient follow-up outpatient regarding this.  On reevaluation patient reports improvement in his pain.  At this time he stable for discharge home with close follow-up with his primary care doctor.  Will treat with ibuprofen and Robaxin.  Strict return precautions discussed.  Patient expressed understanding and is in agreement with plan.  Final Clinical Impressions(s) / ED Diagnoses   Final diagnoses:  Left flank pain  Acute left-sided low back pain without sciatica    ED Discharge Orders        Ordered    ibuprofen (IBU) 600 MG tablet  Every 6 hours PRN     12/28/17 2106    methocarbamol (ROBAXIN) 500 MG tablet  2 times daily     12/28/17 2106       Janet Berlin 12/28/17 2232    Quintella Reichert, MD 12/29/17 1153

## 2018-09-17 ENCOUNTER — Emergency Department (HOSPITAL_COMMUNITY)
Admission: EM | Admit: 2018-09-17 | Discharge: 2018-09-17 | Disposition: A | Discharge status: Home / Self Care | Payer: Medicare Other | Attending: Emergency Medicine | Admitting: Emergency Medicine

## 2018-09-17 ENCOUNTER — Other Ambulatory Visit: Payer: Self-pay

## 2018-09-17 ENCOUNTER — Encounter (HOSPITAL_COMMUNITY): Payer: Self-pay

## 2018-09-17 DIAGNOSIS — X58XXXA Exposure to other specified factors, initial encounter: Secondary | ICD-10-CM | POA: Insufficient documentation

## 2018-09-17 DIAGNOSIS — R457 State of emotional shock and stress, unspecified: Secondary | ICD-10-CM | POA: Insufficient documentation

## 2018-09-17 DIAGNOSIS — Y939 Activity, unspecified: Secondary | ICD-10-CM | POA: Insufficient documentation

## 2018-09-17 DIAGNOSIS — S46911A Strain of unspecified muscle, fascia and tendon at shoulder and upper arm level, right arm, initial encounter: Secondary | ICD-10-CM | POA: Diagnosis not present

## 2018-09-17 DIAGNOSIS — F17210 Nicotine dependence, cigarettes, uncomplicated: Secondary | ICD-10-CM | POA: Diagnosis not present

## 2018-09-17 DIAGNOSIS — S46811A Strain of other muscles, fascia and tendons at shoulder and upper arm level, right arm, initial encounter: Secondary | ICD-10-CM

## 2018-09-17 DIAGNOSIS — Y999 Unspecified external cause status: Secondary | ICD-10-CM | POA: Diagnosis not present

## 2018-09-17 DIAGNOSIS — Y929 Unspecified place or not applicable: Secondary | ICD-10-CM | POA: Insufficient documentation

## 2018-09-17 DIAGNOSIS — M542 Cervicalgia: Secondary | ICD-10-CM

## 2018-09-17 MED ORDER — IBUPROFEN 400 MG PO TABS
600.0000 mg | ORAL_TABLET | Freq: Once | ORAL | Status: AC
  Administered 2018-09-17: 16:00:00 600 mg via ORAL
  Filled 2018-09-17: qty 1

## 2018-09-17 MED ORDER — IBUPROFEN 600 MG PO TABS: 600.0000 mg | ORAL_TABLET | Freq: Four times a day (QID) | ORAL | 0 refills | Status: DC | PRN

## 2018-09-17 MED ORDER — METHOCARBAMOL 500 MG PO TABS: 500.0000 mg | ORAL_TABLET | Freq: Every evening | ORAL | 0 refills | Status: DC | PRN

## 2018-09-17 MED ORDER — METHOCARBAMOL 500 MG PO TABS
500.0000 mg | ORAL_TABLET | Freq: Once | ORAL | Status: AC
  Administered 2018-09-17: 500 mg via ORAL
  Filled 2018-09-17: qty 1

## 2018-09-17 NOTE — ED Triage Notes (Signed)
Pain in right neck, arm, shoulder began on Monday. 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

## 2018-09-17 NOTE — ED Provider Notes (Signed)
[facility] EMERGENCY DEPARTMENT Provider Note   CSN: [id number] Arrival date & time: 09/17/18  1511     History   Chief Complaint Chief Complaint  Patient presents with  . Neck Pain    Rt head, neck, shoulder, arm pain     HPI Roy Martinez is a 62 y.o. male who presents with right neck and shoulder pain. PMH significant for hx of Hep C, hx of ETOH abuse, smoking. He states that for the past 2 days he has had gradually worsening right sided neck and posterior shoulder pain. Movement of his head and right arm make it worse. Being still, using heat, and rubbing the area make it better. He denies headache, vision changes, dizziness, chest pain, paresthesias, arm weakness. He washes cars for a living and wonders if this could make it worse. He is left hand dominant. He also started to tell me about multiple social stressors he is having. He put his name on the loan for two cars but put his girlfriend's name on the title and she took one of the cars to [place] and the other was repossessed. His daughter didn't invite him to her graduation from nursing school and his other daughter cursed him out on the phone on his birthday. He states he hasn't really been taking care of himself.   HPI  Past Medical History:  Diagnosis Date  . Chronic hepatitis C virus infection ([place])    under treatment June 2016  . H/O ETOH abuse   . Smoker    1/2 ppd    Patient Active Problem List   Diagnosis Date Noted  . Smoker 03/28/2017  . Irregular heart beat 03/28/2017  . Abnormal EKG 03/28/2017  . ETOH abuse 03/28/2017  . Chronic hepatitis C without hepatic coma ([place]) 03/11/2017  . Patellar tendon rupture, right, initial encounter 06/26/2016    Past Surgical History:  Procedure Laterality Date  . KNEE ARTHROSCOPY    . KNEE ARTHROSCOPY WITH MENISCAL REPAIR Right 06/26/2016   Procedure: KNEE ARTHROSCOPY WITH MENISCAL REPAIR VS RESECTION;  Surgeon: Meredith Pel, MD;  Location: [facility];   Service: Orthopedics;  Laterality: Right;  . PATELLAR TENDON REPAIR Right 06/26/2016   Procedure: PATELLA TENDON REPAIR;  Surgeon: Meredith Pel, MD;  Location: [facility];  Service: Orthopedics;  Laterality: Right;        Home Medications    Prior to Admission medications   Medication Sig Start Date End Date Taking? Authorizing Provider  ibuprofen (IBU) 600 MG tablet Take 1 tablet (600 mg total) by mouth every 6 (six) hours as needed. Patient not taking: Reported on 09/17/2018 12/28/17   Jacqlyn Larsen, PA-C  Ledipasvir-Sofosbuvir (HARVONI) 90-400 MG TABS Take 1 tablet by mouth daily. Patient not taking: Reported on 12/28/2017 04/02/17   Kuppelweiser, Cassie L, RPH-CPP  methocarbamol (ROBAXIN) 500 MG tablet Take 1 tablet (500 mg total) by mouth 2 (two) times daily. Patient not taking: Reported on 09/17/2018 12/28/17   Jacqlyn Larsen, PA-C    Family History Family History  Problem Relation Age of Onset  . Heart failure Mother   . Heart disease Mother   . Heart failure Father   . Arrhythmia Brother   . Colon cancer Neg Hx   . Esophageal cancer Neg Hx   . Rectal cancer Neg Hx   . Stomach cancer Neg Hx     Social History Social History   Tobacco Use  . Smoking status: Current Every Day Smoker  Packs/day: 0.50    Types: Cigarettes  . Smokeless tobacco: Never Used  . Tobacco comment: cutting back  Substance Use Topics  . Alcohol use: Yes    Alcohol/week: 5.0 - 6.0 standard drinks    Types: 5 - 6 Cans of beer per week    Comment: pt states daily beer drinker  . Drug use: Yes    Types: "Crack" cocaine    Comment: Quit 4 years ago     Allergies   Penicillins   Review of Systems Review of Systems  Cardiovascular: Negative for chest pain.  Musculoskeletal: Positive for myalgias and neck pain. Negative for arthralgias.  Neurological: Negative for dizziness, weakness, numbness and headaches.  All other systems reviewed and are negative.    Physical Exam Updated Vital  Signs BP (!) 150/90 (BP Location: Right Arm)   Pulse (!) 55   Temp 98.3 F (36.8 C) (Oral)   Resp 14   Ht 6\' 1"  (1.854 m)   Wt 83.9 kg   SpO2 99%   BMI 24.41 kg/m   Physical Exam Vitals signs and nursing note reviewed.  Constitutional:      General: He is not in acute distress.    Appearance: Normal appearance. He is well-developed.     Comments: Calm and cooperative. NAD  HENT:     Head: Normocephalic and atraumatic.  Eyes:     General: No scleral icterus.       Right eye: No discharge.        Left eye: No discharge.     Conjunctiva/sclera: Conjunctivae normal.     Pupils: Pupils are equal, round, and reactive to light.  Neck:     Musculoskeletal: Normal range of motion. Muscular tenderness (right cervical paraspinal muscle tenderness) present.     Comments: 5/5 upper extremity strength bilaterally. N/V intact Cardiovascular:     Rate and Rhythm: Normal rate.  Pulmonary:     Effort: Pulmonary effort is normal. No respiratory distress.  Abdominal:     General: There is no distension.  Musculoskeletal:        General: Tenderness (over right trapezius) present.  Skin:    General: Skin is warm and dry.  Neurological:     Mental Status: He is alert and oriented to person, place, and time.  Psychiatric:        Behavior: Behavior normal.      ED Treatments / Results  Labs (all labs ordered are listed, but only abnormal results are displayed) Labs Reviewed - No data to display  EKG None  Radiology No results found.  Procedures Procedures (including critical care time)  Medications Ordered in ED Medications  ibuprofen (ADVIL,MOTRIN) tablet 600 mg (600 mg Oral Given 09/17/18 1624)  methocarbamol (ROBAXIN) tablet 500 mg (500 mg Oral Given 09/17/18 1624)     Initial Impression / Assessment and Plan / ED Course  I have reviewed the triage vital signs and the nursing notes.  Pertinent labs & imaging results that were available during my care of the patient  were reviewed by me and considered in my medical decision making (see chart for details).  62 year old male presents with right sided neck and shoulder pain for 2 days. He is hypertensive but otherwise vitals are normal. Tenderness is reproducible on exam. He has normal upper extremity strength. He mostly is talking about his financial and social stressors. He was given Ibuprofen and Robaxin. He was advised to f/u with his doctor.  Final Clinical Impressions(s) /  ED Diagnoses   Final diagnoses:  Neck pain  Strain of right trapezius muscle, initial encounter    ED Discharge Orders    None       Recardo Evangelist, PA-C 09/17/18 1636    Quintella Reichert, MD 09/17/18 1729

## 2018-09-17 NOTE — ED Notes (Signed)
Pt alert and oriented in NAD. Pt verbalized understanding of discharge instructions. 

## 2018-09-17 NOTE — Discharge Instructions (Addendum)
Take Ibuprofen for pain Take Robaxin for muscle pain Use heat on the area and do stretching Please follow up with your doctor to have your blood pressure rechecked

## 2018-10-28 ENCOUNTER — Other Ambulatory Visit: Payer: Self-pay

## 2018-10-28 ENCOUNTER — Encounter (INDEPENDENT_AMBULATORY_CARE_PROVIDER_SITE_OTHER): Payer: Self-pay | Admitting: Family Medicine

## 2018-10-28 ENCOUNTER — Ambulatory Visit (INDEPENDENT_AMBULATORY_CARE_PROVIDER_SITE_OTHER): Payer: Medicare Other | Admitting: Family Medicine

## 2018-10-28 VITALS — BP 130/85 | HR 58 | Temp 97.8°F | Ht 73.0 in | Wt 181.0 lb

## 2018-10-28 DIAGNOSIS — M25461 Effusion, right knee: Secondary | ICD-10-CM | POA: Diagnosis not present

## 2018-10-28 DIAGNOSIS — M545 Low back pain, unspecified: Secondary | ICD-10-CM

## 2018-10-28 DIAGNOSIS — M25561 Pain in right knee: Secondary | ICD-10-CM | POA: Diagnosis not present

## 2018-10-28 MED ORDER — PREDNISONE 20 MG PO TABS: ORAL_TABLET | ORAL | 0 refills | Status: DC

## 2018-10-28 MED ORDER — MELOXICAM 7.5 MG PO TABS: ORAL_TABLET | ORAL | 0 refills | Status: DC

## 2018-10-28 NOTE — Patient Instructions (Signed)
Acute Back Pain, Adult Acute back pain is sudden and usually short-lived. It is often caused by an injury to the muscles and tissues in the back. The injury may result from:  A muscle or ligament getting overstretched or torn (strained). Ligaments are tissues that connect bones to each other. Lifting something improperly can cause a back strain.  Wear and tear (degeneration) of the spinal disks. Spinal disks are circular tissue that provides cushioning between the bones of the spine (vertebrae).  Twisting motions, such as while playing sports or doing yard work.  A hit to the back.  Arthritis. You may have a physical exam, lab tests, and imaging tests to find the cause of your pain. Acute back pain usually goes away with rest and home care. Follow these instructions at home: Managing pain, stiffness, and swelling  Take over-the-counter and prescription medicines only as told by your health care provider.  Your health care provider may recommend applying ice during the first 24-48 hours after your pain starts. To do this: ? Put ice in a plastic bag. ? Place a towel between your skin and the bag. ? Leave the ice on for 20 minutes, 2-3 times a day.  If directed, apply heat to the affected area as often as told by your health care provider. Use the heat source that your health care provider recommends, such as a moist heat pack or a heating pad. ? Place a towel between your skin and the heat source. ? Leave the heat on for 20-30 minutes. ? Remove the heat if your skin turns bright red. This is especially important if you are unable to feel pain, heat, or cold. You have a greater risk of getting burned. Activity   Do not stay in bed. Staying in bed for more than 1-2 days can delay your recovery.  Sit up and stand up straight. Avoid leaning forward when you sit, or hunching over when you stand. ? If you work at a desk, sit close to it so you do not need to lean over. Keep your chin tucked  in. Keep your neck drawn back, and keep your elbows bent at a right angle. Your arms should look like the letter "L." ? Sit high and close to the steering wheel when you drive. Add lower back (lumbar) support to your car seat, if needed.  Take short walks on even surfaces as soon as you are able. Try to increase the length of time you walk each day.  Do not sit, drive, or stand in one place for more than 30 minutes at a time. Sitting or standing for long periods of time can put stress on your back.  Do not drive or use heavy machinery while taking prescription pain medicine.  Use proper lifting techniques. When you bend and lift, use positions that put less stress on your back: ? Bend your knees. ? Keep the load close to your body. ? Avoid twisting.  Exercise regularly as told by your health care provider. Exercising helps your back heal faster and helps prevent back injuries by keeping muscles strong and flexible.  Work with a physical therapist to make a safe exercise program, as recommended by your health care provider. Do any exercises as told by your physical therapist. Lifestyle  Maintain a healthy weight. Extra weight puts stress on your back and makes it difficult to have good posture.  Avoid activities or situations that make you feel anxious or stressed. Stress and anxiety increase muscle   tension and can make back pain worse. Learn ways to manage anxiety and stress, such as through exercise. General instructions  Sleep on a firm mattress in a comfortable position. Try lying on your side with your knees slightly bent. If you lie on your back, put a pillow under your knees.  Follow your treatment plan as told by your health care provider. This may include: ? Cognitive or behavioral therapy. ? Acupuncture or massage therapy. ? Meditation or yoga. Contact a health care provider if:  You have pain that is not relieved with rest or medicine.  You have increasing pain going down  into your legs or buttocks.  Your pain does not improve after 2 weeks.  You have pain at night.  You lose weight without trying.  You have a fever or chills. Get help right away if:  You develop new bowel or bladder control problems.  You have unusual weakness or numbness in your arms or legs.  You develop nausea or vomiting.  You develop abdominal pain.  You feel faint. Summary  Acute back pain is sudden and usually short-lived.  Use proper lifting techniques. When you bend and lift, use positions that put less stress on your back.  Take over-the-counter and prescription medicines and apply heat or ice as directed by your health care provider. This information is not intended to replace advice given to you by your health care provider. Make sure you discuss any questions you have with your health care provider. Document Released: 09/10/2005 Document Revised: 04/17/2018 Document Reviewed: 04/24/2017 Elsevier Interactive Patient Education  2019 [employer].  Acute Knee Pain, Adult Many things can cause knee pain. Sometimes, knee pain is sudden (acute) and may be caused by damage, swelling, or irritation of the muscles and tissues that support your knee. The pain often goes away on its own with time and rest. If the pain does not go away, tests may be done to find out what is causing the pain. Follow these instructions at home: Pay attention to any changes in your symptoms. Take these actions to relieve your pain. If you have a knee sleeve or brace:   Wear the sleeve or brace as told by your doctor. Remove it only as told by your doctor.  Loosen the sleeve or brace if your toes: ? Tingle. ? Become numb. ? Turn cold and blue.  Keep the sleeve or brace clean.  If the sleeve or brace is not waterproof: ? Do not let it get wet. ? Cover it with a watertight covering when you take a bath or shower. Activity  Rest your knee.  Do not do things that cause pain.  Avoid  activities where both feet leave the ground at the same time (high-impact activities). Examples are running, jumping rope, and doing jumping jacks.  Work with a physical therapist to make a safe exercise program, as told by your doctor. Managing pain, stiffness, and swelling   If told, put ice on the knee: ? Put ice in a plastic bag. ? Place a towel between your skin and the bag. ? Leave the ice on for 20 minutes, 2-3 times a day.  If told, put pressure (compression) on your injured knee to control swelling, give support, and help with discomfort. Compression may be done with an elastic bandage. General instructions  Take all medicines only as told by your doctor.  Raise (elevate) your knee while you are sitting or lying down. Make sure your knee is higher than  your heart.  Sleep with a pillow under your knee.  Do not use any products that contain nicotine or tobacco. These include cigarettes, e-cigarettes, and chewing tobacco. These products may slow down healing. If you need help quitting, ask your doctor.  If you are overweight, work with your doctor and a food expert (dietitian) to set goals to lose weight. Being overweight can make your knee hurt more.  Keep all follow-up visits as told by your doctor. This is important. Contact a doctor if:  The knee pain does not stop.  The knee pain changes or gets worse.  You have a fever along with knee pain.  Your knee feels warm when you touch it.  Your knee gives out or locks up. Get help right away if:  Your knee swells, and the swelling gets worse.  You cannot move your knee.  You have very bad knee pain. Summary  Many things can cause knee pain. The pain often goes away on its own with time and rest.  Your doctor may do tests to find out the cause of the pain.  Pay attention to any changes in your symptoms. Relieve your pain with rest, medicines, light activity, and use of ice.  Get help right away if you cannot move  your knee or your knee pain is very bad. This information is not intended to replace advice given to you by your health care provider. Make sure you discuss any questions you have with your health care provider. Document Released: 12/07/2008 Document Revised: 02/20/2018 Document Reviewed: 02/20/2018 Elsevier Interactive Patient Education  2019 [employer].

## 2018-10-28 NOTE — Progress Notes (Signed)
Subjective:    Patient ID: Roy Martinez, male    DOB: [birth year], 63 y.o.   MRN: [id number]  HPI       63 yo male who is seen secondary to complaint of recent increase in pain in the right knee along with swelling.  Patient reports that he has had prior injury to both knees requiring surgery.  Patient states that the pain in his right knee is throbbing and pain is a 8 on a 0-to-10 scale.  Patient has also noticed some swelling of the right knee.  Patient also with complaint of pain in his left lower back.  Patient does not have any radiation of his back pain.  Patient states that his back pain is dull and stabbing and is a 10 on a 0-to-10 scale.  Patient reports that he has taken no medications for the back pain but was given Percocet in the past for his knee pain after surgery.  Patient states that the knee pain is constant and has been worse over the past 2 weeks.  Back pain sometimes lessens then gets worse and has been present for up to 4 weeks.  Patient denies any bowel or bladder dysfunction.  Past Medical History:  Diagnosis Date  . Chronic hepatitis C virus infection ([place])    under treatment June 2016  . H/O ETOH abuse   . Smoker    1/2 ppd   Past Surgical History:  Procedure Laterality Date  . KNEE ARTHROSCOPY    . KNEE ARTHROSCOPY WITH MENISCAL REPAIR Right 06/26/2016   Procedure: KNEE ARTHROSCOPY WITH MENISCAL REPAIR VS RESECTION;  Surgeon: Meredith Pel, MD;  Location: [facility];  Service: Orthopedics;  Laterality: Right;  . PATELLAR TENDON REPAIR Right 06/26/2016   Procedure: PATELLA TENDON REPAIR;  Surgeon: Meredith Pel, MD;  Location: [facility];  Service: Orthopedics;  Laterality: Right;   Family History  Problem Relation Age of Onset  . Heart failure Mother   . Heart disease Mother   . Heart failure Father   . Arrhythmia Brother   . Colon cancer Neg Hx   . Esophageal cancer Neg Hx   . Rectal cancer Neg Hx   . Stomach cancer Neg Hx    Social History   Tobacco  Use  . Smoking status: Current Every Day Smoker    Packs/day: 0.50    Types: Cigarettes  . Smokeless tobacco: Never Used  . Tobacco comment: cutting back  Substance Use Topics  . Alcohol use: Yes    Alcohol/week: 5.0 - 6.0 standard drinks    Types: 5 - 6 Cans of beer per week    Comment: pt states daily beer drinker  . Drug use: Yes    Types: "Crack" cocaine    Comment: Quit 4 years ago   Allergies  Allergen Reactions  . Penicillins Hives    Childhood allergy Has patient had a PCN reaction causing immediate rash, facial/tongue/throat swelling, SOB or lightheadedness with hypotension: No Has patient had a PCN reaction causing severe rash involving mucus membranes or skin necrosis: No Has patient had a PCN reaction that required hospitalization No Has patient had a PCN reaction occurring within the last 10 years: No If all of the above answers are "NO", then may proceed with Cephalosporin use.      Review of Systems  Constitutional: Negative for chills and fever.  HENT: Negative for sore throat and trouble swallowing.   Respiratory: Negative for cough and shortness of breath.  Cardiovascular: Negative for chest pain and palpitations.  Gastrointestinal: Negative for abdominal pain, blood in stool, constipation, diarrhea and nausea.  Endocrine: Negative for cold intolerance, heat intolerance, polydipsia, polyphagia and polyuria.  Genitourinary: Negative for dysuria and frequency.  Musculoskeletal: Positive for arthralgias, back pain, gait problem and joint swelling.  Neurological: Negative for dizziness and headaches.  Hematological: Negative for adenopathy. Does not bruise/bleed easily.       Objective:   Physical Exam BP 130/85 (BP Location: Right Arm, Patient Position: Sitting, Cuff Size: Normal)   Pulse (!) 58   Temp 97.8 F (36.6 C) (Oral)   Ht 6\' 1"  (1.854 m)   Wt 181 lb (82.1 kg)   SpO2 95%   BMI 23.88 kg/m  Nurse's notes and vital signs  reviewed General-well-nourished, well-developed male in no acute distress.  Patient however had difficulty trying to get up from a seated position therefore exam was done while patient sitting in the exam chair Lungs-clear to auscultation bilaterally Cardiovascular-regular rate and rhythm Abdomen-soft, nontender Back-patient with complaint of tenderness to palpation left mid to lateral low back.  No CVA tenderness Musculoskeletal- patient with visible prior healed surgical scars over the middle of both knees vertically.  Patient with visible edema/increased size of the right knee as compared to the left.  Patient with tenderness to palpation over the right upper/lateral knee and over the patella.  Patient with edema of the right upper lateral knee with mild sensation of increased warmth.  Patient with a high riding patella which could be postsurgical Psych- patient appears to have a slightly flattened affect as well as monotone speaking voice but seems to interact appropriately       Assessment & Plan:  1. Acute right-sided low back pain without sciatica Back pain is likely musculoskeletal.  Prescription provided for meloxicam which patient may take for both back and knee pain  2. Pain and swelling of right knee Patient reports prior surgery to the bilateral knees but has had recent onset of increased pain and swelling in the right knee.  Patient provided with a prednisone taper to help with the swelling and prescription for Mobic to take as needed for pain.  Patient should make sure to eat prior to taking both Mobic as well as prednisone to help avoid stomach upset.  Patient is being referred to orthopedics for further evaluation and treatment. - Ambulatory referral to Orthopedic Surgery  An After Visit Summary was printed and given to the patient.  Allergies as of 10/28/2018      Reactions   Penicillins Hives   Childhood allergy Has patient had a PCN reaction causing immediate rash,  facial/tongue/throat swelling, SOB or lightheadedness with hypotension: No Has patient had a PCN reaction causing severe rash involving mucus membranes or skin necrosis: No Has patient had a PCN reaction that required hospitalization No Has patient had a PCN reaction occurring within the last 10 years: No If all of the above answers are "NO", then may proceed with Cephalosporin use.      Medication List       Accurate as of October 28, 2018 11:59 PM. Always use your most recent med list.        meloxicam 7.5 MG tablet Commonly known as:  MOBIC 1 to 2 pills once per day as needed for pain.  Take after eating   predniSONE 20 MG tablet Commonly known as:  DELTASONE Take 2 pills today then 1 pill daily for 2 days then half pill x2  days.  Take after eating      Return if symptoms worsen or fail to improve, for back and knee pain.

## 2018-11-03 ENCOUNTER — Other Ambulatory Visit: Payer: Self-pay

## 2018-11-03 ENCOUNTER — Ambulatory Visit (HOSPITAL_COMMUNITY)
Admission: EM | Admit: 2018-11-03 | Discharge: 2018-11-03 | Disposition: A | Discharge status: Home / Self Care | Payer: Medicare Other | Attending: Family Medicine | Admitting: Family Medicine

## 2018-11-03 ENCOUNTER — Encounter (HOSPITAL_COMMUNITY): Payer: Self-pay | Admitting: Emergency Medicine

## 2018-11-03 DIAGNOSIS — K0889 Other specified disorders of teeth and supporting structures: Secondary | ICD-10-CM

## 2018-11-03 MED ORDER — MELOXICAM 7.5 MG PO TABS: 7.5000 mg | ORAL_TABLET | Freq: Every day | ORAL | 0 refills | Status: DC

## 2018-11-03 MED ORDER — CHLORHEXIDINE GLUCONATE 0.12 % MT SOLN: 15.0000 mL | Freq: Two times a day (BID) | OROMUCOSAL | 0 refills | Status: DC

## 2018-11-03 NOTE — ED Triage Notes (Signed)
Pt states he has tooth pain in his left upper jaw after eating steak at [place].  He thinks he may have bitten down on the bone and damaged his tooth.

## 2018-11-03 NOTE — Discharge Instructions (Addendum)
Part of the foreign body removed. Start peridex as directed. Mobic for pain and inflammation. Follow up with dentist for further evaluation needed.

## 2018-11-03 NOTE — ED Provider Notes (Signed)
[facility]    CSN: [id number] Arrival date & time: 11/03/18  1434     History   Chief Complaint Chief Complaint  Patient presents with  . Dental Pain    HPI Roy Martinez is a 63 y.o. male.   63 year old male comes in for left upper tooth pain after eating steak at [place]. States a piece of the steak is stuck between his teeth, causing pain. He has tried rinsing his mouth and using floss without relief. Denies gum swelling. No known cracked tooth/cavity prior to current symptoms onset.      Past Medical History:  Diagnosis Date  . Chronic hepatitis C virus infection ([place])    under treatment June 2016  . H/O ETOH abuse   . Smoker    1/2 ppd    Patient Active Problem List   Diagnosis Date Noted  . Smoker 03/28/2017  . Irregular heart beat 03/28/2017  . Abnormal EKG 03/28/2017  . ETOH abuse 03/28/2017  . Chronic hepatitis C without hepatic coma ([place]) 03/11/2017  . Patellar tendon rupture, right, initial encounter 06/26/2016    Past Surgical History:  Procedure Laterality Date  . KNEE ARTHROSCOPY    . KNEE ARTHROSCOPY WITH MENISCAL REPAIR Right 06/26/2016   Procedure: KNEE ARTHROSCOPY WITH MENISCAL REPAIR VS RESECTION;  Surgeon: Meredith Pel, MD;  Location: [facility];  Service: Orthopedics;  Laterality: Right;  . PATELLAR TENDON REPAIR Right 06/26/2016   Procedure: PATELLA TENDON REPAIR;  Surgeon: Meredith Pel, MD;  Location: [facility];  Service: Orthopedics;  Laterality: Right;       Home Medications    Prior to Admission medications   Medication Sig Start Date End Date Taking? Authorizing Provider  chlorhexidine (PERIDEX) 0.12 % solution Use as directed 15 mLs in the mouth or throat 2 (two) times daily. 11/03/18   Tasia Catchings, Dennies Coate V, PA-C  meloxicam (MOBIC) 7.5 MG tablet Take 1 tablet (7.5 mg total) by mouth daily. 11/03/18   Ok Edwards, PA-C    Family History Family History  Problem Relation Age of Onset  . Heart failure Mother   . Heart  disease Mother   . Heart failure Father   . Arrhythmia Brother   . Colon cancer Neg Hx   . Esophageal cancer Neg Hx   . Rectal cancer Neg Hx   . Stomach cancer Neg Hx     Social History Social History   Tobacco Use  . Smoking status: Current Every Day Smoker    Packs/day: 0.50    Types: Cigarettes  . Smokeless tobacco: Never Used  . Tobacco comment: cutting back  Substance Use Topics  . Alcohol use: Yes    Alcohol/week: 5.0 - 6.0 standard drinks    Types: 5 - 6 Cans of beer per week    Comment: pt states daily beer drinker  . Drug use: Yes    Types: "Crack" cocaine    Comment: Quit 4 years ago     Allergies   Penicillins   Review of Systems Review of Systems  Reason unable to perform ROS: See HPI as above.     Physical Exam Triage Vital Signs ED Triage Vitals [11/03/18 1612]  Enc Vitals Group     BP (!) 148/91     Pulse Rate (!) 53     Resp      Temp 97.9 F (36.6 C)     Temp Source Oral     SpO2 100 %  Weight      Height      Head Circumference      Peak Flow      Pain Score 8     Pain Loc      Pain Edu?      Excl. in [place]?    No data found.  Updated Vital Signs BP (!) 148/91 (BP Location: Right Arm)   Pulse (!) 53   Temp 97.9 F (36.6 C) (Oral)   SpO2 100%   Physical Exam Constitutional:      General: He is not in acute distress.    Appearance: He is well-developed. He is not diaphoretic.  HENT:     Head: Normocephalic and atraumatic.     Mouth/Throat:     Pharynx: Oropharynx is clear. Uvula midline.      Comments: No cracked tooth. Plaques seen around the gumline.  Eyes:     Conjunctiva/sclera: Conjunctivae normal.     Pupils: Pupils are equal, round, and reactive to light.  Neurological:     Mental Status: He is alert and oriented to person, place, and time.      UC Treatments / Results  Labs (all labs ordered are listed, but only abnormal results are displayed) Labs Reviewed - No data to  display  EKG None  Radiology No results found.  Procedures Procedures (including critical care time)  Medications Ordered in UC Medications - No data to display  Initial Impression / Assessment and Plan / UC Course  I have reviewed the triage vital signs and the nursing notes.  Pertinent labs & imaging results that were available during my care of the patient were reviewed by me and considered in my medical decision making (see chart for details).    Small piece of steak removed with hemostat/flossing. Patient still with pressure/foreign body sensation. No further foreign body seen. No improvement with flushing. Patient with increased pain with flossing, unable to reach between the teeth on the medial side. Patient would like to discontinue attempts to remove further foreign body. peridex as directed. Patient to follow up with dentist for further evaluation and management needed.   Final Clinical Impressions(s) / UC Diagnoses   Final diagnoses:  Pain, dental    ED Prescriptions    Medication Sig Dispense Auth. Provider   chlorhexidine (PERIDEX) 0.12 % solution Use as directed 15 mLs in the mouth or throat 2 (two) times daily. 120 mL Ottis Sarnowski V, PA-C   meloxicam (MOBIC) 7.5 MG tablet Take 1 tablet (7.5 mg total) by mouth daily. 15 tablet Tobin Chad, [place] 11/03/18 1655

## 2018-11-05 ENCOUNTER — Ambulatory Visit (INDEPENDENT_AMBULATORY_CARE_PROVIDER_SITE_OTHER): Payer: Medicare Other

## 2018-11-05 ENCOUNTER — Ambulatory Visit (INDEPENDENT_AMBULATORY_CARE_PROVIDER_SITE_OTHER): Payer: Medicare Other | Admitting: Orthopedic Surgery

## 2018-11-05 ENCOUNTER — Encounter (INDEPENDENT_AMBULATORY_CARE_PROVIDER_SITE_OTHER): Payer: Self-pay | Admitting: Orthopedic Surgery

## 2018-11-05 DIAGNOSIS — S86811S Strain of other muscle(s) and tendon(s) at lower leg level, right leg, sequela: Secondary | ICD-10-CM | POA: Diagnosis not present

## 2018-11-05 DIAGNOSIS — M25561 Pain in right knee: Secondary | ICD-10-CM | POA: Diagnosis not present

## 2018-11-05 DIAGNOSIS — M25461 Effusion, right knee: Secondary | ICD-10-CM

## 2018-11-05 MED ORDER — HYDROCODONE-ACETAMINOPHEN 5-325 MG PO TABS: 1.0000 | ORAL_TABLET | Freq: Every day | ORAL | 0 refills | Status: DC | PRN

## 2018-11-08 ENCOUNTER — Encounter (INDEPENDENT_AMBULATORY_CARE_PROVIDER_SITE_OTHER): Payer: Self-pay | Admitting: Orthopedic Surgery

## 2018-11-08 NOTE — Progress Notes (Signed)
Office Visit Note   Patient: Roy Martinez           Date of Birth: 1956-05-31           MRN: [id number] Visit Date: 11/05/2018 Requested by: Antony Blackbird, MD [place], [place] [address] PCP: Tawny Asal  Subjective: Chief Complaint  Patient presents with  . Right Knee - Follow-up, Pain    HPI: Roy Martinez is a 63 year old patient with right knee pain.  Denies any history of injury.  Had patellar tendon repair done about 3 years ago on the right-hand side.  Had chronic left knee rupture which went repaired and then it stretched out done elsewhere.  Denies any interval history of injury.  States that his right knee does give way at times.              ROS: All systems reviewed are negative as they relate to the chief complaint within the history of present illness.  Patient denies  fevers or chills.   Assessment & Plan: Visit Diagnoses:  1. Patellar tendon rupture, right, sequela   2. Pain and swelling of right knee     Plan: Impression is patella alto but not as much on the right knee as the left knee.  I think something has happened to this patella tendon rupture repair.  In general I think that he wants to try treat this with pain management for 1 to 2 weeks to see if that helps and then if not he may consider revision surgery.  One-time prescription for Norco provided which should not be refilled.  I will see him back as needed.  He does have functional extension in both knees with about a 20 degree lag on the left and 5 degree lag on the right  Follow-Up Instructions: No follow-ups on file.   Orders:  Orders Placed This Encounter  Procedures  . XR KNEE 3 VIEW RIGHT   Meds ordered this encounter  Medications  . HYDROcodone-acetaminophen (NORCO) 5-325 MG tablet    Sig: Take 1 tablet by mouth daily as needed for moderate pain.    Dispense:  25 tablet    Refill:  0      Procedures: No procedures performed   Clinical Data: No additional  findings.  Objective: Vital Signs: There were no vitals taken for this visit.  Physical Exam:   Constitutional: Patient appears well-developed HEENT:  Head: Normocephalic Eyes:EOM are normal Neck: Normal range of motion Cardiovascular: Normal rate Pulmonary/chest: Effort normal Neurologic: Patient is alert Skin: Skin is warm Psychiatric: Patient has normal mood and affect    Ortho Exam: Ortho exam demonstrates normal gait alignment.  No effusion in either knee.  No warmth to either knee.  Collateral crucial ligaments are stable.  Left knee has a 20 degree extensor lag right knee has about a 5 degree extensor lag.  Well-healed surgical incisions over both knees.  Specialty Comments:  No specialty comments available.  Imaging: No results found.   PMFS History: Patient Active Problem List   Diagnosis Date Noted  . Smoker 03/28/2017  . Irregular heart beat 03/28/2017  . Abnormal EKG 03/28/2017  . ETOH abuse 03/28/2017  . Chronic hepatitis C without hepatic coma ([place]) 03/11/2017  . Patellar tendon rupture, right, initial encounter 06/26/2016   Past Medical History:  Diagnosis Date  . Chronic hepatitis C virus infection ([employer])    under treatment June 2016  . H/O ETOH abuse   . Smoker  1/2 ppd    Family History  Problem Relation Age of Onset  . Heart failure Mother   . Heart disease Mother   . Heart failure Father   . Arrhythmia Brother   . Colon cancer Neg Hx   . Esophageal cancer Neg Hx   . Rectal cancer Neg Hx   . Stomach cancer Neg Hx     Past Surgical History:  Procedure Laterality Date  . KNEE ARTHROSCOPY    . KNEE ARTHROSCOPY WITH MENISCAL REPAIR Right 06/26/2016   Procedure: KNEE ARTHROSCOPY WITH MENISCAL REPAIR VS RESECTION;  Surgeon: Meredith Pel, MD;  Location: [facility];  Service: Orthopedics;  Laterality: Right;  . PATELLAR TENDON REPAIR Right 06/26/2016   Procedure: PATELLA TENDON REPAIR;  Surgeon: Meredith Pel, MD;  Location: [facility];   Service: Orthopedics;  Laterality: Right;   Social History   Occupational History  . Not on file  Tobacco Use  . Smoking status: Current Every Day Smoker    Packs/day: 0.50    Types: Cigarettes  . Smokeless tobacco: Never Used  . Tobacco comment: cutting back  Substance and Sexual Activity  . Alcohol use: Yes    Alcohol/week: 5.0 - 6.0 standard drinks    Types: 5 - 6 Cans of beer per week    Comment: pt states daily beer drinker  . Drug use: Yes    Types: "Crack" cocaine    Comment: Quit 4 years ago  . Sexual activity: Never

## 2018-11-18 ENCOUNTER — Ambulatory Visit (INDEPENDENT_AMBULATORY_CARE_PROVIDER_SITE_OTHER): Payer: Self-pay | Admitting: Primary Care

## 2018-12-06 ENCOUNTER — Emergency Department (HOSPITAL_COMMUNITY)
Admission: EM | Admit: 2018-12-06 | Discharge: 2018-12-07 | Disposition: A | Discharge status: Home / Self Care | Payer: Medicare Other | Attending: Emergency Medicine | Admitting: Emergency Medicine

## 2018-12-06 ENCOUNTER — Emergency Department (HOSPITAL_COMMUNITY): Payer: Medicare Other

## 2018-12-06 ENCOUNTER — Other Ambulatory Visit: Payer: Self-pay

## 2018-12-06 ENCOUNTER — Encounter (HOSPITAL_COMMUNITY): Payer: Self-pay

## 2018-12-06 DIAGNOSIS — M545 Low back pain, unspecified: Secondary | ICD-10-CM

## 2018-12-06 DIAGNOSIS — M5116 Intervertebral disc disorders with radiculopathy, lumbar region: Secondary | ICD-10-CM | POA: Diagnosis not present

## 2018-12-06 DIAGNOSIS — B1710 Acute hepatitis C without hepatic coma: Secondary | ICD-10-CM | POA: Insufficient documentation

## 2018-12-06 DIAGNOSIS — M5441 Lumbago with sciatica, right side: Secondary | ICD-10-CM | POA: Diagnosis not present

## 2018-12-06 DIAGNOSIS — M5442 Lumbago with sciatica, left side: Secondary | ICD-10-CM | POA: Diagnosis not present

## 2018-12-06 DIAGNOSIS — F17210 Nicotine dependence, cigarettes, uncomplicated: Secondary | ICD-10-CM | POA: Insufficient documentation

## 2018-12-06 MED ORDER — MELOXICAM 7.5 MG PO TABS: 7.5000 mg | ORAL_TABLET | Freq: Every day | ORAL | 0 refills | Status: AC

## 2018-12-06 MED ORDER — CYCLOBENZAPRINE HCL 10 MG PO TABS: 10.0000 mg | ORAL_TABLET | Freq: Three times a day (TID) | ORAL | 0 refills | Status: DC | PRN

## 2018-12-06 MED ORDER — KETOROLAC TROMETHAMINE 30 MG/ML IJ SOLN
30.0000 mg | Freq: Once | INTRAMUSCULAR | Status: AC
  Administered 2018-12-06: 30 mg via INTRAMUSCULAR
  Filled 2018-12-06: qty 1

## 2018-12-06 NOTE — ED Triage Notes (Signed)
Pt here with bilateral leg pain and soreness.  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 No falls or other injuries.

## 2018-12-06 NOTE — ED Notes (Signed)
ED Provider at bedside. 

## 2018-12-06 NOTE — Discharge Instructions (Signed)
Take mobic daily. You can take tylenol during the day but do not take aleve/ibuprofen/motrin while taking mobic. Apply heat therapy to back (heating pad, warm bath). Lift objects with legs instead of bending over. Apply lidocaine patch to back which you can by at pharmacy/drug store. RETURN TO ER IF YOU HAVE ANY LEG WEAKNESS, BOWEL/BLADDER PROBLEMS, OR PROBLEMS WALKING.

## 2018-12-06 NOTE — ED Notes (Signed)
Patient transported to X-ray 

## 2018-12-06 NOTE — ED Provider Notes (Signed)
Roy Martinez Provider Note   CSN: [id number] Arrival date & time: 12/06/18  2040    History   Chief Complaint Chief Complaint  Patient presents with  . Leg Pain    HPI Roy Martinez is a 63 y.o. male.     63 year old male with past medical history including hepatitis C who presents with back and bilateral leg pain.  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  For the past week, he has been having persistent pain in his right lower back as well as pain in bilateral legs that is worse with movements, sitting, and trying to get up.  No fall or injury.  No weakness but he does state that it is hard to walk because of the pain.  No urinary problems or incontinence.  No fevers or recent illness.  He denies any IV drug use or history of cancer.  He has not tried any medications for his symptoms.  The history is provided by the patient.  Leg Pain    Past Medical History:  Diagnosis Date  . Chronic hepatitis C virus infection ([facility])    under treatment June 2016  . H/O ETOH abuse   . Smoker    1/2 ppd    Patient Active Problem List   Diagnosis Date Noted  . Smoker 03/28/2017  . Irregular heart beat 03/28/2017  . Abnormal EKG 03/28/2017  . ETOH abuse 03/28/2017  . Chronic hepatitis C without hepatic coma ([place]) 03/11/2017  . Patellar tendon rupture, right, initial encounter 06/26/2016    Past Surgical History:  Procedure Laterality Date  . KNEE ARTHROSCOPY    . KNEE ARTHROSCOPY WITH MENISCAL REPAIR Right 06/26/2016   Procedure: KNEE ARTHROSCOPY WITH MENISCAL REPAIR VS RESECTION;  Surgeon: Meredith Pel, MD;  Location: [facility];  Service: Orthopedics;  Laterality: Right;  . PATELLAR TENDON REPAIR Right 06/26/2016   Procedure: PATELLA TENDON REPAIR;  Surgeon: Meredith Pel, MD;  Location: [facility];  Service: Orthopedics;  Laterality: Right;        Home Medications    Prior to Admission  medications   Medication Sig Start Date End Date Taking? Authorizing Provider  chlorhexidine (PERIDEX) 0.12 % solution Use as directed 15 mLs in the mouth or throat 2 (two) times daily. Patient not taking: Reported on 11/05/2018 11/03/18   Ok Edwards, PA-C  cyclobenzaprine (FLEXERIL) 10 MG tablet Take 1 tablet (10 mg total) by mouth 3 (three) times daily as needed for muscle spasms. 12/06/18   Little, Wenda Overland, MD  HYDROcodone-acetaminophen (NORCO) 5-325 MG tablet Take 1 tablet by mouth daily as needed for moderate pain. 11/05/18   Meredith Pel, MD  meloxicam (MOBIC) 7.5 MG tablet Take 1 tablet (7.5 mg total) by mouth daily for 7 days. 12/06/18 12/13/18  Little, Wenda Overland, MD    Family History Family History  Problem Relation Age of Onset  . Heart failure Mother   . Heart disease Mother   . Heart failure Father   . Arrhythmia Brother   . Colon cancer Neg Hx   . Esophageal cancer Neg Hx   . Rectal cancer Neg Hx   . Stomach cancer Neg Hx     Social History Social History   Tobacco Use  . Smoking status: Current Every Day Smoker    Packs/day: 0.50    Types: Cigarettes  . Smokeless tobacco: Never Used  . Tobacco comment: cutting back  Substance Use Topics  . Alcohol use: Yes    Alcohol/week: 5.0 - 6.0 standard drinks    Types: 5 - 6 Cans of beer per week    Comment: pt states daily beer drinker  . Drug use: Yes    Types: "Crack" cocaine    Comment: Quit 4 years ago     Allergies   Penicillins   Review of Systems Review of Systems All other systems reviewed and are negative except that which was mentioned in HPI   Physical Exam Updated Vital Signs BP 136/84   Pulse (!) 58   Temp 98.5 F (36.9 C) (Oral)   Resp 17   SpO2 97%   Physical Exam Vitals signs and nursing note reviewed.  Constitutional:      General: He is not in acute distress.    Appearance: He is well-developed.  HENT:     Head: Normocephalic and atraumatic.     Mouth/Throat:      Mouth: Mucous membranes are moist.  Eyes:     Conjunctiva/sclera: Conjunctivae normal.  Neck:     Musculoskeletal: Neck supple.  Cardiovascular:     Rate and Rhythm: Normal rate and regular rhythm.     Heart sounds: Normal heart sounds. No murmur.  Pulmonary:     Effort: Pulmonary effort is normal.     Breath sounds: Normal breath sounds.  Abdominal:     General: Bowel sounds are normal. There is no distension.     Palpations: Abdomen is soft.     Tenderness: There is no abdominal tenderness.  Musculoskeletal: Normal range of motion.     Comments: Scars over b/l knees, R knee with joint effusion; mild tenderness R lateral lumbar paraspinal muscles, no midline spinal tenderness; no focal leg tenderness  Skin:    General: Skin is warm and dry.  Neurological:     Mental Status: He is alert and oriented to person, place, and time.     Sensory: No sensory deficit.     Motor: No weakness.     Comments: Fluent speech  Psychiatric:        Judgment: Judgment normal.      ED Treatments / Results  Labs (all labs ordered are listed, but only abnormal results are displayed) Labs Reviewed - No data to display  EKG None  Radiology Dg Lumbar Spine 2-3 Views  Result Date: 12/06/2018 CLINICAL DATA:  Worsening low back and bilateral lower leg pain over the past few days. EXAM: LUMBAR SPINE - 2-3 VIEW COMPARISON:  Abdomen and pelvis CT dated 12/28/2017. FINDINGS: Five non-rib-bearing lumbar vertebrae. Mild anterior spur formation at the L3-4 level, mild to moderate anterior spur formation at the L4-5 level and moderate to marked anterior spur formation at the L5-S1 level. Vacuum phenomena in the L4-5 and L5-S1 discs. No fractures, pars defects or subluxations. Mild lower thoracic spine degenerative changes. IMPRESSION: Degenerative changes, as described above. Electronically Signed   By: Claudie Revering M.D.   On: 12/06/2018 22:48    Procedures Procedures (including critical care time)   Medications Ordered in ED Medications  ketorolac (TORADOL) 30 MG/ML injection 30 mg (30 mg Intramuscular Given 12/06/18 2256)     Initial Impression / Assessment and Plan / ED Course  I have reviewed the triage vital signs and the nursing notes.  Pertinent labs & imaging results that were available during my care of the patient were reviewed by me and considered in my medical decision making (see chart for details).  Neuro intact, no infectious symptoms. No change in physical activity or rigorous physical activity to suggest rhabdomyolysis.  No symptoms concerning for cauda equina.  Because of his age over 75, obtained plain films of lumbar spine.XR no degenerative changes with no acute findings.  His leg pain does seem to be related to his back pain and I suspect he may be having radiculopathy.  I discussed supportive measures including a short course of NSAIDs, Tylenol, lidocaine patches, muscle relaxants, and heat therapy.  Instructed to follow-up with PCP for reassessment and extensively reviewed return precautions.  He voiced understanding.  Final Clinical Impressions(s) / ED Diagnoses   Final diagnoses:  Low back pain  Acute right-sided low back pain with bilateral sciatica    ED Discharge Orders         Ordered    cyclobenzaprine (FLEXERIL) 10 MG tablet  3 times daily PRN     12/06/18 2350    meloxicam (MOBIC) 7.5 MG tablet  Daily     12/06/18 2350           Little, Wenda Overland, MD 12/06/18 2352

## 2018-12-07 NOTE — ED Notes (Signed)
Reviewed d/c instructions with pt, who verbalized understanding and had no outstanding questions. Pt armband & labels removed and placed in shred bin. Pt departed in NAD, refused use of wheelchair.

## 2018-12-26 ENCOUNTER — Emergency Department (HOSPITAL_COMMUNITY)
Admission: EM | Admit: 2018-12-26 | Discharge: 2018-12-26 | Disposition: A | Discharge status: Home / Self Care | Payer: Medicare Other | Attending: Emergency Medicine | Admitting: Emergency Medicine

## 2018-12-26 ENCOUNTER — Other Ambulatory Visit: Payer: Self-pay

## 2018-12-26 DIAGNOSIS — R519 Headache, unspecified: Secondary | ICD-10-CM

## 2018-12-26 DIAGNOSIS — F17210 Nicotine dependence, cigarettes, uncomplicated: Secondary | ICD-10-CM | POA: Diagnosis not present

## 2018-12-26 DIAGNOSIS — R51 Headache: Secondary | ICD-10-CM | POA: Diagnosis not present

## 2018-12-26 MED ORDER — ACETAMINOPHEN 325 MG PO TABS
650.0000 mg | ORAL_TABLET | Freq: Once | ORAL | Status: AC
Start: 2018-12-26 — End: 2018-12-26
  Administered 2018-12-26: 650 mg via ORAL
  Filled 2018-12-26: qty 2

## 2018-12-26 NOTE — ED Provider Notes (Signed)
[facility] EMERGENCY DEPARTMENT Provider Note   CSN: [id number] Arrival date & time: 12/26/18  1637    History   Chief Complaint Chief Complaint  Patient presents with  . Headache    HPI Roy Martinez is a 63 y.o. male with history of alcohol and tobacco abuse presenting to emergency department today with chief complaint of pain in his head x2 days.  Patient states he has pain behind his right ear that he describes as feeling sore.  The pain does not radiate.  It has been intermittent and he rates 5 out of 10 in severity.  He took aspirin just prior to arrival, unable to tell if they helped improved the pain.   He denies any fever, visual changes, neck pain, chest pain, shortness of breath, nausea, vomiting, back pain, weakness, numbness, speech difficulty.     He states he has been power washing multiple driveways over the last week stating he washed 3 driveways yesterday and afterwards the pain started.  Patient has also used a heating pad on his neck which he states helped his symptoms.  History provided by patient.     Past Medical History:  Diagnosis Date  . Chronic hepatitis C virus infection ([place])    under treatment June 2016  . H/O ETOH abuse   . Smoker    1/2 ppd    Patient Active Problem List   Diagnosis Date Noted  . Smoker 03/28/2017  . Irregular heart beat 03/28/2017  . Abnormal EKG 03/28/2017  . ETOH abuse 03/28/2017  . Chronic hepatitis C without hepatic coma ([place]) 03/11/2017  . Patellar tendon rupture, right, initial encounter 06/26/2016    Past Surgical History:  Procedure Laterality Date  . KNEE ARTHROSCOPY    . KNEE ARTHROSCOPY WITH MENISCAL REPAIR Right 06/26/2016   Procedure: KNEE ARTHROSCOPY WITH MENISCAL REPAIR VS RESECTION;  Surgeon: Meredith Pel, MD;  Location: [facility];  Service: Orthopedics;  Laterality: Right;  . PATELLAR TENDON REPAIR Right 06/26/2016   Procedure: PATELLA TENDON REPAIR;  Surgeon: Meredith Pel, MD;   Location: [facility];  Service: Orthopedics;  Laterality: Right;        Home Medications    Prior to Admission medications   Medication Sig Start Date End Date Taking? Authorizing Provider  chlorhexidine (PERIDEX) 0.12 % solution Use as directed 15 mLs in the mouth or throat 2 (two) times daily. Patient not taking: Reported on 11/05/2018 11/03/18   Ok Edwards, PA-C  cyclobenzaprine (FLEXERIL) 10 MG tablet Take 1 tablet (10 mg total) by mouth 3 (three) times daily as needed for muscle spasms. 12/06/18   Little, Wenda Overland, MD  HYDROcodone-acetaminophen (NORCO) 5-325 MG tablet Take 1 tablet by mouth daily as needed for moderate pain. 11/05/18   Meredith Pel, MD    Family History Family History  Problem Relation Age of Onset  . Heart failure Mother   . Heart disease Mother   . Heart failure Father   . Arrhythmia Brother   . Colon cancer Neg Hx   . Esophageal cancer Neg Hx   . Rectal cancer Neg Hx   . Stomach cancer Neg Hx     Social History Social History   Tobacco Use  . Smoking status: Current Every Day Smoker    Packs/day: 0.50    Types: Cigarettes  . Smokeless tobacco: Never Used  . Tobacco comment: cutting back  Substance Use Topics  . Alcohol use: Yes    Alcohol/week: 5.0 -  6.0 standard drinks    Types: 5 - 6 Cans of beer per week    Comment: pt states daily beer drinker  . Drug use: Yes    Types: "Crack" cocaine    Comment: Quit 4 years ago     Allergies   Penicillins   Review of Systems Review of Systems  Constitutional: Negative for chills and fever.  HENT: Negative for congestion, rhinorrhea, sinus pressure and sore throat.   Eyes: Negative for pain and redness.  Respiratory: Negative for cough, shortness of breath and wheezing.   Cardiovascular: Negative for chest pain and palpitations.  Gastrointestinal: Negative for abdominal pain, constipation, diarrhea, nausea and vomiting.  Genitourinary: Negative for dysuria.  Musculoskeletal: Negative for  arthralgias, back pain, myalgias and neck pain.  Skin: Negative for rash and wound.  Neurological: Negative for dizziness, syncope, weakness, numbness and headaches.  Psychiatric/Behavioral: Negative for confusion.     Physical Exam Updated Vital Signs BP (!) 140/92 (BP Location: Right Arm)   Pulse 69   Temp 98.7 F (37.1 C) (Oral)   Resp 17   SpO2 96%   Physical Exam Vitals signs and nursing note reviewed.  Constitutional:      Appearance: He is well-developed. He is not ill-appearing or toxic-appearing.  HENT:     Head: Normocephalic and atraumatic.     Comments: Head is nontender to palpation.  There is no wounds, lacerations, ecchymosis, bleeding noted. No sinus or temporal tenderness. No tenderness, no inflammation, no erythema over the mastoid process bilaterally.     Right Ear: Tympanic membrane and external ear normal. No decreased hearing noted. No drainage, swelling or tenderness. No middle ear effusion. Tympanic membrane is not erythematous or bulging. Tympanic membrane has normal mobility.     Left Ear: Tympanic membrane and external ear normal. No decreased hearing noted. No drainage, swelling or tenderness.  No middle ear effusion. Tympanic membrane is not erythematous or bulging. Tympanic membrane has normal mobility.     Nose: Nose normal.     Mouth/Throat:     Mouth: Mucous membranes are moist.     Pharynx: Oropharynx is clear.  Eyes:     General: No scleral icterus.       Right eye: No discharge.        Left eye: No discharge.     Extraocular Movements: Extraocular movements intact.     Conjunctiva/sclera: Conjunctivae normal.     Pupils: Pupils are equal, round, and reactive to light.  Neck:     Musculoskeletal: Neck supple. No edema, erythema, crepitus, pain with movement or muscular tenderness.     Comments: Full ROM intact without spinous process TTP. No bony stepoffs or deformities, No paraspinous muscle TTP or muscle spasms. No rigidity or meningeal  signs. No bruising, erythema, or swelling.  Cardiovascular:     Rate and Rhythm: Normal rate and regular rhythm.     Pulses: Normal pulses.     Heart sounds: Normal heart sounds.  Pulmonary:     Effort: Pulmonary effort is normal.     Breath sounds: Normal breath sounds.  Abdominal:     General: There is no distension.     Palpations: Abdomen is soft.  Skin:    General: Skin is warm and dry.     Capillary Refill: Capillary refill takes less than 2 seconds.  Neurological:     Mental Status: He is oriented to person, place, and time.     Comments: Mental Status:  Alert, oriented, thought  content appropriate, able to give a coherent history. Speech fluent without evidence of aphasia. Able to follow 2 step commands without difficulty.  Cranial Nerves:  II:  Peripheral visual fields grossly normal, pupils equal, round, reactive to light III,IV, VI: ptosis not present, extra-ocular motions intact bilaterally  V,VII: smile symmetric, facial light touch sensation equal VIII: hearing grossly normal to voice  X: uvula elevates symmetrically  XI: bilateral shoulder shrug symmetric and strong XII: midline tongue extension without fassiculations Motor:  Normal tone. 5/5 in upper and lower extremities bilaterally including strong and equal grip strength and dorsiflexion/plantar flexion Sensory: Pinprick and light touch normal in all extremities.  Deep Tendon Reflexes: 2+ and symmetric in the biceps and patella Cerebellar: normal finger-to-nose with bilateral upper extremities Gait: normal gait and balance CV: distal pulses palpable throughout   Psychiatric:        Behavior: Behavior normal.      ED Treatments / Results  Labs (all labs ordered are listed, but only abnormal results are displayed) Labs Reviewed - No data to display  EKG None  Radiology No results found.  Procedures Procedures (including critical care time)  Medications Ordered in ED Medications  acetaminophen  (TYLENOL) tablet 650 mg (has no administration in time range)     Initial Impression / Assessment and Plan / ED Course  I have reviewed the triage vital signs and the nursing notes.  Pertinent labs & imaging results that were available during my care of the patient were reviewed by me and considered in my medical decision making (see chart for details).    Pt is well appearing, afebrile, in no acute distress. DDX includes headache, muscular strain, otitis media, mastoiditis. On exam pain is not reproducible. Neck is non tender with full ROM. Bilateral ears are unremarkable and hearing is intact. Neuro exam is without focal deficits, nuchal rigidity, changes in vision.  Given pt's overall well appearance and lack of exam findings will give Tylenol and reassess.   Pt pain treated with PO Tylenol and improved while in ED.  Pt verbalizes understanding and is agreeable with plan to dc. Discussed with pt the need to follow up with pcp within 1 week. Strict ED return precautions discussed including if pain persists, he has fever, tenderness or inflammation over mastoid process he will need to be reevaluated.   Patient's blood pressure elevated in the emergency department today. Patient denies change in vision, numbness, weakness, chest pain, dyspnea, dizziness, or lightheadedness therefore doubt hypertensive emergency. Discussed elevated blood pressure with the patient and the need for primary care follow up with potential need to initiate or change antihypertensive medications and/or for further evaluation. Discussed return precaution signs/symptoms for hypertensive emergency as listed above with the patient. He confirmed understanding.     Patient is hemodynamically stable, in NAD, and able to ambulate in the ED. Evaluation does not show pathology that would require ongoing emergent intervention or inpatient treatment. I explained the diagnosis to the patient.  Patient is comfortable with above plan and  is stable for discharge at this time. All questions were answered prior to disposition. Pt case discussed with Dr. Tyrone Nine who agrees with my plan.    This note was prepared with assistance of Systems analyst. Occasional wrong-word or sound-a-like substitutions may have occurred due to the inherent limitations of voice recognition software.   Final Clinical Impressions(s) / ED Diagnoses   Final diagnoses:  None    ED Discharge Orders    None  Cherre Robins, PA-C 12/27/18 [employer], [place], DO 12/27/18 1513

## 2018-12-26 NOTE — ED Triage Notes (Signed)
Pt here for evaluation of headache/neck pain onset yesterday. No other neuro deficits.

## 2018-12-26 NOTE — Discharge Instructions (Addendum)
You have been seen today for pain in your head. Please read and follow all provided instructions. Return to the emergency room for worsening condition or new concerning symptoms.    1. Medications:  Prescription- please take tylenol and ibuprofen for pain. Continue to use heat and ice to your neck if that helps with the pain.  Your blood pressure was elevated today.  Please follow-up with your primary care doctor to have this rechecked and discuss further management as needed.  Continue usual home medications Take medications as prescribed. Please review all of the medicines and only take them if you do not have an allergy to them.   2. Treatment: rest, drink plenty of fluids 3. Follow Up: Please follow up with your primary doctor in 2-5 days for discussion of your diagnoses and further evaluation after today's visit; Call today to arrange your follow up.    It is also a possibility that you have an allergic reaction to any of the medicines that you have been prescribed - Everybody reacts differently to medications and while MOST people have no trouble with most medicines, you may have a reaction such as nausea, vomiting, rash, swelling, shortness of breath. If this is the case, please stop taking the medicine immediately and contact your physician.  ?

## 2019-01-27 ENCOUNTER — Encounter (INDEPENDENT_AMBULATORY_CARE_PROVIDER_SITE_OTHER): Payer: Self-pay | Admitting: Family Medicine

## 2019-05-26 ENCOUNTER — Other Ambulatory Visit: Payer: Self-pay

## 2019-05-26 ENCOUNTER — Ambulatory Visit (INDEPENDENT_AMBULATORY_CARE_PROVIDER_SITE_OTHER): Payer: Medicare Other | Admitting: Primary Care

## 2019-05-26 ENCOUNTER — Encounter (INDEPENDENT_AMBULATORY_CARE_PROVIDER_SITE_OTHER): Payer: Self-pay | Admitting: Primary Care

## 2019-05-26 VITALS — BP 143/86 | HR 46 | Temp 97.5°F | Ht 73.0 in | Wt 179.0 lb

## 2019-05-26 DIAGNOSIS — F17210 Nicotine dependence, cigarettes, uncomplicated: Secondary | ICD-10-CM | POA: Diagnosis not present

## 2019-05-26 DIAGNOSIS — N529 Male erectile dysfunction, unspecified: Secondary | ICD-10-CM

## 2019-05-26 DIAGNOSIS — B182 Chronic viral hepatitis C: Secondary | ICD-10-CM | POA: Diagnosis not present

## 2019-05-26 DIAGNOSIS — M545 Low back pain: Secondary | ICD-10-CM | POA: Diagnosis not present

## 2019-05-26 DIAGNOSIS — I10 Essential (primary) hypertension: Secondary | ICD-10-CM

## 2019-05-26 DIAGNOSIS — M25561 Pain in right knee: Secondary | ICD-10-CM | POA: Diagnosis not present

## 2019-05-26 DIAGNOSIS — M25461 Effusion, right knee: Secondary | ICD-10-CM | POA: Diagnosis not present

## 2019-05-26 DIAGNOSIS — F17200 Nicotine dependence, unspecified, uncomplicated: Secondary | ICD-10-CM

## 2019-05-26 MED ORDER — HYDROCHLOROTHIAZIDE 25 MG PO TABS: 25.0000 mg | ORAL_TABLET | Freq: Every day | ORAL | 3 refills | Status: DC

## 2019-05-26 NOTE — Progress Notes (Signed)
Established Patient Office Visit  Subjective:  Patient ID: Roy Martinez, male    DOB: 1955/12/08  Age: 63 y.o. MRN: [id number]  CC:  Chief Complaint  Patient presents with  . Establish Care    right knee pain    HPI Culver Drain presents for bilateral knee pain and right knee is swollen rates his pain 8/10. 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 Concern about erectile dysfunction. "cant get up anymore)  Past Medical History:  Diagnosis Date  . Chronic hepatitis C virus infection ([place])    under treatment June 2016  . H/O ETOH abuse   . Smoker    1/2 ppd    Past Surgical History:  Procedure Laterality Date  . KNEE ARTHROSCOPY    . KNEE ARTHROSCOPY WITH MENISCAL REPAIR Right 06/26/2016   Procedure: KNEE ARTHROSCOPY WITH MENISCAL REPAIR VS RESECTION;  Surgeon: Meredith Pel, MD;  Location: [facility];  Service: Orthopedics;  Laterality: Right;  . PATELLAR TENDON REPAIR Right 06/26/2016   Procedure: PATELLA TENDON REPAIR;  Surgeon: Meredith Pel, MD;  Location: [facility];  Service: Orthopedics;  Laterality: Right;    Family History  Problem Relation Age of Onset  . Heart failure Mother   . Heart disease Mother   . Heart failure Father   . Arrhythmia Brother   . Colon cancer Neg Hx   . Esophageal cancer Neg Hx   . Rectal cancer Neg Hx   . Stomach cancer Neg Hx     Social History   Socioeconomic History  . Marital status: Single    Spouse name: Not on file  . Number of children: Not on file  . Years of education: Not on file  . Highest education level: Not on file  Occupational History  . Not on file  Social Needs  . Financial resource strain: Not on file  . Food insecurity    Worry: Not on file    Inability: Not on file  . Transportation needs    Medical: Not on file    Non-medical: Not on file  Tobacco Use  . Smoking status: Current Every Day Smoker    Packs/day: 0.50    Types: Cigarettes  .  Smokeless tobacco: Never Used  . Tobacco comment: cutting back  Substance and Sexual Activity  . Alcohol use: Yes    Alcohol/week: 5.0 - 6.0 standard drinks    Types: 5 - 6 Cans of beer per week    Comment: pt states daily beer drinker  . Drug use: Yes    Types: "Crack" cocaine    Comment: Quit 4 years ago  . Sexual activity: Never  Lifestyle  . Physical activity    Days per week: Not on file    Minutes per session: Not on file  . Stress: Not on file  Relationships  . Social Herbalist on phone: Not on file    Gets together: Not on file    Attends religious service: Not on file    Active member of club or organization: Not on file    Attends meetings of clubs or organizations: Not on file    Relationship status: Not on file  . Intimate partner violence    Fear of current or ex partner: Not on file    Emotionally abused: Not on file    Physically abused: Not on file    Forced sexual activity: Not on file  Other Topics Concern  . Not on file  Social History Narrative   ** Merged History Encounter **        Outpatient Medications Prior to Visit  Medication Sig Dispense Refill  . chlorhexidine (PERIDEX) 0.12 % solution Use as directed 15 mLs in the mouth or throat 2 (two) times daily. (Patient not taking: Reported on 11/05/2018) 120 mL 0  . cyclobenzaprine (FLEXERIL) 10 MG tablet Take 1 tablet (10 mg total) by mouth 3 (three) times daily as needed for muscle spasms. 8 tablet 0  . HYDROcodone-acetaminophen (NORCO) 5-325 MG tablet Take 1 tablet by mouth daily as needed for moderate pain. 25 tablet 0   No facility-administered medications prior to visit.     Allergies  Allergen Reactions  . Penicillins Hives    Childhood allergy Has patient had a PCN reaction causing immediate rash, facial/tongue/throat swelling, SOB or lightheadedness with hypotension: No Has patient had a PCN reaction causing severe rash involving mucus membranes or skin necrosis: No Has patient  had a PCN reaction that required hospitalization No Has patient had a PCN reaction occurring within the last 10 years: No If all of the above answers are "NO", then may proceed with Cephalosporin use.     ROS Review of Systems  Musculoskeletal: Positive for arthralgias, back pain and joint swelling.  All other systems reviewed and are negative.     Objective:    Physical Exam  Constitutional: He is oriented to person, place, and time. He appears well-developed and well-nourished.  Thin built   HENT:  Head: Normocephalic.  Neck: Normal range of motion.  Cardiovascular: Normal rate and regular rhythm.  Pulmonary/Chest: Effort normal and breath sounds normal.  Abdominal: Soft. Bowel sounds are normal.  Musculoskeletal: Normal range of motion.  Neurological: He is alert and oriented to person, place, and time.  Skin: Skin is warm and dry.    BP (!) 143/86 (BP Location: Left Arm, Patient Position: Sitting, Cuff Size: Normal)   Pulse (!) 46   Temp (!) 97.5 F (36.4 C) (Tympanic)   Ht 6\' 1"  (1.854 m)   Wt 179 lb (81.2 kg)   SpO2 98%   BMI 23.62 kg/m  Wt Readings from Last 3 Encounters:  05/26/19 179 lb (81.2 kg)  10/28/18 181 lb (82.1 kg)  09/17/18 185 lb (83.9 kg)     Health Maintenance Due  Topic Date Due  . INFLUENZA VACCINE  04/25/2019    There are no preventive care reminders to display for this patient.  Lab Results  Component Value Date   TSH 0.485 03/28/2017   Lab Results  Component Value Date   WBC 6.6 12/28/2017   HGB 14.0 12/28/2017   HCT 43.0 12/28/2017   MCV 94.5 12/28/2017   PLT 331 12/28/2017   Lab Results  Component Value Date   NA 137 12/28/2017   K 3.6 12/28/2017   CO2 22 12/28/2017   GLUCOSE 113 (H) 12/28/2017   BUN 14 12/28/2017   CREATININE 0.67 12/28/2017   BILITOT 0.9 12/28/2017   ALKPHOS 55 12/28/2017   AST 27 12/28/2017   ALT 23 12/28/2017   PROT 7.2 12/28/2017   ALBUMIN 3.7 12/28/2017   CALCIUM 9.0 12/28/2017   ANIONGAP  11 12/28/2017   Lab Results  Component Value Date   CHOL 172 02/14/2017   Lab Results  Component Value Date   HDL 90 02/14/2017   Lab Results  Component Value Date   LDLCALC 66 02/14/2017   Lab Results  Component Value Date   TRIG 82 02/14/2017   Lab Results  Component Value Date   CHOLHDL 1.9 02/14/2017   No results found for: HGBA1C    Assessment & Plan:  Devone was seen today for establish care.  Diagnoses and all orders for this visit:  Pain and swelling of right knee Work on losing weight to help reduce joint pain. May alternate with heat and ice application for pain relief. May also alternate with acetaminophen and Ibuprofen as prescribed pain relief. Other alternatives include massage, acupuncture and water aerobics.  You must stay active and avoid a sedentary lifestyle. -     Ambulatory referral to Orthopedic Surgery  Smoker Nicotine affect every organ in the body second leading cause of death.  Increased risk for lung cancer and other respiratory diseases recommend cessation.  This will be reminded at each clinical visit.  Chronic hepatitis C without hepatic coma ([facility]) Lab Results  Component Value Date   HCVAB >11.0 (H) 01/25/2017    Erectile dysfunction, unspecified erectile dysfunction type Erectile dysfunction (ED) is the inability to get or keep an erection firm enough to have sexual intercourse. Deferring prescribing medication until Bp is better controlled.  Essential hypertension Counseled on blood pressure goal of less than 130/80, low-sodium, DASH diet, medication compliance, 150 minutes of moderate intensity exercise per week. Discussed medication compliance, adverse effects.  Other orders -     hydrochlorothiazide (HYDRODIURIL) 25 MG tablet; Take 1 tablet (25 mg total) by mouth daily.    Problem List Items Addressed This Visit    Meds ordered this encounter  Medications  . hydrochlorothiazide (HYDRODIURIL) 25 MG tablet    Sig: Take 1  tablet (25 mg total) by mouth daily.    Dispense:  90 tablet    Refill:  3    Follow-up: Return in about 4 weeks (around 06/23/2019) for Bp check .    Kerin Perna, NP

## 2019-05-26 NOTE — Progress Notes (Signed)
Right knee pain Back pain

## 2019-05-26 NOTE — Patient Instructions (Signed)
Erectile Dysfunction Erectile dysfunction (ED) is the inability to get or keep an erection in order to have sexual intercourse. Erectile dysfunction may include:  Inability to get an erection.  Lack of enough hardness of the erection to allow penetration.  Loss of the erection before sex is finished. What are the causes? This condition may be caused by:  Certain medicines, such as: ? Pain relievers. ? Antihistamines. ? Antidepressants. ? Blood pressure medicines. ? Water pills (diuretics). ? Ulcer medicines. ? Muscle relaxants. ? Drugs.  Excessive drinking.  Psychological causes, such as: ? Anxiety. ? Depression. ? Sadness. ? Exhaustion. ? Performance fear. ? Stress.  Physical causes, such as: ? Artery problems. This may include diabetes, smoking, liver disease, or atherosclerosis. ? High blood pressure. ? Hormonal problems, such as low testosterone. ? Obesity. ? Nerve problems. This may include back or pelvic injuries, diabetes mellitus, multiple sclerosis, or Parkinson disease. What are the signs or symptoms? Symptoms of this condition include:  Inability to get an erection.  Lack of enough hardness of the erection to allow penetration.  Loss of the erection before sex is finished.  Normal erections at some times, but with frequent unsatisfactory episodes.  Low sexual satisfaction in either partner due to erection problems.  A curved penis occurring with erection. The curve may cause pain or the penis may be too curved to allow for intercourse.  Never having nighttime erections. How is this diagnosed? This condition is often diagnosed by:  Performing a physical exam to find other diseases or specific problems with the penis.  Asking you detailed questions about the problem.  Performing blood tests to check for diabetes mellitus or to measure hormone levels.  Performing other tests to check for underlying health conditions.  Performing an ultrasound  exam to check for scarring.  Performing a test to check blood flow to the penis.  Doing a sleep study at home to measure nighttime erections. How is this treated? This condition may be treated by:  Medicine taken by mouth to help you achieve an erection (oral medicine).  Hormone replacement therapy to replace low testosterone levels.  Medicine that is injected into the penis. Your health care provider may instruct you how to give yourself these injections at home.  Vacuum pump. This is a pump with a ring on it. The pump and ring are placed on the penis and used to create pressure that helps the penis become erect.  Penile implant surgery. In this procedure, you may receive: ? An inflatable implant. This consists of cylinders, a pump, and a reservoir. The cylinders can be inflated with a fluid that helps to create an erection, and they can be deflated after intercourse. ? A semi-rigid implant. This consists of two silicone rubber rods. The rods provide some rigidity. They are also flexible, so the penis can both curve downward in its normal position and become straight for sexual intercourse.  Blood vessel surgery, to improve blood flow to the penis. During this procedure, a blood vessel from a different part of the body is placed into the penis to allow blood to flow around (bypass) damaged or blocked blood vessels.  Lifestyle changes, such as exercising more, losing weight, and quitting smoking. Follow these instructions at home: Medicines   Take over-the-counter and prescription medicines only as told by your health care provider. Do not increase the dosage without first discussing it with your health care provider.  If you are using self-injections, perform injections as directed by your   health care provider. Make sure to avoid any veins that are on the surface of the penis. After giving an injection, apply pressure to the injection site for 5 minutes. General instructions   Exercise regularly, as directed by your health care provider. Work with your health care provider to lose weight, if needed.  Do not use any products that contain nicotine or tobacco, such as cigarettes and e-cigarettes. If you need help quitting, ask your health care provider.  Before using a vacuum pump, read the instructions that come with the pump and discuss any questions with your health care provider.  Keep all follow-up visits as told by your health care provider. This is important. Contact a health care provider if:  You feel nauseous.  You vomit. Get help right away if:  You are taking oral or injectable medicines and you have an erection that lasts longer than 4 hours. If your health care provider is unavailable, go to the nearest emergency room for evaluation. An erection that lasts much longer than 4 hours can result in permanent damage to your penis.  You have severe pain in your groin or abdomen.  You develop redness or severe swelling of your penis.  You have redness spreading up into your groin or lower abdomen.  You are unable to urinate.  You experience chest pain or a rapid heart beat (palpitations) after taking oral medicines. Summary  Erectile dysfunction (ED) is the inability to get or keep an erection during sexual intercourse. This problem can usually be treated successfully.  This condition is diagnosed based on a physical exam, your symptoms, and tests to determine the cause. Treatment varies depending on the cause, and may include medicines, hormone therapy, surgery, or vacuum pump.  You may need follow-up visits to make sure that you are using your medicines or devices correctly.  Get help right away if you are taking or injecting medicines and you have an erection that lasts longer than 4 hours. This information is not intended to replace advice given to you by your health care provider. Make sure you discuss any questions you have with your health care  provider. Document Released: 09/07/2000 Document Revised: 08/23/2017 Document Reviewed: 09/26/2016 Elsevier Patient Education  2020 [employer]  

## 2019-06-04 ENCOUNTER — Encounter: Payer: Self-pay | Admitting: Orthopedic Surgery

## 2019-06-04 ENCOUNTER — Ambulatory Visit (INDEPENDENT_AMBULATORY_CARE_PROVIDER_SITE_OTHER): Payer: Medicare Other | Admitting: Orthopedic Surgery

## 2019-06-04 DIAGNOSIS — M25461 Effusion, right knee: Secondary | ICD-10-CM | POA: Diagnosis not present

## 2019-06-04 DIAGNOSIS — M25561 Pain in right knee: Secondary | ICD-10-CM | POA: Diagnosis not present

## 2019-06-04 MED ORDER — MELOXICAM 15 MG PO TABS: ORAL_TABLET | ORAL | 0 refills | Status: DC

## 2019-06-04 NOTE — Progress Notes (Signed)
Office Visit Note   Patient: Roy Martinez           Date of Birth: Sep 10, 1956           MRN: [id number] Visit Date: 06/04/2019 Requested by: Kerin Perna, NP [address] [place],  [place] [address] PCP: Kerin Perna, NP  Subjective: Chief Complaint  Patient presents with  . Right Knee - Pain    HPI: Halley is a patient with right knee pain.  Injured it about 2 years ago.  Had patellar tendon rupture repair.  He is also had the same thing happen on the left-hand side.  Been having some recent pain with that he is working doing power washing and car washing.  His daughter graduated from nursing school and lives in [place].  Not taking much in the way of medication for it.  Was interested in some type of pain medicine.              ROS: All systems reviewed are negative as they relate to the chief complaint within the history of present illness.  Patient denies  fevers or chills.   Assessment & Plan: Visit Diagnoses:  1. Pain and swelling of right knee     Plan: Impression is right knee pain with functional extensor mechanism but with about a 25 degree extensor lag bilaterally.  Mild patella alta bilaterally.  I think both knees the patellar tendon rupture repair has stretched out some but currently he is functional.  I would not recommend any further intervention at this time.  I am can get him a knee brace and have him try Mobic once a day for 30 days then as needed.  Follow-up as needed also.  Follow-Up Instructions: Return if symptoms worsen or fail to improve.   Orders:  No orders of the defined types were placed in this encounter.  Meds ordered this encounter  Medications  . meloxicam (MOBIC) 15 MG tablet    Sig: 1 po q d x 30 days then prn    Dispense:  60 tablet    Refill:  0      Procedures: No procedures performed   Clinical Data: No additional findings.  Objective: Vital Signs: There were no vitals taken for this visit.  Physical Exam:    Constitutional: Patient appears well-developed HEENT:  Head: Normocephalic Eyes:EOM are normal Neck: Normal range of motion Cardiovascular: Normal rate Pulmonary/chest: Effort normal Neurologic: Patient is alert Skin: Skin is warm Psychiatric: Patient has normal mood and affect    Ortho Exam: Ortho exam demonstrates no effusion in the right or left knee.  Patient has about a 25 degree extensor lag bilaterally.  He is able to walk and go up and down stairs.  No joint line tenderness is present.  Collateral ligaments are stable.  No effusion or warmth in the knee is present on the right-hand side.  Pedal pulses palpable.  Mild relative patella alta present bilaterally  Specialty Comments:  No specialty comments available.  Imaging: No results found.   PMFS History: Patient Active Problem List   Diagnosis Date Noted  . Smoker 03/28/2017  . Irregular heart beat 03/28/2017  . Abnormal EKG 03/28/2017  . ETOH abuse 03/28/2017  . Chronic hepatitis C without hepatic coma ([place]) 03/11/2017  . Patellar tendon rupture, right, initial encounter 06/26/2016   Past Medical History:  Diagnosis Date  . Chronic hepatitis C virus infection ([place])    under treatment June 2016  . H/O ETOH  abuse   . Smoker    1/2 ppd    Family History  Problem Relation Age of Onset  . Heart failure Mother   . Heart disease Mother   . Heart failure Father   . Arrhythmia Brother   . Colon cancer Neg Hx   . Esophageal cancer Neg Hx   . Rectal cancer Neg Hx   . Stomach cancer Neg Hx     Past Surgical History:  Procedure Laterality Date  . KNEE ARTHROSCOPY    . KNEE ARTHROSCOPY WITH MENISCAL REPAIR Right 06/26/2016   Procedure: KNEE ARTHROSCOPY WITH MENISCAL REPAIR VS RESECTION;  Surgeon: Meredith Pel, MD;  Location: [facility];  Service: Orthopedics;  Laterality: Right;  . PATELLAR TENDON REPAIR Right 06/26/2016   Procedure: PATELLA TENDON REPAIR;  Surgeon: Meredith Pel, MD;  Location: [facility];   Service: Orthopedics;  Laterality: Right;   Social History   Occupational History  . Not on file  Tobacco Use  . Smoking status: Current Every Day Smoker    Packs/day: 0.50    Types: Cigarettes  . Smokeless tobacco: Never Used  . Tobacco comment: cutting back  Substance and Sexual Activity  . Alcohol use: Yes    Alcohol/week: 5.0 - 6.0 standard drinks    Types: 5 - 6 Cans of beer per week    Comment: pt states daily beer drinker  . Drug use: Yes    Types: "Crack" cocaine    Comment: Quit 4 years ago  . Sexual activity: Never

## 2019-06-25 ENCOUNTER — Ambulatory Visit (INDEPENDENT_AMBULATORY_CARE_PROVIDER_SITE_OTHER): Payer: Medicare Other | Admitting: Primary Care

## 2019-06-25 ENCOUNTER — Encounter (INDEPENDENT_AMBULATORY_CARE_PROVIDER_SITE_OTHER): Payer: Self-pay | Admitting: Primary Care

## 2019-06-25 ENCOUNTER — Other Ambulatory Visit: Payer: Self-pay

## 2019-06-25 VITALS — BP 130/77 | HR 59 | Temp 97.5°F | Ht 73.0 in | Wt 174.6 lb

## 2019-06-25 DIAGNOSIS — N529 Male erectile dysfunction, unspecified: Secondary | ICD-10-CM | POA: Diagnosis not present

## 2019-06-25 DIAGNOSIS — I10 Essential (primary) hypertension: Secondary | ICD-10-CM | POA: Diagnosis not present

## 2019-06-25 DIAGNOSIS — R1084 Generalized abdominal pain: Secondary | ICD-10-CM | POA: Diagnosis not present

## 2019-06-25 DIAGNOSIS — F10188 Alcohol abuse with other alcohol-induced disorder: Secondary | ICD-10-CM

## 2019-06-25 DIAGNOSIS — F1010 Alcohol abuse, uncomplicated: Secondary | ICD-10-CM

## 2019-06-25 MED ORDER — TADALAFIL 20 MG PO TABS: 10.0000 mg | ORAL_TABLET | ORAL | 1 refills | Status: DC | PRN

## 2019-06-25 NOTE — Patient Instructions (Addendum)

## 2019-06-25 NOTE — Progress Notes (Signed)
pt complains of pain on the left side when he drinks beer. Would like to be checked for cirrhosis.

## 2019-07-02 NOTE — Progress Notes (Signed)
Established Patient Office Visit  Subjective:  Patient ID: Roy Martinez, male    DOB: [birth year]  Age: 63 y.o. MRN: [id number]  CC:  Chief Complaint  Patient presents with  . Follow-up    BP recheck     HPI Roy Martinez presents for re-evaluation of blood pressure actually taking it Bp 130/77 previously 142/92. improvement . He denies shortness of breath, headaches, chest pain or lower extremity edema. He voices concerns of abdominal pain only when he drinks a beer and he enjoys his beers. Asked does you stomach hurt with food, water, sodas or anything else- no  Past Medical History:  Diagnosis Date  . Chronic hepatitis C virus infection ([place])    under treatment June 2016  . H/O ETOH abuse   . Smoker    1/2 ppd    Past Surgical History:  Procedure Laterality Date  . KNEE ARTHROSCOPY    . KNEE ARTHROSCOPY WITH MENISCAL REPAIR Right 06/26/2016   Procedure: KNEE ARTHROSCOPY WITH MENISCAL REPAIR VS RESECTION;  Surgeon: Meredith Pel, MD;  Location: [facility];  Service: Orthopedics;  Laterality: Right;  . PATELLAR TENDON REPAIR Right 06/26/2016   Procedure: PATELLA TENDON REPAIR;  Surgeon: Meredith Pel, MD;  Location: [facility];  Service: Orthopedics;  Laterality: Right;    Family History  Problem Relation Age of Onset  . Heart failure Mother   . Heart disease Mother   . Heart failure Father   . Arrhythmia Brother   . Colon cancer Neg Hx   . Esophageal cancer Neg Hx   . Rectal cancer Neg Hx   . Stomach cancer Neg Hx     Social History   Socioeconomic History  . Marital status: Single    Spouse name: Not on file  . Number of children: Not on file  . Years of education: Not on file  . Highest education level: Not on file  Occupational History  . Not on file  Social Needs  . Financial resource strain: Not on file  . Food insecurity    Worry: Not on file    Inability: Not on file  . Transportation needs    Medical: Not on file    Non-medical: Not on file   Tobacco Use  . Smoking status: Current Every Day Smoker    Packs/day: 0.50    Types: Cigarettes  . Smokeless tobacco: Never Used  . Tobacco comment: cutting back  Substance and Sexual Activity  . Alcohol use: Yes    Alcohol/week: 5.0 - 6.0 standard drinks    Types: 5 - 6 Cans of beer per week    Comment: pt states daily beer drinker  . Drug use: Yes    Types: "Crack" cocaine    Comment: Quit 4 years ago  . Sexual activity: Never  Lifestyle  . Physical activity    Days per week: Not on file    Minutes per session: Not on file  . Stress: Not on file  Relationships  . Social Herbalist on phone: Not on file    Gets together: Not on file    Attends religious service: Not on file    Active member of club or organization: Not on file    Attends meetings of clubs or organizations: Not on file    Relationship status: Not on file  . Intimate partner violence    Fear of current or ex partner: Not on file    Emotionally abused: Not on  file    Physically abused: Not on file    Forced sexual activity: Not on file  Other Topics Concern  . Not on file  Social History Narrative   ** Merged History Encounter **        Outpatient Medications Prior to Visit  Medication Sig Dispense Refill  . hydrochlorothiazide (HYDRODIURIL) 25 MG tablet Take 1 tablet (25 mg total) by mouth daily. 90 tablet 3  . meloxicam (MOBIC) 15 MG tablet 1 po q d x 30 days then prn (Patient not taking: Reported on 06/25/2019) 60 tablet 0   No facility-administered medications prior to visit.     Allergies  Allergen Reactions  . Penicillins Hives    Childhood allergy Has patient had a PCN reaction causing immediate rash, facial/tongue/throat swelling, SOB or lightheadedness with hypotension: No Has patient had a PCN reaction causing severe rash involving mucus membranes or skin necrosis: No Has patient had a PCN reaction that required hospitalization No Has patient had a PCN reaction occurring  within the last 10 years: No If all of the above answers are "NO", then may proceed with Cephalosporin use.     ROS Review of Systems  Gastrointestinal: Positive for abdominal pain.  All other systems reviewed and are negative.     Objective:    Physical Exam  Constitutional: He appears well-developed and well-nourished.  Cardiovascular: Normal rate and regular rhythm.  Pulmonary/Chest: Effort normal and breath sounds normal.  Abdominal: Soft. Bowel sounds are normal.  Psychiatric: He has a normal mood and affect.    BP 130/77 (BP Location: Left Arm, Patient Position: Sitting, Cuff Size: Normal)   Pulse (!) 59   Temp (!) 97.5 F (36.4 C) (Temporal)   Ht '6\' 1"'$  (1.854 m)   Wt 174 lb 9.6 oz (79.2 kg)   SpO2 96%   BMI 23.04 kg/m  Wt Readings from Last 3 Encounters:  06/25/19 174 lb 9.6 oz (79.2 kg)  05/26/19 179 lb (81.2 kg)  10/28/18 181 lb (82.1 kg)     Health Maintenance Due  Topic Date Due  . INFLUENZA VACCINE  04/25/2019    There are no preventive care reminders to display for this patient.  Lab Results  Component Value Date   TSH 0.485 03/28/2017   Lab Results  Component Value Date   WBC 6.6 12/28/2017   HGB 14.0 12/28/2017   HCT 43.0 12/28/2017   MCV 94.5 12/28/2017   PLT 331 12/28/2017   Lab Results  Component Value Date   NA 137 12/28/2017   K 3.6 12/28/2017   CO2 22 12/28/2017   GLUCOSE 113 (H) 12/28/2017   BUN 14 12/28/2017   CREATININE 0.67 12/28/2017   BILITOT 0.9 12/28/2017   ALKPHOS 55 12/28/2017   AST 27 12/28/2017   ALT 23 12/28/2017   PROT 7.2 12/28/2017   ALBUMIN 3.7 12/28/2017   CALCIUM 9.0 12/28/2017   ANIONGAP 11 12/28/2017   Lab Results  Component Value Date   CHOL 172 02/14/2017   Lab Results  Component Value Date   HDL 90 02/14/2017   Lab Results  Component Value Date   LDLCALC 66 02/14/2017   Lab Results  Component Value Date   TRIG 82 02/14/2017   Lab Results  Component Value Date   CHOLHDL 1.9  02/14/2017   No results found for: HGBA1C    Assessment & Plan:   Roy Martinez was seen today for follow-up.  Diagnoses and all orders for this visit:  ETOH abuse Easy  solution to his problem stop drinking beer. Provided information on AVS for resources to contact to help with stopping to drinking beer -     CBC with Differential/Platelet; Future  Erectile dysfunction, unspecified erectile dysfunction type Patient is stating he is unable to an erection firm enough to have sexual intercourse.  Will prescribe-  tadalafil (CIALIS) 20 MG tablet; Take 0.5-1 tablets (10-20 mg total) by mouth every other day as needed for erectile dysfunction. As long as his blood pressure continues to improve  Essential hypertension Almost at goal of 130/80,improved from the previous visit continue  low-sodium diet, medication compliance, 150 minutes of moderate intensity exercise per week. Discussed medication compliance importance of taking medication daily as prescribed -     CMP14+EGFR; Future -     Lipid panel; Future    Meds ordered this encounter  Medications  . tadalafil (CIALIS) 20 MG tablet    Sig: Take 0.5-1 tablets (10-20 mg total) by mouth every other day as needed for erectile dysfunction.    Dispense:  5 tablet    Refill:  1    Follow-up: Return in about 3 months (around 09/25/2019) for Blood pressure in person, schedule for lab appt.    Kerin Perna, NP

## 2019-07-03 ENCOUNTER — Other Ambulatory Visit (INDEPENDENT_AMBULATORY_CARE_PROVIDER_SITE_OTHER): Payer: Medicare Other

## 2019-07-03 ENCOUNTER — Other Ambulatory Visit: Payer: Self-pay

## 2019-07-03 DIAGNOSIS — I10 Essential (primary) hypertension: Secondary | ICD-10-CM

## 2019-07-03 DIAGNOSIS — F1010 Alcohol abuse, uncomplicated: Secondary | ICD-10-CM

## 2019-07-04 LAB — CBC WITH DIFFERENTIAL/PLATELET
Basophils Absolute: 0 10*3/uL (ref 0.0–0.2)
Basos: 1 %
EOS (ABSOLUTE): 0.2 10*3/uL (ref 0.0–0.4)
Eos: 3 %
Hematocrit: 37 % — ABNORMAL LOW (ref 37.5–51.0)
Hemoglobin: 12.8 g/dL — ABNORMAL LOW (ref 13.0–17.7)
Immature Grans (Abs): 0 10*3/uL (ref 0.0–0.1)
Immature Granulocytes: 0 %
Lymphocytes Absolute: 2.1 10*3/uL (ref 0.7–3.1)
Lymphs: 41 %
MCH: 30.4 pg (ref 26.6–33.0)
MCHC: 34.6 g/dL (ref 31.5–35.7)
MCV: 88 fL (ref 79–97)
Monocytes Absolute: 0.6 10*3/uL (ref 0.1–0.9)
Monocytes: 11 %
Neutrophils Absolute: 2.3 10*3/uL (ref 1.4–7.0)
Neutrophils: 44 %
Platelets: 324 10*3/uL (ref 150–450)
RBC: 4.21 x10E6/uL (ref 4.14–5.80)
RDW: 11.8 % (ref 11.6–15.4)
WBC: 5.2 10*3/uL (ref 3.4–10.8)

## 2019-07-04 LAB — CMP14+EGFR
ALT: 14 IU/L (ref 0–44)
AST: 25 IU/L (ref 0–40)
Albumin/Globulin Ratio: 1.3 (ref 1.2–2.2)
Albumin: 4 g/dL (ref 3.8–4.8)
Alkaline Phosphatase: 80 IU/L (ref 39–117)
BUN/Creatinine Ratio: 20 (ref 10–24)
BUN: 15 mg/dL (ref 8–27)
Bilirubin Total: 0.3 mg/dL (ref 0.0–1.2)
CO2: 21 mmol/L (ref 20–29)
Calcium: 8.6 mg/dL (ref 8.6–10.2)
Chloride: 103 mmol/L (ref 96–106)
Creatinine, Ser: 0.75 mg/dL — ABNORMAL LOW (ref 0.76–1.27)
GFR calc Af Amer: 114 mL/min/{1.73_m2} (ref >59)
GFR calc non Af Amer: 98 mL/min/{1.73_m2} (ref >59)
Globulin, Total: 3 g/dL (ref 1.5–4.5)
Glucose: 103 mg/dL — ABNORMAL HIGH (ref 65–99)
Potassium: 3.5 mmol/L (ref 3.5–5.2)
Sodium: 136 mmol/L (ref 134–144)
Total Protein: 7 g/dL (ref 6.0–8.5)

## 2019-07-04 LAB — LIPID PANEL
Chol/HDL Ratio: 2.5 ratio (ref 0.0–5.0)
Cholesterol, Total: 159 mg/dL (ref 100–199)
HDL: 63 mg/dL (ref >39)
LDL Chol Calc (NIH): 74 mg/dL (ref 0–99)
Triglycerides: 123 mg/dL (ref 0–149)
VLDL Cholesterol Cal: 22 mg/dL (ref 5–40)

## 2019-07-06 ENCOUNTER — Telehealth (INDEPENDENT_AMBULATORY_CARE_PROVIDER_SITE_OTHER): Payer: Self-pay

## 2019-07-06 NOTE — Telephone Encounter (Signed)
-----   Message from Kerin Perna, NP sent at 07/05/2019  8:09 PM EDT ----- I have reviewed all labs and they are normal

## 2019-07-06 NOTE — Telephone Encounter (Signed)
Left voicemail notifying patient that all labs are normal. Return call to [facility] at [phone]. Nat Christen, CMA

## 2019-09-28 ENCOUNTER — Other Ambulatory Visit: Payer: Self-pay

## 2019-09-28 ENCOUNTER — Encounter (INDEPENDENT_AMBULATORY_CARE_PROVIDER_SITE_OTHER): Payer: Self-pay | Admitting: Primary Care

## 2019-09-28 ENCOUNTER — Ambulatory Visit (INDEPENDENT_AMBULATORY_CARE_PROVIDER_SITE_OTHER): Payer: Medicare Other | Admitting: Primary Care

## 2019-09-28 VITALS — BP 133/80 | HR 57 | Temp 97.2°F | Ht 73.0 in | Wt 180.0 lb

## 2019-09-28 DIAGNOSIS — F17200 Nicotine dependence, unspecified, uncomplicated: Secondary | ICD-10-CM | POA: Diagnosis not present

## 2019-09-28 DIAGNOSIS — I10 Essential (primary) hypertension: Secondary | ICD-10-CM | POA: Diagnosis not present

## 2019-09-28 DIAGNOSIS — F1010 Alcohol abuse, uncomplicated: Secondary | ICD-10-CM

## 2019-09-28 NOTE — Patient Instructions (Signed)
Tobacco Use Disorder Tobacco use disorder (TUD) occurs when a person craves, seeks, and uses tobacco, regardless of the consequences. This disorder can cause problems with mental and physical health. It can affect your ability to have healthy relationships, and it can keep you from meeting your responsibilities at work, home, or school. Tobacco may be:  Smoked as a cigarette or cigar.  Inhaled using e-cigarettes.  Smoked in a pipe or hookah.  Chewed as smokeless tobacco.  Inhaled into the nostrils as snuff. Tobacco products contain a dangerous chemical called nicotine, which is very addictive. Nicotine triggers hormones that make the body feel stimulated and works on areas of the brain that make you feel good. These effects can make it hard for people to quit nicotine. Tobacco contains many other unsafe chemicals that can damage almost every organ in the body. Smoking tobacco also puts others in danger due to fire risk and possible health problems caused by breathing in secondhand smoke. What are the signs or symptoms? Symptoms of TUD may include:  Being unable to slow down or stop your tobacco use.  Spending an abnormal amount of time getting or using tobacco.  Craving tobacco. Cravings may last for up to 6 months after quitting.  Tobacco use that: ? Interferes with your work, school, or home life. ? Interferes with your personal and social relationships. ? Makes you give up activities that you once enjoyed or found important.  Using tobacco even though you know that it is: ? Dangerous or bad for your health or someone else's health. ? Causing problems in your life.  Needing more and more of the substance to get the same effect (developing tolerance).  Experiencing unpleasant symptoms if you do not use the substance (withdrawal). Withdrawal symptoms may include: ? Depressed, anxious, or irritable mood. ? Difficulty concentrating. ? Increased appetite. ? Restlessness or trouble  sleeping.  Using the substance to avoid withdrawal. How is this diagnosed? This condition may be diagnosed based on:  Your current and past tobacco use. Your health care provider may ask questions about how your tobacco use affects your life.  A physical exam. You may be diagnosed with TUD if you have at least two symptoms within a 12-month period. How is this treated? This condition is treated by stopping tobacco use. Many people are unable to quit on their own and need help. Treatment may include:  Nicotine replacement therapy (NRT). NRT provides nicotine without the other harmful chemicals in tobacco. NRT gradually lowers the dosage of nicotine in the body and reduces withdrawal symptoms. NRT is available as: ? Over-the-counter gums, lozenges, and skin patches. ? Prescription mouth inhalers and nasal sprays.  Medicine that acts on the brain to reduce cravings and withdrawal symptoms.  A type of talk therapy that examines your triggers for tobacco use, how to avoid them, and how to cope with cravings (behavioral therapy).  Hypnosis. This may help with withdrawal symptoms.  Joining a support group for others coping with TUD. The best treatment for TUD is usually a combination of medicine, talk therapy, and support groups. Recovery can be a long process. Many people start using tobacco again after stopping (relapse). If you relapse, it does not mean that treatment will not work. Follow these instructions at home:  Lifestyle  Do not use any products that contain nicotine or tobacco, such as cigarettes and e-cigarettes.  Avoid things that trigger tobacco use as much as you can. Triggers include people and situations that usually cause you   to use tobacco.  Avoid drinks that contain caffeine, including coffee. These may worsen some withdrawal symptoms.  Find ways to manage stress. Wanting to smoke may cause stress, and stress can make you want to smoke. Relaxation techniques such as  deep breathing, meditation, and yoga may help.  Attend support groups as needed. These groups are an important part of long-term recovery for many people. General instructions  Take over-the-counter and prescription medicines only as told by your health care provider.  Check with your health care provider before taking any new prescription or over-the-counter medicines.  Decide on a friend, family member, or smoking quit-line (such as 1-800-QUIT-NOW in the [place] that you can call or text when you feel the urge to smoke or when you need help coping with cravings.  Keep all follow-up visits as told by your health care provider and therapist. This is important. Contact a health care provider if:  You are not able to take your medicines as prescribed.  Your symptoms get worse, even with treatment. Summary  Tobacco use disorder (TUD) occurs when a person craves, seeks, and uses tobacco regardless of the consequences.  This condition may be diagnosed based on your current and past tobacco use and a physical exam.  Many people are unable to quit on their own and need help. Recovery can be a long process.  The most effective treatment for TUD is usually a combination of medicine, talk therapy, and support groups. This information is not intended to replace advice given to you by your health care provider. Make sure you discuss any questions you have with your health care provider. Document Revised: 08/28/2017 Document Reviewed: 08/28/2017 Elsevier Patient Education  2020 [employer] Inc[employer]  

## 2019-09-28 NOTE — Progress Notes (Signed)
Presents for  hypertension evaluation -He denies shortness of breath, headaches, chest pain or lower extremity edema. No changes on  previous visit continued on HCTZ 25mg  medication.  Patient is adherence with medications.  Current Medication List Current Outpatient Medications on File Prior to Visit  Medication Sig Dispense Refill  . hydrochlorothiazide (HYDRODIURIL) 25 MG tablet Take 1 tablet (25 mg total) by mouth daily. 90 tablet 3  . meloxicam (MOBIC) 15 MG tablet 1 po q d x 30 days then prn (Patient not taking: Reported on 06/25/2019) 60 tablet 0  . tadalafil (CIALIS) 20 MG tablet Take 0.5-1 tablets (10-20 mg total) by mouth every other day as needed for erectile dysfunction. (Patient not taking: Reported on 09/28/2019) 5 tablet 1   No current facility-administered medications on file prior to visit.   Past Medical History  Past Medical History:  Diagnosis Date  . Chronic hepatitis C virus infection ([place])    under treatment June 2016  . H/O ETOH abuse   . Smoker    1/2 ppd   Dietary habits include: low sodium diet with occasional salt use Exercise habits include:none  Family / Social history: father heart disease- pace maker   ASCVD risk factors include- [name]  O:  Physical Exam Vitals reviewed.  Constitutional:      Appearance: Normal appearance.  Cardiovascular:     Rate and Rhythm: Normal rate and regular rhythm.  Pulmonary:     Effort: Pulmonary effort is normal.     Breath sounds: Normal breath sounds.  Abdominal:     General: Bowel sounds are normal.  Musculoskeletal:        General: Normal range of motion.     Cervical back: Normal range of motion.  Neurological:     Mental Status: He is alert and oriented to person, place, and time.  Psychiatric:        Mood and Affect: Mood normal.        Behavior: Behavior normal.        Thought Content: Thought content normal.        Judgment: Judgment normal.      Review of Systems  All other systems reviewed and  are negative.   Last 3 Office BP readings: BP Readings from Last 3 Encounters:  09/28/19 133/80  06/25/19 130/77  05/26/19 (!) 143/86    BMET    Component Value Date/Time   NA 136 07/03/2019 1011   K 3.5 07/03/2019 1011   CL 103 07/03/2019 1011   CO2 21 07/03/2019 1011   GLUCOSE 103 (H) 07/03/2019 1011   GLUCOSE 113 (H) 12/28/2017 1426   BUN 15 07/03/2019 1011   CREATININE 0.75 (L) 07/03/2019 1011   CREATININE 0.72 03/11/2017 0953   CALCIUM 8.6 07/03/2019 1011   GFRNONAA 98 07/03/2019 1011   GFRNONAA >89 03/11/2017 0953   GFRAA 114 07/03/2019 1011   GFRAA >89 03/11/2017 0953    Renal function: CrCl cannot be calculated (Patient's most recent lab result is older than the maximum 21 days allowed.).  Clinical ASCVD: Yes The 10-year ASCVD risk score [name], et al., 2013) is: 23.1%   Values used to calculate the score:     Age: 64 years     Sex: Male     Is Non-Hispanic African American: Yes     Diabetic: No     Tobacco smoker: Yes     Systolic Blood Pressure: [id number] mmHg     Is BP treated: Yes  HDL Cholesterol: 63 mg/dL     Total Cholesterol: 159 mg/dL   A/P: Hypertension longstanding on current medications. BP Goal = 130/80  mmHg. Patient is adherent with current medications.  -F/u labs ordered - cmp,  -Counseled on lifestyle modifications for blood pressure control including reduced dietary sodium, increased exercise, adequate sleep Diagnoses and all orders for this visit:  Smoker Aware of increased risk for lung cancer and other respiratory diseases recommend cessation.  This will be reminded at each clinical visit. Decrease to 10 cigarettes a day   ETOH abuse Drinks beer 2 40oz daily increase risk for cirrhosis of the liver   Results reviewed and written information provided.   Total time in face-to-face counseling 10 minutes.   F/U Clinic Visit in 84months

## 2019-10-21 ENCOUNTER — Other Ambulatory Visit: Payer: Self-pay

## 2019-10-21 ENCOUNTER — Encounter (INDEPENDENT_AMBULATORY_CARE_PROVIDER_SITE_OTHER): Payer: Self-pay | Admitting: Primary Care

## 2019-10-21 ENCOUNTER — Ambulatory Visit (INDEPENDENT_AMBULATORY_CARE_PROVIDER_SITE_OTHER): Payer: Medicare Other | Admitting: Primary Care

## 2019-10-21 DIAGNOSIS — M545 Low back pain, unspecified: Secondary | ICD-10-CM

## 2019-10-21 MED ORDER — MELOXICAM 15 MG PO TABS: ORAL_TABLET | ORAL | 0 refills | Status: DC

## 2019-10-21 NOTE — Progress Notes (Signed)
Pt complains of lower back for the last 3-4 days

## 2019-10-21 NOTE — Progress Notes (Signed)
Virtual Visit via Telephone Note  I connected with Roy Martinez on 10/21/19 at 10:10 AM EST by telephone and verified that I am speaking with the correct person using two identifiers.   I discussed the limitations, risks, security and privacy concerns of performing an evaluation and management service by telephone and the availability of in person appointments. I also discussed with the patient that there may be a patient responsible charge related to this service. The patient expressed understanding and agreed to proceed.   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 This pain in lower back happen before a year or more ago.  Past Medical History:  Diagnosis Date  . Chronic hepatitis C virus infection ([place])    under treatment June 2016  . H/O ETOH abuse   . Smoker    1/2 ppd     Current Outpatient Medications on File Prior to Visit  Medication Sig Dispense Refill  . hydrochlorothiazide (HYDRODIURIL) 25 MG tablet Take 1 tablet (25 mg total) by mouth daily. (Patient not taking: Reported on 10/21/2019) 90 tablet 3  . meloxicam (MOBIC) 15 MG tablet 1 po q d x 30 days then prn (Patient not taking: Reported on 06/25/2019) 60 tablet 0  . tadalafil (CIALIS) 20 MG tablet Take 0.5-1 tablets (10-20 mg total) by mouth every other day as needed for erectile dysfunction. (Patient not taking: Reported on 09/28/2019) 5 tablet 1   No current facility-administered medications on file prior to visit.   Observations/Objective: Review of Systems  Musculoskeletal: Positive for back pain.  All other systems reviewed and are negative.  Assessment and Plan: [place] was seen today for back pain.  Diagnoses and all orders for this visit:  Acute bilateral low back pain, unspecified whether sciatica present  May be caused by repetitive motion and lifting explained he may alternate with heat and  ice application for pain relief. May also alternate with acetaminophen and Ibuprofen as prescribed pain relief. Other alternatives include massage, acupuncture and water aerobics.  You must stay active and avoid a sedentary lifestyle. Also prescribe meloxicam (MOBIC) 15 MG tablet; 1 po q d x 30 days then prn    Other orders -     meloxicam (MOBIC) 15 MG tablet; 1 po q d x 30 days then prn    Follow Up Instructions:    I discussed the assessment and treatment plan with the patient. The patient was provided an opportunity to ask questions and all were answered. The patient agreed with the plan and demonstrated an understanding of the instructions.   The patient was advised to call back or seek an in-person evaluation if the symptoms worsen or if the condition fails to improve as anticipated.  I provided 8 minutes of non-face-to-face time during this encounter.   Kerin Perna, NP

## 2020-05-03 ENCOUNTER — Ambulatory Visit (HOSPITAL_COMMUNITY)
Admission: EM | Admit: 2020-05-03 | Discharge: 2020-05-03 | Disposition: A | Discharge status: Home / Self Care | Payer: Medicare Other | Attending: Family Medicine | Admitting: Family Medicine

## 2020-05-03 ENCOUNTER — Other Ambulatory Visit: Payer: Self-pay

## 2020-05-03 ENCOUNTER — Encounter (HOSPITAL_COMMUNITY): Payer: Self-pay | Admitting: Emergency Medicine

## 2020-05-03 DIAGNOSIS — H5712 Ocular pain, left eye: Secondary | ICD-10-CM | POA: Diagnosis not present

## 2020-05-03 MED ORDER — ACETAMINOPHEN 500 MG PO TABS: 500.0000 mg | ORAL_TABLET | Freq: Four times a day (QID) | ORAL | 0 refills | Status: DC | PRN

## 2020-05-03 NOTE — ED Provider Notes (Signed)
[facility]    CSN: [id number] Arrival date & time: 05/03/20  1818      History   Chief Complaint Chief Complaint  Patient presents with  . Eye Pain    HPI Roy Martinez is a 64 y.o. male.   Patient reports for left eye pain.  He reports for the last week he has had intermittent left eye pain.  He describes it as sharp at times.  He reports this will last for a bit and then go away completely.  He cannot give a frequency.  He does not know of anything that causes the pain to come on.  Denies pain with light or dark rooms.  Denies change in his vision.  No discharge or tearing.  Describes pain is in the eye.  Denies pain or swelling around the eye.  No known traumas.  Works in [employer], has not got anything in the eye.  Wears glasses but no contacts.  Has not been scratching at the eye.  Has not noticed redness of the eye.  No fevers or chills.  No eye surgeries.     Past Medical History:  Diagnosis Date  . Chronic hepatitis C virus infection ([place])    under treatment June 2016  . H/O ETOH abuse   . Smoker    1/2 ppd    Patient Active Problem List   Diagnosis Date Noted  . Smoker 03/28/2017  . Irregular heart beat 03/28/2017  . Abnormal EKG 03/28/2017  . ETOH abuse 03/28/2017  . Chronic hepatitis C without hepatic coma ([place]) 03/11/2017  . Patellar tendon rupture, right, initial encounter 06/26/2016    Past Surgical History:  Procedure Laterality Date  . KNEE ARTHROSCOPY    . KNEE ARTHROSCOPY WITH MENISCAL REPAIR Right 06/26/2016   Procedure: KNEE ARTHROSCOPY WITH MENISCAL REPAIR VS RESECTION;  Surgeon: Meredith Pel, MD;  Location: [facility];  Service: Orthopedics;  Laterality: Right;  . PATELLAR TENDON REPAIR Right 06/26/2016   Procedure: PATELLA TENDON REPAIR;  Surgeon: Meredith Pel, MD;  Location: [facility];  Service: Orthopedics;  Laterality: Right;       Home Medications    Prior to Admission medications   Medication Sig  Start Date End Date Taking? Authorizing Provider  acetaminophen (TYLENOL) 500 MG tablet Take 1 tablet (500 mg total) by mouth every 6 (six) hours as needed. 05/03/20   Tu Shimmel, Marguerita Beards, PA-C  hydrochlorothiazide (HYDRODIURIL) 25 MG tablet Take 1 tablet (25 mg total) by mouth daily. Patient not taking: Reported on 10/21/2019 05/26/19   Kerin Perna, NP  meloxicam (MOBIC) 15 MG tablet 1 po q d x 30 days then prn 10/21/19   Kerin Perna, NP  tadalafil (CIALIS) 20 MG tablet Take 0.5-1 tablets (10-20 mg total) by mouth every other day as needed for erectile dysfunction. Patient not taking: Reported on 09/28/2019 06/25/19   Kerin Perna, NP    Family History Family History  Problem Relation Age of Onset  . Heart failure Mother   . Heart disease Mother   . Heart failure Father   . Arrhythmia Brother   . Colon cancer Neg Hx   . Esophageal cancer Neg Hx   . Rectal cancer Neg Hx   . Stomach cancer Neg Hx     Social History Social History   Tobacco Use  . Smoking status: Current Every Day Smoker    Packs/day: 0.50    Types: Cigarettes  . Smokeless tobacco: Never  Used  . Tobacco comment: cutting back  Vaping Use  . Vaping Use: Never used  Substance Use Topics  . Alcohol use: Yes    Alcohol/week: 5.0 - 6.0 standard drinks    Types: 5 - 6 Cans of beer per week    Comment: pt states daily beer drinker  . Drug use: Yes    Types: "Crack" cocaine    Comment: Quit 4 years ago     Allergies   Penicillins   Review of Systems Review of Systems   Physical Exam Triage Vital Signs ED Triage Vitals  Enc Vitals Group     BP 05/03/20 1945 (!) 155/91     Pulse Rate 05/03/20 1945 60     Resp 05/03/20 1945 16     Temp 05/03/20 1945 98.4 F (36.9 C)     Temp Source 05/03/20 1945 Oral     SpO2 05/03/20 1945 100 %     Weight --      Height --      Head Circumference --      Peak Flow --      Pain Score 05/03/20 1947 6     Pain Loc --      Pain Edu? --      Excl. in [place]?  --    No data found.  Updated Vital Signs BP (!) 155/91 (BP Location: Right Arm)   Pulse 60   Temp 98.4 F (36.9 C) (Oral)   Resp 16   SpO2 100%   Visual Acuity Right Eye Distance: 20/20 Left Eye Distance: 20/25 Bilateral Distance: 20/20  Right Eye Near:   Left Eye Near:    Bilateral Near:     Physical Exam Vitals and nursing note reviewed.  Constitutional:      Appearance: Normal appearance.  Eyes:     General: Lids are everted, no foreign bodies appreciated. Vision grossly intact. Gaze aligned appropriately. No allergic shiner, visual field deficit or scleral icterus.       Right eye: No discharge or hordeolum.        Left eye: No discharge or hordeolum.     Extraocular Movements: Extraocular movements intact.     Right eye: Normal extraocular motion and no nystagmus.     Left eye: Normal extraocular motion and no nystagmus.     Conjunctiva/sclera: Conjunctivae normal.     Right eye: Right conjunctiva is not injected. No chemosis, exudate or hemorrhage.    Left eye: Left conjunctiva is not injected. No chemosis, exudate or hemorrhage.    Pupils: Pupils are equal, round, and reactive to light.     Visual Fields: Right eye visual fields normal and left eye visual fields normal.     Comments: Fluorescein exam without uptake No global tenderness.  Visual acuity normal.  No temporal tenderness.  Cardiovascular:     Rate and Rhythm: Normal rate.  Pulmonary:     Effort: Pulmonary effort is normal. No respiratory distress.  Neurological:     Mental Status: He is alert.      UC Treatments / Results  Labs (all labs ordered are listed, but only abnormal results are displayed) Labs Reviewed - No data to display  EKG   Radiology No results found.  Procedures Procedures (including critical care time)  Medications Ordered in UC Medications - No data to display  Initial Impression / Assessment and Plan / UC Course  I have reviewed the triage vital signs and  the nursing notes.  Pertinent  labs & imaging results that were available during my care of the patient were reviewed by me and considered in my medical decision making (see chart for details).     #Left eye pain Patient is a 64 year old gentleman presenting with left eye pain no obvious cause in urgent care.  Visual acuity normal and equal in both eyes.  Fluorescein without uptake.  Visual fields intact.  Doubt glaucoma.  Discussed with patient that he should have further evaluation by ophthalmologist instructed him to call tomorrow.  His basic Tylenol for pain management tonight.  He verbalized agreement and understand this plan. Final Clinical Impressions(s) / UC Diagnoses   Final diagnoses:  Left eye pain     Discharge Instructions     Take Tylenol tonight.  Call Dr. Patrici Ranks office in the morning to be evaluated tomorrow if possible for your eye pain.  If you were to develop sudden onset of loss of vision and severe pain go to the emergency department    ED Prescriptions    Medication Sig Dispense Auth. Provider   acetaminophen (TYLENOL) 500 MG tablet Take 1 tablet (500 mg total) by mouth every 6 (six) hours as needed. 30 tablet Serjio Deupree, Marguerita Beards, PA-C     PDMP not reviewed this encounter.   Purnell Shoemaker, PA-C 05/03/20 2050

## 2020-05-03 NOTE — Discharge Instructions (Signed)
Take Tylenol tonight.  Call Dr. Patrici Ranks office in the morning to be evaluated tomorrow if possible for your eye pain.  If you were to develop sudden onset of loss of vision and severe pain go to the emergency department

## 2020-05-03 NOTE — ED Triage Notes (Signed)
Pt presents to [facility] for assessment of left eye pain x 1 week.  Patient denies injury, denies known exposure.

## 2020-05-04 ENCOUNTER — Telehealth (INDEPENDENT_AMBULATORY_CARE_PROVIDER_SITE_OTHER): Payer: Medicare Other | Admitting: Primary Care

## 2020-05-04 ENCOUNTER — Telehealth (INDEPENDENT_AMBULATORY_CARE_PROVIDER_SITE_OTHER): Payer: Self-pay

## 2020-05-04 DIAGNOSIS — B182 Chronic viral hepatitis C: Secondary | ICD-10-CM

## 2020-05-04 DIAGNOSIS — H1045 Other chronic allergic conjunctivitis: Secondary | ICD-10-CM | POA: Diagnosis not present

## 2020-05-04 DIAGNOSIS — I10 Essential (primary) hypertension: Secondary | ICD-10-CM

## 2020-05-04 DIAGNOSIS — E782 Mixed hyperlipidemia: Secondary | ICD-10-CM

## 2020-05-04 DIAGNOSIS — H04123 Dry eye syndrome of bilateral lacrimal glands: Secondary | ICD-10-CM | POA: Diagnosis not present

## 2020-05-04 DIAGNOSIS — Z125 Encounter for screening for malignant neoplasm of prostate: Secondary | ICD-10-CM

## 2020-05-04 DIAGNOSIS — H0288B Meibomian gland dysfunction left eye, upper and lower eyelids: Secondary | ICD-10-CM | POA: Diagnosis not present

## 2020-05-04 DIAGNOSIS — H40033 Anatomical narrow angle, bilateral: Secondary | ICD-10-CM | POA: Diagnosis not present

## 2020-05-04 DIAGNOSIS — H0288A Meibomian gland dysfunction right eye, upper and lower eyelids: Secondary | ICD-10-CM | POA: Diagnosis not present

## 2020-05-04 DIAGNOSIS — R351 Nocturia: Secondary | ICD-10-CM

## 2020-05-04 NOTE — Telephone Encounter (Signed)
Copied from [place] [id number]. Topic: General - Call Back - No Documentation >> May 04, 2020  3:58 PM Erick Blinks wrote: Reason for CRM: Pt called and stated that he is missing his BP medication, does not know the name of it but states PCP "was supposed to have called it in"  Best contact: [phone]    Please advice

## 2020-05-04 NOTE — Progress Notes (Signed)
Virtual Visit  I connected with Roy Martinez on 05/04/20 at  9:10 AM EDT by telephone and verified that I am speaking with the correct person using two identifiers.. Patient is at home in the bed  I discussed the limitations, risks, security and privacy concerns of performing an evaluation and management service by telephone and the availability of in person appointments. I also discussed with the patient that there may be a patient responsible charge related to this service. The patient expressed understanding and agreed to proceed.  Roy Mire Np-C at office  History of Present Illness: Roy Martinez is a 64 year old male was concern about his prostate and wanted to be tested. He was thinking because his back was hurting may it was his prostate. He went to the Urgent care yesterday for left eye started hurting and was advised to see a eye doctor.   Observations/Objective: Review of Systems  Genitourinary:       Nocturia  All other systems reviewed and are negative.  Assessment and Plan: Diagnoses and all orders for this visit:  Essential hypertension Counseled on blood pressure goal of less than 130/80, low-sodium, DASH diet, medication compliance, 150 minutes of moderate intensity exercise per week. Discussed medication compliance, adverse effects. -     CMP14+EGFR; Future -     CBC with Differential/Platelet; Future  Chronic hepatitis C without hepatic coma (HCC) -     CBC with Differential/Platelet; Future  Screening PSA (prostate specific antigen) -     PSA; Future  Mixed hyperlipidemia Decrease your fatty foods, red meat, cheese, milk and increase fiber like whole grains and veggies. You can also add a fiber supplement like Metamucil or Benefiber.  -     Lipid panel; Future  Nocturia -     PSA; Future  Follow Up Instructions:    I discussed the assessment and treatment plan with the patient. The patient was provided an opportunity to ask questions and all were  answered. The patient agreed with the plan and demonstrated an understanding of the instructions.   The patient was advised to call back or seek an in-person evaluation if the symptoms worsen or if the condition fails to improve as anticipated.  I provided 22 minutes of non-face-to-face time during this encounter.   Kerin Perna, NP

## 2020-05-06 ENCOUNTER — Other Ambulatory Visit (INDEPENDENT_AMBULATORY_CARE_PROVIDER_SITE_OTHER): Payer: Self-pay | Admitting: Primary Care

## 2020-05-06 DIAGNOSIS — I10 Essential (primary) hypertension: Secondary | ICD-10-CM

## 2020-05-06 MED ORDER — HYDROCHLOROTHIAZIDE 25 MG PO TABS: 25.0000 mg | ORAL_TABLET | Freq: Every day | ORAL | 3 refills | Status: DC

## 2020-05-31 ENCOUNTER — Ambulatory Visit (INDEPENDENT_AMBULATORY_CARE_PROVIDER_SITE_OTHER): Payer: Medicare HMO | Admitting: Primary Care

## 2020-05-31 ENCOUNTER — Other Ambulatory Visit (INDEPENDENT_AMBULATORY_CARE_PROVIDER_SITE_OTHER): Payer: Self-pay | Admitting: Primary Care

## 2020-05-31 ENCOUNTER — Encounter (INDEPENDENT_AMBULATORY_CARE_PROVIDER_SITE_OTHER): Payer: Self-pay | Admitting: Primary Care

## 2020-05-31 ENCOUNTER — Other Ambulatory Visit: Payer: Self-pay

## 2020-05-31 VITALS — BP 137/82 | HR 55 | Temp 98.1°F | Ht 73.0 in | Wt 175.4 lb

## 2020-05-31 DIAGNOSIS — I10 Essential (primary) hypertension: Secondary | ICD-10-CM

## 2020-05-31 DIAGNOSIS — E782 Mixed hyperlipidemia: Secondary | ICD-10-CM

## 2020-05-31 DIAGNOSIS — Z125 Encounter for screening for malignant neoplasm of prostate: Secondary | ICD-10-CM | POA: Diagnosis not present

## 2020-05-31 DIAGNOSIS — Z23 Encounter for immunization: Secondary | ICD-10-CM

## 2020-05-31 DIAGNOSIS — B182 Chronic viral hepatitis C: Secondary | ICD-10-CM

## 2020-05-31 DIAGNOSIS — R351 Nocturia: Secondary | ICD-10-CM | POA: Diagnosis not present

## 2020-05-31 DIAGNOSIS — R69 Illness, unspecified: Secondary | ICD-10-CM | POA: Diagnosis not present

## 2020-05-31 MED ORDER — HYDROCHLOROTHIAZIDE 25 MG PO TABS: 25.0000 mg | ORAL_TABLET | Freq: Every day | ORAL | 3 refills | Status: DC

## 2020-05-31 NOTE — Progress Notes (Signed)
[facility]    Roy Martinez is a 64 year old male presents today because his insurance called and advised him he needed a appointment. He has fasting labs since August and will re-evaluate blood pressure. Only care gap is influenza vaccine and he will receive this today.  Patient reports adherence with medications.  Current Medication List Current Outpatient Medications on File Prior to Visit  Medication Sig Dispense Refill  . acetaminophen (TYLENOL) 500 MG tablet Take 1 tablet (500 mg total) by mouth every 6 (six) hours as needed. (Patient not taking: Reported on 05/31/2020) 30 tablet 0  . meloxicam (MOBIC) 15 MG tablet 1 po q d x 30 days then prn (Patient not taking: Reported on 05/31/2020) 60 tablet 0  . tadalafil (CIALIS) 20 MG tablet Take 0.5-1 tablets (10-20 mg total) by mouth every other day as needed for erectile dysfunction. (Patient not taking: Reported on 09/28/2019) 5 tablet 1   No current facility-administered medications on file prior to visit.   Past Medical History  Past Medical History:  Diagnosis Date  . Chronic hepatitis C virus infection ([place])    under treatment June 2016  . H/O ETOH abuse   . Smoker    1/2 ppd   Dietary habits include: fairly good  Exercise habits include: no but is going to sign up for the gym  Family / Social history: father MI  ASCVD risk factors include- [name] The 10-year ASCVD risk score [name], et al., 2013) is: 24.3%   Values used to calculate the score:     Age: 49 years     Sex: Male     Is Non-Hispanic African American: Yes     Diabetic: No     Tobacco smoker: Yes     Systolic Blood Pressure: 597 mmHg     Is BP treated: Yes     HDL Cholesterol: 63 mg/dL     Total Cholesterol: 159 mg/dL O:  Physical Exam Vitals reviewed.  Constitutional:      Appearance: Normal appearance.  HENT:     Head: Normocephalic.  Cardiovascular:     Rate and Rhythm: Normal rate and regular rhythm.     Pulses: Normal pulses.      Heart sounds: Normal heart sounds.  Pulmonary:     Effort: Pulmonary effort is normal.     Breath sounds: Normal breath sounds.  Abdominal:     General: Bowel sounds are normal.  Musculoskeletal:        General: Normal range of motion.     Cervical back: Normal range of motion.  Skin:    General: Skin is warm and dry.  Neurological:     Mental Status: He is alert and oriented to person, place, and time.  Psychiatric:        Mood and Affect: Mood normal.        Behavior: Behavior normal.        Thought Content: Thought content normal.        Judgment: Judgment normal.      Review of Systems  Genitourinary: Positive for frequency.       Nocturia  All other systems reviewed and are negative.   Last 3 Office BP readings: BP Readings from Last 3 Encounters:  05/31/20 137/82  05/03/20 (!) 155/91  09/28/19 133/80    BMET    Component Value Date/Time   NA 136 07/03/2019 1011   K 3.5 07/03/2019 1011   CL 103 07/03/2019 1011  CO2 21 07/03/2019 1011   GLUCOSE 103 (H) 07/03/2019 1011   GLUCOSE 113 (H) 12/28/2017 1426   BUN 15 07/03/2019 1011   CREATININE 0.75 (L) 07/03/2019 1011   CREATININE 0.72 03/11/2017 0953   CALCIUM 8.6 07/03/2019 1011   GFRNONAA 98 07/03/2019 1011   GFRNONAA >89 03/11/2017 0953   GFRAA 114 07/03/2019 1011   GFRAA >89 03/11/2017 0953    Renal function: CrCl cannot be calculated (Patient's most recent lab result is older than the maximum 21 days allowed.).  Clinical ASCVD: Yes  The 10-year ASCVD risk score [name], et al., 2013) is: 24.3%   Values used to calculate the score:     Age: 44 years     Sex: Male     Is Non-Hispanic African American: Yes     Diabetic: No     Tobacco smoker: Yes     Systolic Blood Pressure: 076 mmHg     Is BP treated: Yes     HDL Cholesterol: 63 mg/dL     Total Cholesterol: 159 mg/dL   A/P: Essential hypertension Hypertension longstanding currently hydrochlorothiazide 25 mg on current medications. BP  Goal = 130/80 mmHg. slightly above goal at 137/80 today will factor in age 34 patient is adherent with current medications.  -Continued  -F/u labs ordered -PSA, lipid panel, CBC with diff, and CMP -Counseled on lifestyle modifications for blood pressure control including reduced dietary sodium, increased exercise, adequate sleep  Need for immunization against influenza -     Flu Vaccine QUAD 36+ mos IM  Chronic hepatitis C without hepatic coma (HCC) -     CBC with Differential/Platelet  Mixed hyperlipidemia -     Lipid panel  Screening PSA (prostate specific antigen) -     PSA  Nocturia -     PSA   Kerin Perna

## 2020-05-31 NOTE — Patient Instructions (Signed)

## 2020-06-01 LAB — CMP14+EGFR
ALT: 20 IU/L (ref 0–44)
AST: 21 IU/L (ref 0–40)
Albumin/Globulin Ratio: 1.5 (ref 1.2–2.2)
Albumin: 4.4 g/dL (ref 3.8–4.8)
Alkaline Phosphatase: 70 IU/L (ref 48–121)
BUN/Creatinine Ratio: 19 (ref 10–24)
BUN: 14 mg/dL (ref 8–27)
Bilirubin Total: 0.7 mg/dL (ref 0.0–1.2)
CO2: 22 mmol/L (ref 20–29)
Calcium: 8.9 mg/dL (ref 8.6–10.2)
Chloride: 98 mmol/L (ref 96–106)
Creatinine, Ser: 0.75 mg/dL — ABNORMAL LOW (ref 0.76–1.27)
GFR calc Af Amer: 113 mL/min/{1.73_m2} (ref >59)
GFR calc non Af Amer: 98 mL/min/{1.73_m2} (ref >59)
Globulin, Total: 2.9 g/dL (ref 1.5–4.5)
Glucose: 94 mg/dL (ref 65–99)
Potassium: 3.9 mmol/L (ref 3.5–5.2)
Sodium: 135 mmol/L (ref 134–144)
Total Protein: 7.3 g/dL (ref 6.0–8.5)

## 2020-06-01 LAB — LIPID PANEL
Chol/HDL Ratio: 2.4 ratio (ref 0.0–5.0)
Cholesterol, Total: 189 mg/dL (ref 100–199)
HDL: 78 mg/dL (ref >39)
LDL Chol Calc (NIH): 91 mg/dL (ref 0–99)
Triglycerides: 117 mg/dL (ref 0–149)
VLDL Cholesterol Cal: 20 mg/dL (ref 5–40)

## 2020-06-01 LAB — CBC WITH DIFFERENTIAL/PLATELET
Basophils Absolute: 0 10*3/uL (ref 0.0–0.2)
Basos: 1 %
EOS (ABSOLUTE): 0.2 10*3/uL (ref 0.0–0.4)
Eos: 4 %
Hematocrit: 42.2 % (ref 37.5–51.0)
Hemoglobin: 14.1 g/dL (ref 13.0–17.7)
Immature Grans (Abs): 0 10*3/uL (ref 0.0–0.1)
Immature Granulocytes: 0 %
Lymphocytes Absolute: 2 10*3/uL (ref 0.7–3.1)
Lymphs: 36 %
MCH: 31.2 pg (ref 26.6–33.0)
MCHC: 33.4 g/dL (ref 31.5–35.7)
MCV: 93 fL (ref 79–97)
Monocytes Absolute: 0.7 10*3/uL (ref 0.1–0.9)
Monocytes: 12 %
Neutrophils Absolute: 2.7 10*3/uL (ref 1.4–7.0)
Neutrophils: 47 %
Platelets: 294 10*3/uL (ref 150–450)
RBC: 4.52 x10E6/uL (ref 4.14–5.80)
RDW: 11.9 % (ref 11.6–15.4)
WBC: 5.6 10*3/uL (ref 3.4–10.8)

## 2020-06-01 LAB — PSA: Prostate Specific Ag, Serum: 1.5 ng/mL (ref 0.0–4.0)

## 2020-06-03 ENCOUNTER — Telehealth (INDEPENDENT_AMBULATORY_CARE_PROVIDER_SITE_OTHER): Payer: Self-pay

## 2020-06-03 NOTE — Telephone Encounter (Signed)
Spoke with patient. After verifying his birth date he was made aware of the following results;Labs are normal. . Make sure you are drinking at least 48 oz of water per day. Work on eating a low fat, heart healthy diet and participate in regular aerobic exercise program to control as well. Exercise at least 30 minutes per day-5 days per week. Decrease red meat, fried foods and junk foods, sodas, sugary foods or drinks, unhealthy snacking, alcohol or smoking. He verbalized understanding and did not have any questions. Nat Christen, CMA

## 2020-06-03 NOTE — Telephone Encounter (Signed)
-----   Message from Kerin Perna, NP sent at 06/01/2020  9:14 PM EDT ----- Labs are  normal. . Make sure you are drinking at least 48 oz of water per day. Work on eating a low fat, heart healthy diet and participate in regular aerobic exercise program to control as well. Exercise at least  30 minutes per day-5 days per week. Decrease red meat,  fried foods and junk foods, sodas, sugary foods or drinks, unhealthy snacking, alcohol or smoking.

## 2020-06-08 ENCOUNTER — Ambulatory Visit (INDEPENDENT_AMBULATORY_CARE_PROVIDER_SITE_OTHER): Payer: Self-pay | Admitting: Primary Care

## 2020-06-08 ENCOUNTER — Other Ambulatory Visit: Payer: Self-pay

## 2020-06-08 ENCOUNTER — Encounter (INDEPENDENT_AMBULATORY_CARE_PROVIDER_SITE_OTHER): Payer: Self-pay | Admitting: Primary Care

## 2020-06-08 VITALS — BP 137/82 | HR 56 | Temp 97.5°F | Ht 73.0 in | Wt 176.6 lb

## 2020-06-08 DIAGNOSIS — H6123 Impacted cerumen, bilateral: Secondary | ICD-10-CM

## 2020-06-08 NOTE — Progress Notes (Signed)
Established Patient Office Visit  Subjective:  Patient ID: Roy Martinez, male    DOB: 1956/08/30  Age: 64 y.o. MRN: [id number]  CC:  Chief Complaint  Patient presents with  . Cerumen Impaction  . ear irrigation    HPI Roy Martinez is a 64 year old male who presents for cerumen impaction and ear irrigation.  He voices no other complaints or concerns.  Past Medical History:  Diagnosis Date  . Chronic hepatitis C virus infection ([place])    under treatment June 2016  . H/O ETOH abuse   . Smoker    1/2 ppd    Past Surgical History:  Procedure Laterality Date  . KNEE ARTHROSCOPY    . KNEE ARTHROSCOPY WITH MENISCAL REPAIR Right 06/26/2016   Procedure: KNEE ARTHROSCOPY WITH MENISCAL REPAIR VS RESECTION;  Surgeon: Meredith Pel, MD;  Location: [place];  Service: Orthopedics;  Laterality: Right;  . PATELLAR TENDON REPAIR Right 06/26/2016   Procedure: PATELLA TENDON REPAIR;  Surgeon: Meredith Pel, MD;  Location: [place];  Service: Orthopedics;  Laterality: Right;    Family History  Problem Relation Age of Onset  . Heart failure Mother   . Heart disease Mother   . Heart failure Father   . Arrhythmia Brother   . Colon cancer Neg Hx   . Esophageal cancer Neg Hx   . Rectal cancer Neg Hx   . Stomach cancer Neg Hx     Social History   Socioeconomic History  . Marital status: Single    Spouse name: Not on file  . Number of children: Not on file  . Years of education: Not on file  . Highest education level: Not on file  Occupational History  . Not on file  Tobacco Use  . Smoking status: Current Every Day Smoker    Packs/day: 0.50    Types: Cigarettes  . Smokeless tobacco: Never Used  . Tobacco comment: cutting back  Vaping Use  . Vaping Use: Never used  Substance and Sexual Activity  . Alcohol use: Yes    Alcohol/week: 5.0 - 6.0 standard drinks    Types: 5 - 6 Cans of beer per week    Comment: pt states daily beer drinker  . Drug use: Yes    Types: "Crack"  cocaine    Comment: Quit 4 years ago  . Sexual activity: Never  Other Topics Concern  . Not on file  Social History Narrative   ** Merged History Encounter **       Social Determinants of Health   Financial Resource Strain:   . Difficulty of Paying Living Expenses: Not on file  Food Insecurity:   . Worried About Charity fundraiser in the Last Year: Not on file  . Ran Out of Food in the Last Year: Not on file  Transportation Needs:   . Lack of Transportation (Medical): Not on file  . Lack of Transportation (Non-Medical): Not on file  Physical Activity:   . Days of Exercise per Week: Not on file  . Minutes of Exercise per Session: Not on file  Stress:   . Feeling of Stress : Not on file  Social Connections:   . Frequency of Communication with Friends and Family: Not on file  . Frequency of Social Gatherings with Friends and Family: Not on file  . Attends Religious Services: Not on file  . Active Member of Clubs or Organizations: Not on file  . Attends Archivist Meetings: Not on  file  . Marital Status: Not on file  Intimate Partner Violence:   . Fear of Current or Ex-Partner: Not on file  . Emotionally Abused: Not on file  . Physically Abused: Not on file  . Sexually Abused: Not on file    Outpatient Medications Prior to Visit  Medication Sig Dispense Refill  . hydrochlorothiazide (HYDRODIURIL) 25 MG tablet Take 1 tablet (25 mg total) by mouth daily. 90 tablet 3  . tadalafil (CIALIS) 20 MG tablet Take 0.5-1 tablets (10-20 mg total) by mouth every other day as needed for erectile dysfunction. (Patient not taking: Reported on 09/28/2019) 5 tablet 1  . acetaminophen (TYLENOL) 500 MG tablet Take 1 tablet (500 mg total) by mouth every 6 (six) hours as needed. (Patient not taking: Reported on 05/31/2020) 30 tablet 0  . meloxicam (MOBIC) 15 MG tablet 1 po q d x 30 days then prn (Patient not taking: Reported on 05/31/2020) 60 tablet 0   No facility-administered medications  prior to visit.    Allergies  Allergen Reactions  . Penicillins Hives    Childhood allergy Has patient had a PCN reaction causing immediate rash, facial/tongue/throat swelling, SOB or lightheadedness with hypotension: No Has patient had a PCN reaction causing severe rash involving mucus membranes or skin necrosis: No Has patient had a PCN reaction that required hospitalization No Has patient had a PCN reaction occurring within the last 10 years: No If all of the above answers are "NO", then may proceed with Cephalosporin use.     ROS Review of Systems  HENT: Positive for hearing loss.   All other systems reviewed and are negative.     Objective:    Physical Exam Vitals reviewed.  HENT:     Right Ear: There is impacted cerumen.     Left Ear: There is impacted cerumen.  Cardiovascular:     Rate and Rhythm: Normal rate and regular rhythm.  Pulmonary:     Effort: Pulmonary effort is normal.     Breath sounds: Normal breath sounds.  Musculoskeletal:        General: Normal range of motion.     Cervical back: Normal range of motion.  Skin:    General: Skin is warm and dry.  Neurological:     Mental Status: He is alert and oriented to person, place, and time.  Psychiatric:        Mood and Affect: Mood normal.        Behavior: Behavior normal.        Thought Content: Thought content normal.        Judgment: Judgment normal.     BP 137/82 (BP Location: Right Arm, Patient Position: Sitting, Cuff Size: Normal)   Pulse (!) 56   Temp (!) 97.5 F (36.4 C) (Temporal)   Ht 6\' 1"  (1.854 m)   Wt 176 lb 9.6 oz (80.1 kg)   SpO2 98%   BMI 23.30 kg/m  Wt Readings from Last 3 Encounters:  06/08/20 176 lb 9.6 oz (80.1 kg)  05/31/20 175 lb 6.4 oz (79.6 kg)  09/28/19 180 lb (81.6 kg)     There are no preventive care reminders to display for this patient.  There are no preventive care reminders to display for this patient.  Lab Results  Component Value Date   TSH 0.485  03/28/2017   Lab Results  Component Value Date   WBC 5.6 05/31/2020   HGB 14.1 05/31/2020   HCT 42.2 05/31/2020   MCV 93 05/31/2020  PLT 294 05/31/2020   Lab Results  Component Value Date   NA 135 05/31/2020   K 3.9 05/31/2020   CO2 22 05/31/2020   GLUCOSE 94 05/31/2020   BUN 14 05/31/2020   CREATININE 0.75 (L) 05/31/2020   BILITOT 0.7 05/31/2020   ALKPHOS 70 05/31/2020   AST 21 05/31/2020   ALT 20 05/31/2020   PROT 7.3 05/31/2020   ALBUMIN 4.4 05/31/2020   CALCIUM 8.9 05/31/2020   ANIONGAP 11 12/28/2017   Lab Results  Component Value Date   CHOL 189 05/31/2020   Lab Results  Component Value Date   HDL 78 05/31/2020   Lab Results  Component Value Date   LDLCALC 91 05/31/2020   Lab Results  Component Value Date   TRIG 117 05/31/2020   Lab Results  Component Value Date   CHOLHDL 2.4 05/31/2020   No results found for: HGBA1C    Assessment & Plan:  Roy Martinez was seen today for cerumen impaction and ear irrigation.  Diagnoses and all orders for this visit:  Hearing loss secondary to cerumen impaction, bilateral -     Ear Lavage     Follow-up: No follow-ups on file.    Kerin Perna, NP

## 2020-07-05 DIAGNOSIS — H524 Presbyopia: Secondary | ICD-10-CM | POA: Diagnosis not present

## 2020-07-05 DIAGNOSIS — Z0100 Encounter for examination of eyes and vision without abnormal findings: Secondary | ICD-10-CM | POA: Diagnosis not present

## 2020-08-02 DIAGNOSIS — I10 Essential (primary) hypertension: Secondary | ICD-10-CM | POA: Diagnosis not present

## 2020-08-02 DIAGNOSIS — Z8249 Family history of ischemic heart disease and other diseases of the circulatory system: Secondary | ICD-10-CM | POA: Diagnosis not present

## 2020-08-02 DIAGNOSIS — Z681 Body mass index (BMI) 19 or less, adult: Secondary | ICD-10-CM | POA: Diagnosis not present

## 2020-08-02 DIAGNOSIS — G8929 Other chronic pain: Secondary | ICD-10-CM | POA: Diagnosis not present

## 2020-08-02 DIAGNOSIS — I739 Peripheral vascular disease, unspecified: Secondary | ICD-10-CM | POA: Diagnosis not present

## 2020-08-02 DIAGNOSIS — Z880 Allergy status to penicillin: Secondary | ICD-10-CM | POA: Diagnosis not present

## 2020-08-02 DIAGNOSIS — R636 Underweight: Secondary | ICD-10-CM | POA: Diagnosis not present

## 2020-08-02 DIAGNOSIS — Z008 Encounter for other general examination: Secondary | ICD-10-CM | POA: Diagnosis not present

## 2020-08-02 DIAGNOSIS — Z720 Tobacco use: Secondary | ICD-10-CM | POA: Diagnosis not present

## 2020-08-02 DIAGNOSIS — N529 Male erectile dysfunction, unspecified: Secondary | ICD-10-CM | POA: Diagnosis not present

## 2020-12-01 DIAGNOSIS — R2689 Other abnormalities of gait and mobility: Secondary | ICD-10-CM | POA: Diagnosis not present

## 2020-12-01 DIAGNOSIS — M25561 Pain in right knee: Secondary | ICD-10-CM | POA: Diagnosis not present

## 2020-12-12 ENCOUNTER — Encounter (HOSPITAL_COMMUNITY): Payer: Self-pay

## 2020-12-12 ENCOUNTER — Other Ambulatory Visit: Payer: Self-pay

## 2020-12-12 ENCOUNTER — Ambulatory Visit (HOSPITAL_COMMUNITY)
Admission: EM | Admit: 2020-12-12 | Discharge: 2020-12-12 | Disposition: A | Discharge status: Home / Self Care | Payer: Medicare HMO | Attending: Emergency Medicine | Admitting: Emergency Medicine

## 2020-12-12 ENCOUNTER — Ambulatory Visit (INDEPENDENT_AMBULATORY_CARE_PROVIDER_SITE_OTHER): Payer: Medicare HMO

## 2020-12-12 DIAGNOSIS — M25562 Pain in left knee: Secondary | ICD-10-CM

## 2020-12-12 DIAGNOSIS — M25462 Effusion, left knee: Secondary | ICD-10-CM

## 2020-12-12 DIAGNOSIS — M7989 Other specified soft tissue disorders: Secondary | ICD-10-CM | POA: Diagnosis not present

## 2020-12-12 MED ORDER — KETOROLAC TROMETHAMINE 30 MG/ML IJ SOLN
INTRAMUSCULAR | Status: AC
  Filled 2020-12-12: qty 1

## 2020-12-12 MED ORDER — KETOROLAC TROMETHAMINE 15 MG/ML IJ SOLN
15.0000 mg | Freq: Once | INTRAMUSCULAR | Status: AC
  Administered 2020-12-12: 15 mg via INTRAMUSCULAR

## 2020-12-12 MED ORDER — MELOXICAM 7.5 MG PO TABS: 7.5000 mg | ORAL_TABLET | Freq: Every day | ORAL | 1 refills | Status: AC

## 2020-12-12 MED ORDER — MELOXICAM 7.5 MG PO TABS: 7.5000 mg | ORAL_TABLET | Freq: Every day | ORAL | 1 refills | Status: DC

## 2020-12-12 NOTE — Discharge Instructions (Signed)
Take Meloxicam as directed. Please follow up with ortho.

## 2020-12-12 NOTE — ED Triage Notes (Signed)
Pt presents with swelling and pain in the left knee x 1 week. Pain worsens after using a bike at the gym. Pt has not taken any OTC meds for pain.

## 2020-12-12 NOTE — ED Provider Notes (Signed)
[place]  ____________________________________________  Time seen: Approximately 8:25 PM  I have reviewed the triage vital signs and the nursing notes.   HISTORY  Chief Complaint Knee Pain   Historian Patient    HPI Roy Martinez is a 65 y.o. male presents to the urgent care with acute on chronic left knee pain.  Patient denies recent falls or traumas.  He states he has worsening pain with prolonged ambulation and flexion extension at the left knee.  He states that he has had prior arthroscopies in the past and has had a prior patellar tendon repair.  No numbness or tingling in the left lower extremity.  No swelling of the lower extremity or pleuritic chest pain.  No other alleviating measures have been attempted.   Past Medical History:  Diagnosis Date  . Chronic hepatitis C virus infection ([place])    under treatment June 2016  . H/O ETOH abuse   . Smoker    1/2 ppd     Immunizations up to date:  Yes.     Past Medical History:  Diagnosis Date  . Chronic hepatitis C virus infection ([place])    under treatment June 2016  . H/O ETOH abuse   . Smoker    1/2 ppd    Patient Active Problem List   Diagnosis Date Noted  . Smoker 03/28/2017  . Irregular heart beat 03/28/2017  . Abnormal EKG 03/28/2017  . ETOH abuse 03/28/2017  . Chronic hepatitis C without hepatic coma ([place]) 03/11/2017  . Patellar tendon rupture, right, initial encounter 06/26/2016    Past Surgical History:  Procedure Laterality Date  . KNEE ARTHROSCOPY    . KNEE ARTHROSCOPY WITH MENISCAL REPAIR Right 06/26/2016   Procedure: KNEE ARTHROSCOPY WITH MENISCAL REPAIR VS RESECTION;  Surgeon: Meredith Pel, MD;  Location: [facility];  Service: Orthopedics;  Laterality: Right;  . PATELLAR TENDON REPAIR Right 06/26/2016   Procedure: PATELLA TENDON REPAIR;  Surgeon: Meredith Pel, MD;  Location: [facility];  Service: Orthopedics;  Laterality: Right;    Prior to Admission medications   Medication  Sig Start Date End Date Taking? Authorizing Provider  hydrochlorothiazide (HYDRODIURIL) 25 MG tablet Take 1 tablet (25 mg total) by mouth daily. 05/31/20   Kerin Perna, NP  meloxicam (MOBIC) 7.5 MG tablet Take 1 tablet (7.5 mg total) by mouth daily for 10 days. 12/12/20 12/22/20  Lannie Fields, PA-C  tadalafil (CIALIS) 20 MG tablet Take 0.5-1 tablets (10-20 mg total) by mouth every other day as needed for erectile dysfunction. Patient not taking: No sig reported 06/25/19   Kerin Perna, NP    Allergies Penicillins  Family History  Problem Relation Age of Onset  . Heart failure Mother   . Heart disease Mother   . Heart failure Father   . Arrhythmia Brother   . Colon cancer Neg Hx   . Esophageal cancer Neg Hx   . Rectal cancer Neg Hx   . Stomach cancer Neg Hx     Social History Social History   Tobacco Use  . Smoking status: Current Every Day Smoker    Packs/day: 0.50    Types: Cigarettes  . Smokeless tobacco: Never Used  . Tobacco comment: cutting back  Vaping Use  . Vaping Use: Never used  Substance Use Topics  . Alcohol use: Yes    Alcohol/week: 5.0 - 6.0 standard drinks    Types: 5 - 6 Cans of beer per week    Comment: pt states  daily beer drinker  . Drug use: Yes    Types: "Crack" cocaine    Comment: Quit 4 years ago     Review of Systems  Constitutional: No fever/chills Eyes:  No discharge ENT: No upper respiratory complaints. Respiratory: no cough. No SOB/ use of accessory muscles to breath Gastrointestinal:   No nausea, no vomiting.  No diarrhea.  No constipation. Musculoskeletal: Patient has left knee pain.  Skin: Negative for rash, abrasions, lacerations, ecchymosis.   ____________________________________________   PHYSICAL EXAM:  VITAL SIGNS: ED Triage Vitals  Enc Vitals Group     BP 12/12/20 2007 134/83     Pulse Rate 12/12/20 2007 70     Resp 12/12/20 2007 18     Temp 12/12/20 2007 98.2 F (36.8 C)     Temp Source 12/12/20 2007  Oral     SpO2 12/12/20 2007 99 %     Weight --      Height --      Head Circumference --      Peak Flow --      Pain Score 12/12/20 2006 9     Pain Loc --      Pain Edu? --      Excl. in [place]? --      Constitutional: Alert and oriented. Well appearing and in no acute distress. Eyes: Conjunctivae are normal. PERRL. EOMI. Head: Atraumatic. ENT: Cardiovascular: Normal rate, regular rhythm. Normal S1 and S2.  Good peripheral circulation. Respiratory: Normal respiratory effort without tachypnea or retractions. Lungs CTAB. Good air entry to the bases with no decreased or absent breath sounds Gastrointestinal: Bowel sounds x 4 quadrants. Soft and nontender to palpation. No guarding or rigidity. No distention. Musculoskeletal: Patient has mild effusion of left knee.  He does have pain with flexion extension of the left knee.  The patella does appear high riding.  Palpable dorsalis pedis pulse, left.  Capillary refill is less than 2 seconds on the left. Neurologic:  Normal for age. No gross focal neurologic deficits are appreciated.  Skin:  Skin is warm, dry and intact. No rash noted. Psychiatric: Mood and affect are normal for age. Speech and behavior are normal.   ____________________________________________   LABS (all labs ordered are listed, but only abnormal results are displayed)  Labs Reviewed - No data to display ____________________________________________  EKG   ____________________________________________  RADIOLOGY     DG Knee Complete 4 Views Left  Result Date: 12/12/2020 CLINICAL DATA:  Knee pain, swelling EXAM: LEFT KNEE - COMPLETE 4+ VIEW COMPARISON:  05/28/2016 FINDINGS: The patella is markedly high riding, stable since prior study. Wires seen in the superior pole of the patella. Ossification of the patellar tendon. No fracture, subluxation or dislocation. Joint space narrowing. No joint effusion. IMPRESSION: Negative for acute fracture. Stable markedly high riding  patella which may be related to remote patellar tendon injury. Electronically Signed   By: Rolm Baptise M.D.   On: 12/12/2020 20:31    ____________________________________________    PROCEDURES  Procedure(s) performed:     Procedures     Medications  ketorolac (TORADOL) 15 MG/ML injection 15 mg (has no administration in time range)     ____________________________________________   INITIAL IMPRESSION / ASSESSMENT AND PLAN / ED COURSE  Pertinent labs & imaging results that were available during my care of the patient were reviewed by me and considered in my medical decision making (see chart for details).      Assessment and plan Left knee pain 65 year old  male presents to the urgent care with acute on chronic left knee pain.  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  Patient appears to have a compromised patellar tendon repair as patella is high riding.  Explained to patient results of x-rays.  Patient was given a shot of 15 mg of Toradol IM in the urgent care.  He was discharged with low-dose meloxicam and advised to follow-up with orthopedics.    ____________________________________________  FINAL CLINICAL IMPRESSION(S) / ED DIAGNOSES  Final diagnoses:  Acute pain of left knee      NEW MEDICATIONS STARTED DURING THIS VISIT:  ED Discharge Orders         Ordered    meloxicam (MOBIC) 7.5 MG tablet  Daily,   Status:  Discontinued        12/12/20 2034    meloxicam (MOBIC) 7.5 MG tablet  Daily        12/12/20 2035              This chart was dictated using voice recognition software/Dragon. Despite best efforts to proofread, errors can occur which can change the meaning. Any change was purely unintentional.     Lannie Fields, PA-C 12/12/20 2036

## 2020-12-28 ENCOUNTER — Ambulatory Visit: Payer: Medicare HMO | Admitting: Orthopedic Surgery

## 2021-02-26 ENCOUNTER — Ambulatory Visit (INDEPENDENT_AMBULATORY_CARE_PROVIDER_SITE_OTHER): Payer: Medicare HMO

## 2021-02-26 ENCOUNTER — Encounter (HOSPITAL_COMMUNITY): Payer: Self-pay | Admitting: Emergency Medicine

## 2021-02-26 ENCOUNTER — Other Ambulatory Visit: Payer: Self-pay

## 2021-02-26 ENCOUNTER — Ambulatory Visit (HOSPITAL_COMMUNITY)
Admission: EM | Admit: 2021-02-26 | Discharge: 2021-02-26 | Disposition: A | Discharge status: Home / Self Care | Payer: Medicare HMO | Attending: Physician Assistant | Admitting: Physician Assistant

## 2021-02-26 DIAGNOSIS — M25551 Pain in right hip: Secondary | ICD-10-CM | POA: Diagnosis not present

## 2021-02-26 DIAGNOSIS — T148XXA Other injury of unspecified body region, initial encounter: Secondary | ICD-10-CM

## 2021-02-26 DIAGNOSIS — M545 Low back pain, unspecified: Secondary | ICD-10-CM

## 2021-02-26 DIAGNOSIS — R52 Pain, unspecified: Secondary | ICD-10-CM

## 2021-02-26 DIAGNOSIS — W19XXXA Unspecified fall, initial encounter: Secondary | ICD-10-CM

## 2021-02-26 HISTORY — DX: Essential (primary) hypertension: I10

## 2021-02-26 MED ORDER — TIZANIDINE HCL 4 MG PO CAPS: 4.0000 mg | ORAL_CAPSULE | Freq: Two times a day (BID) | ORAL | 0 refills | Status: DC | PRN

## 2021-02-26 MED ORDER — MUPIROCIN 2 % EX OINT: 1.0000 "application " | TOPICAL_OINTMENT | Freq: Two times a day (BID) | CUTANEOUS | 0 refills | Status: DC

## 2021-02-26 MED ORDER — KETOROLAC TROMETHAMINE 30 MG/ML IJ SOLN
30.0000 mg | Freq: Once | INTRAMUSCULAR | Status: AC
Start: 2021-02-26 — End: 2021-02-26
  Administered 2021-02-26: 30 mg via INTRAMUSCULAR

## 2021-02-26 MED ORDER — KETOROLAC TROMETHAMINE 30 MG/ML IJ SOLN
INTRAMUSCULAR | Status: AC
  Filled 2021-02-26: qty 1
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
## 2021-02-26 NOTE — ED Triage Notes (Signed)
Fell on concrete yesterday.  Complains of right elbow pain with abrasion and right hip pain

## 2021-02-26 NOTE — Discharge Instructions (Signed)
Your x-rays were normal.  I suspect you have muscular injury.  Please alternate Tylenol and ibuprofen over-the-counter and use muscle relaxer (Zanaflex/tizanidine) twice a day as needed.  This medication will make you sleepy so do not drive or drink alcohol with it.  Use a heating pad and stretch for additional symptom relief.  I have called in an ointment for you to use on your skin abrasion.  Please keep this area clean and if you develop any signs of infection please return for reevaluation.  I would recommend following up with your primary care doctor within 1 week to ensure symptom improvement.  If anything worsens please return for reevaluation.

## 2021-04-07 ENCOUNTER — Encounter: Payer: Self-pay | Admitting: Primary Care

## 2021-04-07 DIAGNOSIS — M1990 Unspecified osteoarthritis, unspecified site: Secondary | ICD-10-CM | POA: Diagnosis not present

## 2021-04-07 DIAGNOSIS — Z791 Long term (current) use of non-steroidal anti-inflammatories (NSAID): Secondary | ICD-10-CM | POA: Diagnosis not present

## 2021-04-07 DIAGNOSIS — G8929 Other chronic pain: Secondary | ICD-10-CM | POA: Diagnosis not present

## 2021-04-07 DIAGNOSIS — Z720 Tobacco use: Secondary | ICD-10-CM | POA: Diagnosis not present

## 2021-04-07 DIAGNOSIS — I10 Essential (primary) hypertension: Secondary | ICD-10-CM | POA: Diagnosis not present

## 2021-04-07 DIAGNOSIS — G3184 Mild cognitive impairment, so stated: Secondary | ICD-10-CM | POA: Diagnosis not present

## 2021-04-07 DIAGNOSIS — Z008 Encounter for other general examination: Secondary | ICD-10-CM | POA: Diagnosis not present

## 2021-04-07 DIAGNOSIS — Z8249 Family history of ischemic heart disease and other diseases of the circulatory system: Secondary | ICD-10-CM | POA: Diagnosis not present

## 2021-04-07 DIAGNOSIS — Z880 Allergy status to penicillin: Secondary | ICD-10-CM | POA: Diagnosis not present

## 2021-06-03 ENCOUNTER — Other Ambulatory Visit (INDEPENDENT_AMBULATORY_CARE_PROVIDER_SITE_OTHER): Payer: Self-pay | Admitting: Primary Care

## 2021-06-03 DIAGNOSIS — I10 Essential (primary) hypertension: Secondary | ICD-10-CM

## 2021-06-03 NOTE — Telephone Encounter (Signed)
Requested Prescriptions  Pending Prescriptions Disp Refills  . hydrochlorothiazide (HYDRODIURIL) 25 MG tablet [Pharmacy Med Name: HYDROCHLOROTHIAZIDE '25MG'$  TABLETS] 30 tablet 0    Sig: TAKE 1 TABLET(25 MG) BY MOUTH DAILY     Cardiovascular: Diuretics - Thiazide Failed - 06/03/2021 11:21 AM      Failed - Ca in normal range and within 360 days    Calcium  Date Value Ref Range Status  05/31/2020 8.9 8.6 - 10.2 mg/dL Final         Failed - Cr in normal range and within 360 days    Creat  Date Value Ref Range Status  03/11/2017 0.72 0.70 - 1.25 mg/dL Final    Comment:      For patients > or = 65 years of age: The upper reference limit for Creatinine is approximately 13% higher for people identified as African-American.      Creatinine, Ser  Date Value Ref Range Status  05/31/2020 0.75 (L) 0.76 - 1.27 mg/dL Final         Failed - K in normal range and within 360 days    Potassium  Date Value Ref Range Status  05/31/2020 3.9 3.5 - 5.2 mmol/L Final         Failed - Na in normal range and within 360 days    Sodium  Date Value Ref Range Status  05/31/2020 135 134 - 144 mmol/L Final         Failed - Valid encounter within last 6 months    Recent Outpatient Visits          12 months ago Hearing loss secondary to cerumen impaction, bilateral   [facility] Kerin Perna, NP   1 year ago Need for immunization against influenza   [place], [place], NP   1 year ago Essential hypertension   [place], Michelle P, NP   1 year ago Acute bilateral low back pain, unspecified whether sciatica present   [place], [place], NP   1 year ago Essential hypertension   [place], [place], NP      Future Appointments            In 2 weeks Kerin Perna, NP [place] -  Last BP in normal range    BP Readings from Last 1 Encounters:  02/26/21 123/80

## 2021-06-03 NOTE — Telephone Encounter (Signed)
Reordered today 06/03/21

## 2021-06-04 ENCOUNTER — Emergency Department (HOSPITAL_COMMUNITY)
Admission: EM | Admit: 2021-06-04 | Discharge: 2021-06-05 | Disposition: A | Discharge status: Home / Self Care | Payer: Medicare HMO | Attending: Emergency Medicine | Admitting: Emergency Medicine

## 2021-06-04 ENCOUNTER — Other Ambulatory Visit: Payer: Self-pay

## 2021-06-04 ENCOUNTER — Encounter (HOSPITAL_COMMUNITY): Payer: Self-pay | Admitting: Emergency Medicine

## 2021-06-04 DIAGNOSIS — S199XXA Unspecified injury of neck, initial encounter: Secondary | ICD-10-CM | POA: Diagnosis not present

## 2021-06-04 DIAGNOSIS — Y92410 Unspecified street and highway as the place of occurrence of the external cause: Secondary | ICD-10-CM | POA: Insufficient documentation

## 2021-06-04 DIAGNOSIS — I10 Essential (primary) hypertension: Secondary | ICD-10-CM | POA: Diagnosis not present

## 2021-06-04 DIAGNOSIS — F17210 Nicotine dependence, cigarettes, uncomplicated: Secondary | ICD-10-CM | POA: Diagnosis not present

## 2021-06-04 DIAGNOSIS — J3489 Other specified disorders of nose and nasal sinuses: Secondary | ICD-10-CM | POA: Diagnosis not present

## 2021-06-04 DIAGNOSIS — Z79899 Other long term (current) drug therapy: Secondary | ICD-10-CM | POA: Insufficient documentation

## 2021-06-04 DIAGNOSIS — R519 Headache, unspecified: Secondary | ICD-10-CM | POA: Insufficient documentation

## 2021-06-04 DIAGNOSIS — M542 Cervicalgia: Secondary | ICD-10-CM | POA: Diagnosis not present

## 2021-06-04 DIAGNOSIS — S0990XA Unspecified injury of head, initial encounter: Secondary | ICD-10-CM | POA: Diagnosis not present

## 2021-06-04 DIAGNOSIS — R69 Illness, unspecified: Secondary | ICD-10-CM | POA: Diagnosis not present

## 2021-06-04 DIAGNOSIS — D170 Benign lipomatous neoplasm of skin and subcutaneous tissue of head, face and neck: Secondary | ICD-10-CM | POA: Diagnosis not present
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
## 2021-06-05 ENCOUNTER — Encounter (HOSPITAL_COMMUNITY): Payer: Self-pay | Admitting: Emergency Medicine

## 2021-06-05 ENCOUNTER — Emergency Department (HOSPITAL_COMMUNITY): Payer: Medicare HMO

## 2021-06-05 DIAGNOSIS — S199XXA Unspecified injury of neck, initial encounter: Secondary | ICD-10-CM | POA: Diagnosis not present

## 2021-06-05 DIAGNOSIS — J3489 Other specified disorders of nose and nasal sinuses: Secondary | ICD-10-CM | POA: Diagnosis not present

## 2021-06-05 DIAGNOSIS — S0990XA Unspecified injury of head, initial encounter: Secondary | ICD-10-CM | POA: Diagnosis not present

## 2021-06-05 DIAGNOSIS — D170 Benign lipomatous neoplasm of skin and subcutaneous tissue of head, face and neck: Secondary | ICD-10-CM | POA: Diagnosis not present

## 2021-06-05 MED ORDER — ACETAMINOPHEN 500 MG PO TABS
1000.0000 mg | ORAL_TABLET | Freq: Once | ORAL | Status: AC
  Administered 2021-06-05: 1000 mg via ORAL
  Filled 2021-06-05: qty 2

## 2021-06-05 MED ORDER — IBUPROFEN 800 MG PO TABS
800.0000 mg | ORAL_TABLET | Freq: Once | ORAL | Status: AC
  Administered 2021-06-05: 800 mg via ORAL
  Filled 2021-06-05: qty 1

## 2021-06-05 MED ORDER — NAPROXEN 375 MG PO TABS: 375.0000 mg | ORAL_TABLET | Freq: Two times a day (BID) | ORAL | 0 refills | Status: DC

## 2021-06-05 MED ORDER — LIDOCAINE 5 % EX PTCH
1.0000 | MEDICATED_PATCH | CUTANEOUS | Status: DC
  Administered 2021-06-05: 1 via TRANSDERMAL
  Filled 2021-06-05: qty 1

## 2021-06-05 MED ORDER — LIDOCAINE 5 % EX PTCH: 1.0000 | MEDICATED_PATCH | CUTANEOUS | 0 refills | Status: DC
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
## 2021-06-20 ENCOUNTER — Other Ambulatory Visit: Payer: Self-pay

## 2021-06-20 ENCOUNTER — Encounter (INDEPENDENT_AMBULATORY_CARE_PROVIDER_SITE_OTHER): Payer: Self-pay | Admitting: Primary Care

## 2021-06-20 ENCOUNTER — Ambulatory Visit (INDEPENDENT_AMBULATORY_CARE_PROVIDER_SITE_OTHER): Payer: Medicare HMO | Admitting: Primary Care

## 2021-06-20 VITALS — BP 122/81 | HR 53 | Temp 97.5°F | Ht 73.0 in | Wt 165.8 lb

## 2021-06-20 DIAGNOSIS — F1010 Alcohol abuse, uncomplicated: Secondary | ICD-10-CM | POA: Diagnosis not present

## 2021-06-20 DIAGNOSIS — M7052 Other bursitis of knee, left knee: Secondary | ICD-10-CM

## 2021-06-20 DIAGNOSIS — F17210 Nicotine dependence, cigarettes, uncomplicated: Secondary | ICD-10-CM | POA: Diagnosis not present

## 2021-06-20 DIAGNOSIS — I10 Essential (primary) hypertension: Secondary | ICD-10-CM

## 2021-06-20 DIAGNOSIS — R69 Illness, unspecified: Secondary | ICD-10-CM | POA: Diagnosis not present

## 2021-06-20 DIAGNOSIS — Z760 Encounter for issue of repeat prescription: Secondary | ICD-10-CM

## 2021-06-20 DIAGNOSIS — Z23 Encounter for immunization: Secondary | ICD-10-CM | POA: Diagnosis not present

## 2021-06-20 DIAGNOSIS — Z6821 Body mass index (BMI) 21.0-21.9, adult: Secondary | ICD-10-CM | POA: Diagnosis not present

## 2021-06-20 DIAGNOSIS — F17200 Nicotine dependence, unspecified, uncomplicated: Secondary | ICD-10-CM

## 2021-06-20 MED ORDER — HYDROCHLOROTHIAZIDE 25 MG PO TABS: 25.0000 mg | ORAL_TABLET | Freq: Every day | ORAL | 1 refills | Status: DC

## 2021-06-20 NOTE — Patient Instructions (Addendum)
Influenza, Adult Influenza is also called "the flu." It is an infection in the lungs, nose, and throat (respiratory tract). It spreads easily from person to person (is contagious). The flu causes symptoms that are like a cold, along with high fever and body aches. What are the causes? This condition is caused by the influenza virus. You can get the virus by: Breathing in droplets that are in the air after a person infected with the flu coughed or sneezed. Touching something that has the virus on it and then touching your mouth, nose, or eyes. What increases the risk? Certain things may make you more likely to get the flu. These include: Not washing your hands often. Having close contact with many people during cold and flu season. Touching your mouth, eyes, or nose without first washing your hands. Not getting a flu shot every year. You may have a higher risk for the flu, and serious problems, such as a lung infection (pneumonia), if you: Are older than 65. Are pregnant. Have a weakened disease-fighting system (immune system) because of a disease or because you are taking certain medicines. Have a long-term (chronic) condition, such as: Heart, kidney, or lung disease. Diabetes. Asthma. Have a liver disorder. Are very overweight (morbidly obese). Have anemia. What are the signs or symptoms? Symptoms usually begin suddenly and last 4-14 days. They may include: Fever and chills. Headaches, body aches, or muscle aches. Sore throat. Cough. Runny or stuffy (congested) nose. Feeling discomfort in your chest. Not wanting to eat as much as normal. Feeling weak or tired. Feeling dizzy. Feeling sick to your stomach or throwing up. How is this treated? If the flu is found early, you can be treated with antiviral medicine. This can help to reduce how bad the illness is and how long it lasts. This may be given by mouth or through an IV tube. Taking care of yourself at home can help your  symptoms get better. Your doctor may want you to: Take over-the-counter medicines. Drink plenty of fluids. The flu often goes away on its own. If you have very bad symptoms or other problems, you may be treated in a hospital. Follow these instructions at home:   Activity Rest as needed. Get plenty of sleep. Stay home from work or school as told by your doctor. Do not leave home until you do not have a fever for 24 hours without taking medicine. Leave home only to go to your doctor. Eating and drinking Take an ORS (oral rehydration solution). This is a drink that is sold at pharmacies and stores. Drink enough fluid to keep your pee pale yellow. Drink clear fluids in small amounts as you are able. Clear fluids include: Water. Ice chips. Fruit juice mixed with water. Low-calorie sports drinks. Eat bland foods that are easy to digest. Eat small amounts as you are able. These foods include: Bananas. Applesauce. Rice. Lean meats. Toast. Crackers. Do not eat or drink: Fluids that have a lot of sugar or caffeine. Alcohol. Spicy or fatty foods. General instructions Take over-the-counter and prescription medicines only as told by your doctor. Use a cool mist humidifier to add moisture to the air in your home. This can make it easier for you to breathe. When using a cool mist humidifier, clean it daily. Empty water and replace with clean water. Cover your mouth and nose when you cough or sneeze. Wash your hands with soap and water often and for at least 20 seconds. This is also important after  you cough or sneeze. If you cannot use soap and water, use alcohol-based hand sanitizer. Keep all follow-up visits. How is this prevented?  Get a flu shot every year. You may get the flu shot in late summer, fall, or winter. Ask your doctor when you should get your flu shot. Avoid contact with people who are sick during fall and winter. This is cold and flu season. Contact a doctor if: You get  new symptoms. You have: Chest pain. Watery poop (diarrhea). A fever. Your cough gets worse. You start to have more mucus. You feel sick to your stomach. You throw up. Get help right away if you: Have shortness of breath. Have trouble breathing. Have skin or nails that turn a bluish color. Have very bad pain or stiffness in your neck. Get a sudden headache. Get sudden pain in your face or ear. Cannot eat or drink without throwing up. These symptoms may represent a serious problem that is an emergency. Get medical help right away. Call your local emergency services (911 in the [place] Do not wait to see if the symptoms will go away. Do not drive yourself to the hospital. Summary Influenza is also called "the flu." It is an infection in the lungs, nose, and throat. It spreads easily from person to person. Take over-the-counter and prescription medicines only as told by your doctor. Getting a flu shot every year is the best way to not get the flu. This information is not intended to replace advice given to you by your health care provider. Make sure you discuss any questions you have with your health care provider. Document Revised: 04/29/2020 Document Reviewed: 04/29/2020 Elsevier Patient Education  2022 [place]. High-Protein and High-Calorie Diet Eating high-protein and high-calorie foods can help you to gain weight, heal after an injury, and recover after an illness or surgery. The specific amount of daily protein and calories you need depends on: Your body weight. The reason this diet is recommended for you. Generally, a high-protein, high-calorie diet involves: Eating 250-500 extra calories each day. Making sure that you get enough of your daily calories from protein. Ask your health care provider how many of your calories should come from protein. Talk with a health care provider or a dietitian about how much protein and how many calories you need each day. Follow the diet as  directed by your health care provider. What are tips for following this plan? Reading food labels Check the nutrition facts label for calories, grams of fat and protein. Items with more than 4 grams of protein are high-protein foods. Preparing meals Add whole milk, half-and-half, or heavy cream to cereal, pudding, soup, or hot cocoa. Add whole milk to instant breakfast drinks. Add peanut butter to oatmeal or smoothies. Add powdered milk to baked goods, smoothies, or milkshakes. Add powdered milk, cream, or butter to mashed potatoes. Add cheese to cooked vegetables. Make whole-milk yogurt parfaits. Top them with granola, fruit, or nuts. Add cottage cheese to fruit. Add avocado, cheese, or both to sandwiches or salads. Add avocado to smoothies. Add meat, poultry, or seafood to rice, pasta, casseroles, salads, and soups. Use mayonnaise when making egg salad, chicken salad, or tuna salad. Use peanut butter as a dip for fruits and vegetables or as a topping for pretzels, celery, or crackers. Add beans to casseroles, dips, and spreads. Add pureed beans to sauces and soups. Replace calorie-free drinks with calorie-containing drinks, such as milk and fruit juice. Replace water with milk or heavy cream  when making foods such as oatmeal, pudding, or cocoa. Add oil or butter to cooked vegetables and grains. Add cream cheese to sandwiches or as a topping on crackers and bread. Make cream-based pastas and soups. General information Ask your health care provider if you should take a nutritional supplement. Try to eat six small meals each day instead of three large meals. A general goal is to eat every 2 to 3 hours. Eat a balanced diet. In each meal, include one food that is high in protein and one food with fat in it. Keep nutritious snacks available, such as nuts, trail mixes, dried fruit, and yogurt. If you have kidney disease or diabetes, talk with your health care provider about how much protein  is safe for you. Too much protein may put extra stress on your kidneys. Drink your calories. Choose high-calorie drinks and have them after your meals. Consider setting a timer to remind you to eat. You will want to eat even if you do not feel very hungry. What high-protein foods should I eat? Vegetables Soybeans. Peas. Grains Quinoa. Bulgur wheat. Buckwheat. Meats and other proteins Beef, pork, and poultry. Fish and seafood. Eggs. Tofu. Textured vegetable protein (TVP). Peanut butter. Nuts and seeds. Dried beans. Protein powders. Hummus. Dairy Whole milk. Whole-milk yogurt. Powdered milk. Cheese. [employer]. Eggnog. Beverages High-protein supplement drinks. Soy milk. Other foods Protein bars. The items listed above may not be a complete list of foods and beverages you can eat and drink. Contact a dietitian for more information. What high-calorie foods should I eat? Fruits Dried fruit. Fruit leather. Canned fruit in syrup. Fruit juice. Avocado. Vegetables Vegetables cooked in oil or butter. Fried potatoes. Grains Pasta. Quick breads. Muffins. Pancakes. Ready-to-eat cereal. Meats and other proteins Peanut butter. Nuts and seeds. Dairy Heavy cream. Whipped cream. Cream cheese. Sour cream. Ice cream. Custard. Pudding. Whole milk dairy products. Beverages Meal-replacement beverages. Nutrition shakes. Fruit juice. Seasonings and condiments Salad dressing. Mayonnaise. Alfredo sauce. Fruit preserves or jelly. Honey. Syrup. Sweets and desserts Cake. Cookies. Pie. Pastries. Candy bars. Chocolate. Fats and oils Butter or margarine. Oil. Gravy. Other foods Meal-replacement bars. The items listed above may not be a complete list of foods and beverages you can eat and drink. Contact a dietitian for more information. Summary A high-protein, high-calorie diet can help you gain weight or heal faster after an injury, illness, or surgery. To increase your protein and calories, add  ingredients such as whole milk, peanut butter, cheese, beans, meat, or seafood to meal items. To get enough extra calories each day, include high-calorie foods and beverages at each meal. Adding a high-calorie drink or shake can be an easy way to help you get enough calories each day. Talk with your healthcare provider or dietitian about the best options for you. This information is not intended to replace advice given to you by your health care provider. Make sure you discuss any questions you have with your health care provider. Document Revised: 08/14/2020 Document Reviewed: 08/14/2020 Elsevier Patient Education  2022 [employer]. .meFinding Treatment for Addiction Addiction is a complex disease of the brain that causes an uncontrollable (compulsive) need for: A substance. This includes alcohol, illegal drugs, or prescription medicines, such as painkillers. An activity or behavior, such as gambling or shopping. Addiction changes the way your brain works. Because of this change: The need for the medicine, drug, or activity can become so strong that you think about it all the time. Getting more and more of your  addiction becomes the most important thing to you. You may find yourself leaving other activities and relationships to pursue your addiction. You can become physically dependent on a substance. Your health, behavior, emotions, and relationships can change for the worse. How do I know if I need treatment for addiction? Addiction is a progressive disease. Without treatment, addiction can get worse. Living with addiction puts you at higher risk for injury, poor health, loss of employment, loss of money, and even death. You might need treatment for addiction if: You have tried to stop or cut down, but you have not succeeded. You find it annoying that your friends and family are concerned about your use or behavior. You feel guilty about your use or behavior. You need a particular  substance or activity to start your day or to calm down. You are running out of money because of your addiction. You have done something illegal to support your addiction. Your addiction has caused you: Health problems. Trouble in school, work, home, or with the police. To devote all your time to your addiction, and not to other responsibilities. To tell lies in order to hide your problem. What types of treatment are available? There may be options for treatment programs and plans based on your addiction, condition, needs, and preferences. No single treatment is right for everyone. Treatment programs can be: Outpatient. You live at home and go to work or school, but you go to a clinic for treatment. Inpatient. You live and sleep at the program facility during treatment. Programs may include: Medicine. You may need medicine to treat the addiction itself, or to treat anxiety or depression. Counseling and behavior therapy. This can help individuals and families behave in healthier ways and relate more effectively. Support groups. Confidential group therapy, such as a 12-step program, can help individuals and families during treatment and recovery. A combination of education, counseling, and a 12-step, spirituality-based approach. What should I consider when selecting a treatment program? Think about your individual requirements when selecting a treatment program. Ask about: The overall approach to treatment. Some programs are strictly 12-step programs. Some have a more flexible approach. Programs may differ in length of stay, setting, and size. Some programs include your family in your treatment plan. Support may be offered to them throughout the treatment process, as well as instructions for them when you are discharged. You may continue to receive support after you have left the program. The types of medical services that are offered. Find out if the program: Offers specific treatment for  your particular addiction. Meets all of your needs, including physical and cultural needs. Includes any medicines you might need. Offers mental health counseling as part of your treatment. Offers the 12-step meetings at the center, or if transport is available for patients to attend meetings at other locations. The cost and types of insurance that are accepted. Some programs are sponsored by the government. They support patients who do not have private insurance. If you do not have insurance, or if you choose to attend a program that does not accept your insurance, call the treatment center. Tell them your financial needs and whether a payment plan can be set up. There are also organizations that will help you find the resources for treatment. You can find them online by searching "treatment for addiction." If the program is certified by the appropriate government agency. Where to find support Your health care provider can help you to find the right treatment. These discussions are confidential.  The [employer] on Alcoholism and Drug Dependence (NCADD). This group has information about treatment centers and programs for people who have an addiction and for family members. Call: 1-800-NCA-CALL (367-800-8111). Visit the website: https://www.ncadd.org/ The [facility]). This organization will help you find publicly funded treatment centers, help hotlines, and counseling services near you. Call: 1-800-662-HELP (754)551-2659). Visit the website: www.findtreatment.SamedayNews.com.cy The National Problem Gambling Helpline. This is a 24-hour confidential helpline for gambling addiction. Call: 4342062381 Visit the website: http://wiggins.com/ In countries outside of the [place] and [place], look in [employer] for contact information for services in your area. Follow these instructions at home: Find supportive people who will help  you stay away from your addiction and stay sober. Do not use the substance or engage in the activity. If you have been through treatment: Follow your plan. The plan is usually developed by you and your health care provider during treatment. Go to meetings with other people in recovery. Avoid people, situations, and things that lead you to do the things you are addicted to (triggers). Summary Addiction changes the way your brain works. These changes cause a desire to repeat and increase the use of the substance or behavior. Addiction is a progressive disease. Without treatment, addiction can get worse. Living with addiction puts you at higher risk for injury, poor health, loss of employment, loss of money, and even death. There may be options for treatment programs and plans based on your addiction, condition, needs, and preferences. No single treatment is right for everyone. Your health care provider can help you to find the right treatment. These discussions are confidential. This information is not intended to replace advice given to you by your health care provider. Make sure you discuss any questions you have with your health care provider. Document Revised: 09/08/2020 Document Reviewed: 10/09/2017 Elsevier Patient Education  2022 [employer].

## 2021-06-20 NOTE — Progress Notes (Signed)
Left knee pain Right knee has knot on it

## 2021-06-20 NOTE — Progress Notes (Signed)
[facility]   [name] is a 65 y.o. male presents for hypertension evaluation, Denies shortness of breath, headaches, chest pain or lower extremity edema, sudden onset, vision changes, unilateral weakness, dizziness, paresthesias  Concern with decrease of appetite and weight loss reviewed weights and he has lost 11 lbs in 1 year. Patient reports adherence with medications.  Dietary habits include: healthy  Exercise habits include:walk  Family / Social history: Father (MI)  Past Medical History:  Diagnosis Date   Chronic hepatitis C virus infection ([place])    under treatment June 2016   H/O ETOH abuse    Hypertension    Smoker    1/2 ppd   Past Surgical History:  Procedure Laterality Date   KNEE ARTHROSCOPY     KNEE ARTHROSCOPY WITH MENISCAL REPAIR Right 06/26/2016   Procedure: KNEE ARTHROSCOPY WITH MENISCAL REPAIR VS RESECTION;  Surgeon: Meredith Pel, MD;  Location: [place];  Service: Orthopedics;  Laterality: Right;   PATELLAR TENDON REPAIR Right 06/26/2016   Procedure: PATELLA TENDON REPAIR;  Surgeon: Meredith Pel, MD;  Location: [place];  Service: Orthopedics;  Laterality: Right;   Allergies  Allergen Reactions   Penicillins Hives    Childhood allergy Has patient had a PCN reaction causing immediate rash, facial/tongue/throat swelling, SOB or lightheadedness with hypotension: No Has patient had a PCN reaction causing severe rash involving mucus membranes or skin necrosis: No Has patient had a PCN reaction that required hospitalization No Has patient had a PCN reaction occurring within the last 10 years: No If all of the above answers are "NO", then may proceed with Cephalosporin use.    Current Outpatient Medications on File Prior to Visit  Medication Sig Dispense Refill   hydrochlorothiazide (HYDRODIURIL) 25 MG tablet TAKE 1 TABLET(25 MG) BY MOUTH DAILY 30 tablet 0   lidocaine (LIDODERM) 5 % Place 1 patch onto the skin daily. Remove &  Discard patch within 12 hours or as directed by MD 30 patch 0   naproxen (NAPROSYN) 375 MG tablet Take 1 tablet (375 mg total) by mouth 2 (two) times daily. 14 tablet 0   tadalafil (CIALIS) 20 MG tablet Take 0.5-1 tablets (10-20 mg total) by mouth every other day as needed for erectile dysfunction. (Patient not taking: No sig reported) 5 tablet 1   No current facility-administered medications on file prior to visit.   Social History   Socioeconomic History   Marital status: Single    Spouse name: Not on file   Number of children: Not on file   Years of education: Not on file   Highest education level: Not on file  Occupational History   Not on file  Tobacco Use   Smoking status: Every Day    Packs/day: 0.50    Types: Cigarettes   Smokeless tobacco: Never   Tobacco comments:    cutting back  Vaping Use   Vaping Use: Never used  Substance and Sexual Activity   Alcohol use: Yes    Alcohol/week: 5.0 - 6.0 standard drinks    Types: 5 - 6 Cans of beer per week    Comment: pt states daily beer drinker   Drug use: Yes    Types: "Crack" cocaine    Comment: Quit 4 years ago   Sexual activity: Never  Other Topics Concern   Not on file  Social History Narrative   ** Merged History Encounter **       Social Determinants of Health   Financial  Resource Strain: Not on file  Food Insecurity: Not on file  Transportation Needs: Not on file  Physical Activity: Not on file  Stress: Not on file  Social Connections: Not on file  Intimate Partner Violence: Not on file   Family History  Problem Relation Age of Onset   Heart failure Mother    Heart disease Mother    Heart failure Father    Arrhythmia Brother    Colon cancer Neg Hx    Esophageal cancer Neg Hx    Rectal cancer Neg Hx    Stomach cancer Neg Hx      OBJECTIVE:  Vitals:   06/20/21 1432  BP: 122/81  Pulse: (!) 53  Temp: (!) 97.5 F (36.4 C)  TempSrc: Temporal  SpO2: 99%  Weight: 165 lb 12.8 oz (75.2 kg)   Height: 6\' 1"  (1.854 m)    Physical Exam Vitals reviewed.  Constitutional:      Comments: Body mass index is 21.87 kg/m. < Normal weight    HENT:     Head: Normocephalic.     Right Ear: Tympanic membrane normal.     Left Ear: Tympanic membrane normal.     Nose: Nose normal.  Eyes:     Extraocular Movements: Extraocular movements intact.     Pupils: Pupils are equal, round, and reactive to light.  Cardiovascular:     Rate and Rhythm: Normal rate and regular rhythm.  Pulmonary:     Effort: Pulmonary effort is normal.     Breath sounds: Normal breath sounds.  Abdominal:     General: Abdomen is flat. Bowel sounds are normal.     Palpations: Abdomen is soft.  Musculoskeletal:        General: Normal range of motion.     Cervical back: Normal range of motion.  Skin:    General: Skin is warm and dry.  Neurological:     Mental Status: He is alert and oriented to person, place, and time.  Psychiatric:        Mood and Affect: Mood normal.        Behavior: Behavior normal.        Thought Content: Thought content normal.        Judgment: Judgment normal.     Review of Systems  Musculoskeletal:        Right knee pain bursae vs    Psychiatric/Behavioral:  Positive for depression and substance abuse.        2 (32 oz of beer and 1/2 pack of cigarette   All other systems reviewed and are negative.  Last 3 Office BP readings: BP Readings from Last 3 Encounters:  06/20/21 122/81  06/05/21 (!) 144/90  02/26/21 123/80    BMET    Component Value Date/Time   NA 135 05/31/2020 1128   K 3.9 05/31/2020 1128   CL 98 05/31/2020 1128   CO2 22 05/31/2020 1128   GLUCOSE 94 05/31/2020 1128   GLUCOSE 113 (H) 12/28/2017 1426   BUN 14 05/31/2020 1128   CREATININE 0.75 (L) 05/31/2020 1128   CREATININE 0.72 03/11/2017 0953   CALCIUM 8.9 05/31/2020 1128   GFRNONAA 98 05/31/2020 1128   GFRNONAA >89 03/11/2017 0953   GFRAA 113 05/31/2020 1128   GFRAA >89 03/11/2017 0953    Renal  function: CrCl cannot be calculated (Patient's most recent lab result is older than the maximum 21 days allowed.).  Clinical ASCVD: Yes  The 10-year ASCVD risk score (Arnett DK, et al., 2019)  is: 20.4%   Values used to calculate the score:     Age: 52 years     Sex: Male     Is Non-Hispanic African American: Yes     Diabetic: No     Tobacco smoker: Yes     Systolic Blood Pressure: 939 mmHg     Is BP treated: Yes     HDL Cholesterol: 78 mg/dL     Total Cholesterol: 189 mg/dL  ASCVD risk factors include- [name]  Diagnoses and all orders for this visit:  ASSESSMENT & PLAN: Essential hypertension -Counseled on lifestyle modifications for blood pressure control including reduced dietary sodium, increased exercise, weight reduction and adequate sleep. Also, educated patient about the risk for cardiovascular events, stroke and heart attack. Also counseled patient about the importance of medication adherence. If you participate in smoking, it is important to stop using tobacco as this will increase the risks associated with uncontrolled blood pressure.   -Hypertension longstanding diagnosed currently HCTZ 25mg   on current medications. Patient is adherent with current medications.   Goal BP:  For patients younger than 60: Goal BP < 130/80. For patients 60 and older: Goal BP < 140/90. For patients with diabetes: Goal BP < 130/80. Your most recent BP: 122/81   Minimize salt intake. Minimize alcohol intake Jarmar was seen today for hypertension and medication refill.  Need for immunization against influenza -     Flu Vaccine QUAD 67mo+IM (Fluarix, Fluzone & Alfiuria Quad PF)  ETOH abuse Does not accept as a problem. Getting empty calories not substantial  nutrients   Smoker - I have recommended complete cessation of tobacco use. I have discussed various options available for assistance with tobacco cessation including over the counter methods (Nicotine gum, patch and lozenges). We also  discussed prescription options (Chantix, Nicotine Inhaler / Nasal Spray). The patient is not interested in pursuing any prescription tobacco cessation options at this time. - Patient declines at this time.   Bursitis of left knee, unspecified bursa -     Ambulatory referral to Orthopedic Surgery  Medication refill HCTZ 25mg  daily  Body mass index (BMI) 21.0-21.9, adult < 25 BMI below normal   This note has been created with Surveyor, quantity. Any transcriptional errors are unintentional.   Kerin Perna, NP 06/20/2021, 3:14 PM

## 2021-06-22 ENCOUNTER — Other Ambulatory Visit: Payer: Self-pay

## 2021-06-22 ENCOUNTER — Other Ambulatory Visit: Payer: Self-pay | Admitting: Surgical

## 2021-06-22 ENCOUNTER — Ambulatory Visit: Payer: Self-pay

## 2021-06-22 ENCOUNTER — Ambulatory Visit (INDEPENDENT_AMBULATORY_CARE_PROVIDER_SITE_OTHER): Payer: Medicare HMO | Admitting: Orthopedic Surgery

## 2021-06-22 ENCOUNTER — Encounter: Payer: Self-pay | Admitting: Orthopedic Surgery

## 2021-06-22 DIAGNOSIS — M25562 Pain in left knee: Secondary | ICD-10-CM

## 2021-06-22 DIAGNOSIS — S86812S Strain of other muscle(s) and tendon(s) at lower leg level, left leg, sequela: Secondary | ICD-10-CM

## 2021-06-22 MED ORDER — MELOXICAM 15 MG PO TABS: 15.0000 mg | ORAL_TABLET | Freq: Every day | ORAL | 0 refills | Status: DC

## 2021-06-22 NOTE — Progress Notes (Signed)
Office Visit Note   Patient: Roy Martinez           Date of Birth: Aug 07, 1956           MRN: [id number] Visit Date: 06/22/2021 Requested by: Kerin Perna, NP [address] [place],  [place] [address] PCP: Kerin Perna, NP  Subjective: Chief Complaint  Patient presents with   Left Knee - Pain    HPI: Roy Martinez is a 65 y.o. male who presents to the office complaining of left knee swelling and discomfort.  Patient has history of patellar tendon rupture with subsequent repair of right and left knees.  Right knee was repaired by Dr. Marlou Sa several years ago with left knee tendon repair by a different physician.  He had failure of the left knee repair with subsequent proximal migration of the left patella and this is a chronic issue for him.  He denies any recent injury.  He notes pain with squats and bending.  Pain does not wake him up at night.  He does have some area of swelling and pain primarily in the anterior aspect of the left knee.  He has tried Advil and Tylenol without much relief.  He has not tried any topicals.  He is not interested in any surgical solution..                ROS: All systems reviewed are negative as they relate to the chief complaint within the history of present illness.  Patient denies fevers or chills.  Assessment & Plan: Visit Diagnoses:  1. Patellar tendon rupture, left, sequela   2. Acute pain of left knee     Plan: Patient is a 65 year old male who presents complaint of left knee pain.  He has history of chronic left knee patellar tendon rupture.  He has had failed repair several years ago.  Radiographs show proximal migration of the left patella with calcifications from the inferior patellar sleeve still in position.  He does have some extensor mechanism that is still intact but he is only able to perform straight leg raise with extensor lag of 15 degrees.  Discussed options available to patient.  Suspect that any surgical management of this  has potential to make his function worse and interfere with the lextensor mechanism that he currently has.  Recommended nonweightbearing quad strengthening exercises as well as prescribing Mobic today.  He may try topical Voltaren as well.  Follow-up with the office in 6 to 8 weeks if he has no improvement.  Patient is not interested in surgery.  He is currently functional despite the disruption in bilateral extensor mechanisms.  Follow-Up Instructions: No follow-ups on file.   Orders:  Orders Placed This Encounter  Procedures   XR Knee 1-2 Views Left   Meds ordered this encounter  Medications   meloxicam (MOBIC) 15 MG tablet    Sig: Take 1 tablet (15 mg total) by mouth daily.    Dispense:  30 tablet    Refill:  0      Procedures: No procedures performed   Clinical Data: No additional findings.  Objective: Vital Signs: There were no vitals taken for this visit.  Physical Exam:  Constitutional: Patient appears well-developed HEENT:  Head: Normocephalic Eyes:EOM are normal Neck: Normal range of motion Cardiovascular: Normal rate Pulmonary/chest: Effort normal Neurologic: Patient is alert Skin: Skin is warm Psychiatric: Patient has normal mood and affect  Ortho Exam: Ortho exam demonstrates left knee with 5 degrees extension passively and  115 degrees of knee flexion passively.  Active extension intact with 15 degree extensor lag.  No calf tenderness.  Negative Homans' sign.  Proximal retraction of the patella with palpable scar tissue and bone fragments in the previous patellar position.  This is slightly tender.  No joint line tenderness.  Specialty Comments:  No specialty comments available.  Imaging: No results found.   PMFS History: Patient Active Problem List   Diagnosis Date Noted   Smoker 03/28/2017   Irregular heart beat 03/28/2017   Abnormal EKG 03/28/2017   ETOH abuse 03/28/2017   Chronic hepatitis C without hepatic coma ([place]) 03/11/2017   Patellar  tendon rupture, right, initial encounter 06/26/2016   Past Medical History:  Diagnosis Date   Chronic hepatitis C virus infection ([place])    under treatment June 2016   H/O ETOH abuse    Hypertension    Smoker    1/2 ppd    Family History  Problem Relation Age of Onset   Heart failure Mother    Heart disease Mother    Heart failure Father    Arrhythmia Brother    Colon cancer Neg Hx    Esophageal cancer Neg Hx    Rectal cancer Neg Hx    Stomach cancer Neg Hx     Past Surgical History:  Procedure Laterality Date   KNEE ARTHROSCOPY     KNEE ARTHROSCOPY WITH MENISCAL REPAIR Right 06/26/2016   Procedure: KNEE ARTHROSCOPY WITH MENISCAL REPAIR VS RESECTION;  Surgeon: Meredith Pel, MD;  Location: [place];  Service: Orthopedics;  Laterality: Right;   PATELLAR TENDON REPAIR Right 06/26/2016   Procedure: PATELLA TENDON REPAIR;  Surgeon: Meredith Pel, MD;  Location: [place];  Service: Orthopedics;  Laterality: Right;   Social History   Occupational History   Not on file  Tobacco Use   Smoking status: Every Day    Packs/day: 0.50    Types: Cigarettes   Smokeless tobacco: Never   Tobacco comments:    cutting back  Vaping Use   Vaping Use: Never used  Substance and Sexual Activity   Alcohol use: Yes    Alcohol/week: 5.0 - 6.0 standard drinks    Types: 5 - 6 Cans of beer per week    Comment: pt states daily beer drinker   Drug use: Yes    Types: "Crack" cocaine    Comment: Quit 4 years ago   Sexual activity: Never

## 2021-06-26 ENCOUNTER — Encounter: Payer: Self-pay | Admitting: Orthopedic Surgery

## 2021-09-19 ENCOUNTER — Ambulatory Visit (INDEPENDENT_AMBULATORY_CARE_PROVIDER_SITE_OTHER): Payer: Medicare HMO | Admitting: Primary Care

## 2021-09-27 ENCOUNTER — Encounter (INDEPENDENT_AMBULATORY_CARE_PROVIDER_SITE_OTHER): Payer: Self-pay | Admitting: Primary Care

## 2021-09-27 ENCOUNTER — Ambulatory Visit (INDEPENDENT_AMBULATORY_CARE_PROVIDER_SITE_OTHER): Payer: Medicare HMO | Admitting: Primary Care

## 2021-09-27 ENCOUNTER — Other Ambulatory Visit: Payer: Self-pay

## 2021-09-27 VITALS — BP 119/80 | HR 59 | Temp 97.5°F | Ht 73.0 in | Wt 167.0 lb

## 2021-09-27 DIAGNOSIS — Z23 Encounter for immunization: Secondary | ICD-10-CM | POA: Diagnosis not present

## 2021-09-27 DIAGNOSIS — I10 Essential (primary) hypertension: Secondary | ICD-10-CM | POA: Diagnosis not present

## 2021-09-27 DIAGNOSIS — M25561 Pain in right knee: Secondary | ICD-10-CM | POA: Diagnosis not present

## 2021-09-27 DIAGNOSIS — F1010 Alcohol abuse, uncomplicated: Secondary | ICD-10-CM | POA: Diagnosis not present

## 2021-09-27 DIAGNOSIS — R69 Illness, unspecified: Secondary | ICD-10-CM | POA: Diagnosis not present

## 2021-09-27 NOTE — Patient Instructions (Signed)
° °  FOOD for Thought  What does the abuse of alcohol cause? Over time, excessive alcohol use can lead to the development of chronic diseases and other serious problems including: High blood pressure, heart disease, stroke, liver disease, and digestive problems. Cancer of the breast, mouth, throat, esophagus, voice box, liver, colon, and rectum

## 2021-09-27 NOTE — Progress Notes (Signed)
[facility]   Roy Martinez is a 66 y.o. male presents for hypertension evaluation, Denies shortness of breath, headaches, chest pain or lower extremity edema, sudden onset, vision changes, unilateral weakness, dizziness, paresthesias . Today he complains of a fall and right knee pain. Pain 5/10.Also, concern about weight loss. Drinks Ensure- explained not repeating 22- ALCOHOL is empty calories  Patient reports adherence with medications.  Dietary habits include: poor appetite secondary to alcohol Exercise habits include:no Family / Social history: Father -MI   Past Medical History:  Diagnosis Date   Chronic hepatitis C virus infection ([place])    under treatment June 2016   H/O ETOH abuse    Hypertension    Smoker    1/2 ppd   Past Surgical History:  Procedure Laterality Date   KNEE ARTHROSCOPY     KNEE ARTHROSCOPY WITH MENISCAL REPAIR Right 06/26/2016   Procedure: KNEE ARTHROSCOPY WITH MENISCAL REPAIR VS RESECTION;  Surgeon: Meredith Pel, MD;  Location: [place];  Service: Orthopedics;  Laterality: Right;   PATELLAR TENDON REPAIR Right 06/26/2016   Procedure: PATELLA TENDON REPAIR;  Surgeon: Meredith Pel, MD;  Location: [place];  Service: Orthopedics;  Laterality: Right;   Allergies  Allergen Reactions   Penicillins Hives    Childhood allergy Has patient had a PCN reaction causing immediate rash, facial/tongue/throat swelling, SOB or lightheadedness with hypotension: No Has patient had a PCN reaction causing severe rash involving mucus membranes or skin necrosis: No Has patient had a PCN reaction that required hospitalization No Has patient had a PCN reaction occurring within the last 10 years: No If all of the above answers are "NO", then may proceed with Cephalosporin use.    Current Outpatient Medications on File Prior to Visit  Medication Sig Dispense Refill   hydrochlorothiazide (HYDRODIURIL) 25 MG tablet Take 1 tablet (25 mg total) by mouth  daily. 90 tablet 1   No current facility-administered medications on file prior to visit.   Social History   Socioeconomic History   Marital status: Single    Spouse name: Not on file   Number of children: Not on file   Years of education: Not on file   Highest education level: Not on file  Occupational History   Not on file  Tobacco Use   Smoking status: Every Day    Packs/day: 0.50    Types: Cigarettes   Smokeless tobacco: Never   Tobacco comments:    cutting back  Vaping Use   Vaping Use: Never used  Substance and Sexual Activity   Alcohol use: Yes    Alcohol/week: 5.0 - 6.0 standard drinks    Types: 5 - 6 Cans of beer per week    Comment: pt states daily beer drinker   Drug use: Yes    Types: "Crack" cocaine    Comment: Quit 4 years ago   Sexual activity: Never  Other Topics Concern   Not on file  Social History Narrative   ** Merged History Encounter **       Social Determinants of Health   Financial Resource Strain: Not on file  Food Insecurity: Not on file  Transportation Needs: Not on file  Physical Activity: Not on file  Stress: Not on file  Social Connections: Not on file  Intimate Partner Violence: Not on file   Family History  Problem Relation Age of Onset   Heart failure Mother    Heart disease Mother    Heart failure Father  Arrhythmia Brother    Colon cancer Neg Hx    Esophageal cancer Neg Hx    Rectal cancer Neg Hx    Stomach cancer Neg Hx      OBJECTIVE:  Vitals:   09/27/21 1449  BP: 119/80  Pulse: (!) 59  Temp: (!) 97.5 F (36.4 C)  TempSrc: Temporal  SpO2: 98%  Weight: 167 lb (75.8 kg)  Height: 6\' 1"  (1.854 m)     Physical exam: General: Vital signs reviewed.  Patient is well-developed and poorly nourished, Body mass index is 22.03 kg/m. Male in no acute distress and cooperative with exam. Head: Normocephalic and atraumatic. Eyes: EOMI, conjunctivae normal, no scleral icterus. Neck: Supple, trachea midline, normal  ROM, no JVD, masses, thyromegaly, or carotid bruit present. Cardiovascular: RRR, S1 normal, S2 normal, no murmurs, gallops, or rubs. Pulmonary/Chest: Clear to auscultation bilaterally, no wheezes, rales, or rhonchi. Abdominal: Soft, non-tender, non-distended, BS +, no masses, organomegaly, or guarding present. Musculoskeletal: No joint deformities, erythema, or stiffness, ROM full and nontender. Extremities: No lower extremity edema bilaterally,  pulses symmetric and intact bilaterally. No cyanosis or clubbing. Neurological: A&O x3, Strength is normal Skin: Warm, dry and intact. No rashes or erythema. Psychiatric: Normal mood and affect. speech and behavior is normal. Cognition and memory are normal.     ROS Comprehensive noted pertinent positive and negative in HPI Last 3 Office BP readings: BP Readings from Last 3 Encounters:  09/27/21 119/80  06/20/21 122/81  06/05/21 (!) 144/90    BMET    Component Value Date/Time   NA 135 05/31/2020 1128   K 3.9 05/31/2020 1128   CL 98 05/31/2020 1128   CO2 22 05/31/2020 1128   GLUCOSE 94 05/31/2020 1128   GLUCOSE 113 (H) 12/28/2017 1426   BUN 14 05/31/2020 1128   CREATININE 0.75 (L) 05/31/2020 1128   CREATININE 0.72 03/11/2017 0953   CALCIUM 8.9 05/31/2020 1128   GFRNONAA 98 05/31/2020 1128   GFRNONAA >89 03/11/2017 0953   GFRAA 113 05/31/2020 1128   GFRAA >89 03/11/2017 0953    Renal function: CrCl cannot be calculated (Patient's most recent lab result is older than the maximum 21 days allowed.).  Clinical ASCVD: Yes  The 10-year ASCVD risk score (Arnett DK, et al., 2019) is: 20.3%   Values used to calculate the score:     Age: 68 years     Sex: Male     Is Non-Hispanic African American: Yes     Diabetic: No     Tobacco smoker: Yes     Systolic Blood Pressure: 106 mmHg     Is BP treated: Yes     HDL Cholesterol: 78 mg/dL     Total Cholesterol: 189 mg/dL  ASCVD risk factors include- Roy Martinez was seen today for  hypertension and ethol .  Diagnoses and all orders for this visit:  Need for prophylactic vaccination against Streptococcus pneumoniae (pneumococcus) -     Pneumococcal conjugate vaccine 20-valent (Prevnar 20)  Acute pain of right knee Mobic 15mg  90 day supply provided by ED s/p fall. May alternate with heat and ice application for pain relief.   ETOH abuse Still working on it is not interested in treatment center or Group therapy  Essential hypertension -Counseled on lifestyle modifications for blood pressure control including reduced dietary sodium, increased exercise, weight reduction and adequate sleep. Also, educated patient about the risk for cardiovascular events, stroke and heart attack. Also counseled patient about the importance of medication adherence. If you  participate in smoking, it is important to stop using tobacco as this will increase the risks associated with uncontrolled blood pressure.   -Hypertension longstanding diagnosed currently HCTZ 25mg  daily  on current medications. Patient is adherent with current medications.   Goal BP:  For patients younger than 60: Goal BP < 130/80. For patients 60 and older: Goal BP < 140/90. For patients with diabetes: Goal BP < 130/80. Your most recent BP: 119/80   Minimize salt intake. Minimize alcohol intake    This note has been created with Surveyor, quantity. Any transcriptional errors are unintentional.   Kerin Perna, NP 09/27/2021, 3:14 PM

## 2021-12-25 ENCOUNTER — Ambulatory Visit (INDEPENDENT_AMBULATORY_CARE_PROVIDER_SITE_OTHER): Payer: Medicare HMO | Admitting: Primary Care

## 2021-12-25 ENCOUNTER — Encounter (INDEPENDENT_AMBULATORY_CARE_PROVIDER_SITE_OTHER): Payer: Self-pay | Admitting: Primary Care

## 2021-12-25 VITALS — BP 123/65 | HR 59 | Temp 97.7°F | Ht 73.0 in | Wt 165.8 lb

## 2021-12-25 DIAGNOSIS — Z131 Encounter for screening for diabetes mellitus: Secondary | ICD-10-CM

## 2021-12-25 DIAGNOSIS — F1010 Alcohol abuse, uncomplicated: Secondary | ICD-10-CM

## 2021-12-25 DIAGNOSIS — I10 Essential (primary) hypertension: Secondary | ICD-10-CM | POA: Diagnosis not present

## 2021-12-25 DIAGNOSIS — R5383 Other fatigue: Secondary | ICD-10-CM | POA: Diagnosis not present

## 2021-12-25 DIAGNOSIS — R69 Illness, unspecified: Secondary | ICD-10-CM | POA: Diagnosis not present

## 2021-12-25 LAB — POCT GLYCOSYLATED HEMOGLOBIN (HGB A1C): Hemoglobin A1C: 5.2 % (ref 4.0–5.6)

## 2021-12-25 MED ORDER — HYDROCHLOROTHIAZIDE 12.5 MG PO TABS: 25.0000 mg | ORAL_TABLET | Freq: Every day | ORAL | 0 refills | Status: DC

## 2021-12-25 NOTE — Patient Instructions (Signed)
Alcohol Use Disorder ?Alcohol use disorder is a condition in which drinking disrupts daily life. People with this condition drink too much alcohol and cannot control their drinking. ?Alcohol use disorder can cause serious problems with physical health. It can affect the brain, heart, and other internal organs. This disorder can raise the risk for certain cancers and cause problems with mental health, such as depression or anxiety. ?What are the causes? ?This condition is caused by drinking too much alcohol over time. Some people with this condition drink to cope with or escape from negative life events. Others drink to relieve pain or symptoms of mental illness. ?What increases the risk? ?You are more likely to develop this condition if: ?You have a family history of alcohol use disorder. ?Your culture encourages drinking to the point of becoming drunk (intoxication). ?You had a mood or conduct disorder in childhood. ?You have been abused. ?You are an adolescent and you: ?Have poor performance in school. ?Have poor supervision or guidance. ?Act on impulse and like taking risks. ?What are the signs or symptoms? ?Symptoms of this condition include: ?Drinking more than you want to. ?Trying several times without success to drink less. ?Spending a lot of time thinking about alcohol, getting alcohol, drinking, or recovering from drinking. ?Continuing to drink even when it is causing serious problems in your daily life. ?Drinking when it is dangerous to drink, such as before driving a car. ?Needing more and more alcohol to get the same effect you want (building up tolerance). ?Having symptoms of withdrawal when you stop drinking. Withdrawal symptoms may include: ?Trouble sleeping, leading to tiredness (fatigue). ?Mood swings of depression and anxiety. ?Physical symptoms, such as a fast heart rate, rapid breathing, high blood pressure (hypertension), fever, cold sweats, or nausea. ?Seizures. ?Severe confusion. ?Feeling or  seeing things that are not there (hallucinations). ?Shaking movements that you cannot control (tremors). ?How is this diagnosed? ?This condition is diagnosed with an assessment. Your health care provider may start by asking three or four questions about your drinking, or he or she may give you a simple test to take. This helps to get clear information from you. ?You may also have a physical exam or lab tests. You may be referred to a substance abuse counselor. ?How is this treated? ?With education, some people with alcohol use disorder are able to reduce their drinking. Many with this disorder cannot change their drinking behavior on their own and need help from substance use specialists. These specialists are counselors who can help diagnose how severe your disorder is and what type of treatment you need. Treatments may include: ?Detoxification. Detoxification involves quitting drinking with supervision and direction of health care providers. Your health care provider may prescribe prescription medicines within the first week to help lessen withdrawal symptoms. Alcohol withdrawal can be dangerous and life-threatening. Detoxification may be provided in a home, community, or primary care setting, or in a hospital or substance use treatment facility. ?Counseling. This may involve motivational interviewing (MI), family therapy, or cognitive behavioral therapy (CBT). It is provided by substance use treatment counselors or professional therapists. A counselor can address the things you can do to change your drinking behavior and how to maintain the changes. Talk therapy aims to: ?Identify your positive motivations to change. ?Identify and avoid the things that trigger your drinking. ?Help you learn how to plan your behavior change. ?Develop support systems that can help you sustain the change. ?Medicines. Medicines can help treat this disorder by: ?Decreasing cravings. ?Decreasing the  positive feeling you have when you  drink. ?Causing an uncomfortable physical reaction when you drink (aversion therapy). ?Mutual help groups such as Alcoholics Anonymous (AA). These groups are led by people who have quit drinking. The groups provide emotional support, advice, and guidance. ?Some people with this condition benefit from a combination of treatments provided by specialized substance use treatment centers. ?Follow these instructions at home: ?Medicines ?Take over-the-counter and prescription medicines only as told by your health care provider. ?Ask before starting any new medicines, herbs, or supplements. ?General instructions ?Ask friends and family members to support your choice to stay sober. ?Avoid situations where alcohol is served. ?Create a plan to deal with tempting situations. ?Attend support groups regularly. ?Practice hobbies or activities you enjoy. ?Do not drink and drive. ?Keep all follow-up visits as told by your health care provider. This is important. ?How is this prevented? ?If you drink alcohol: ?Limit how much you use to: ?0-1 drink a day for nonpregnant women. ?0-2 drinks a day for men. ?Be aware of how much alcohol is in your drink. In the [place] one drink equals one 12 oz bottle of beer (355 mL), one 5 oz glass of wine (148 mL), or one 1? oz glass of hard liquor (44 mL). ?If you have a mental health condition, seek treatment. Develop a healthy lifestyle through: ?Meditation or deep breathing. ?Exercise. ?Spending time in nature. ?Listening to music. ?Talking with a trusted friend or family member. ?If you are an adolescent: ?Do not drink alcohol. Avoid gatherings where you might be tempted. ?Do not be afraid to say no if someone offers you alcohol. Speak up about why you do not want to drink. Set a positive example for others around you by not drinking. ?Build relationships with friends who do not drink. ?Where to find more information ?Substance Abuse and Mental Health Services Administration: SamedayNews.com.cy ?Alcoholics  Anonymous: ShedSizes.ch ?Contact a health care provider if: ?You cannot take your medicines as told. ?Your symptoms get worse or you experience symptoms of withdrawal when you stop drinking. ?You start drinking again (relapse) and your symptoms get worse. ?Get help right away if: ?You have thoughts about hurting yourself or others. ?If you ever feel like you may hurt yourself or others, or have thoughts about taking your own life, get help right away. Go to your nearest emergency department or: ?Call your local emergency services (911 in the [place] ?Call a suicide crisis helpline, such as the [facility] at 812 052 6896 or 988 in the [place]. This is open 24 hours a day in the [place] ?Text the Crisis Text Line at (612)205-2018 (in the [place].). ?Summary ?Alcohol use disorder is a condition in which drinking disrupts daily life. People with this condition drink too much alcohol and cannot control their drinking. ?Treatment may include detoxification, counseling, medicines, and support groups. ?Ask friends and family members to support you. Avoid situations where alcohol is served. ?Get help right away if you have thoughts about hurting yourself or others. ?This information is not intended to replace advice given to you by your health care provider. Make sure you discuss any questions you have with your health care provider. ?Document Revised: 04/05/2021 Document Reviewed: 07/30/2019 ?Elsevier Patient Education ? [place]. ? ?

## 2021-12-25 NOTE — Progress Notes (Signed)
?[place] ? ?Roy Martinez, is a 66 y.o. male ? ?[id number] ? ?[id number] ? ?DOB - 10/12/1955 ? ?Chief Complaint  ?Patient presents with  ? feet feel cold   ?  Denies numbness/tingling   ?    ? ?Subjective:  ? ?Roy Martinez is a 66 y.o. male here today for a follow up visit of HTN. He is complaining of fatigue and feeling exhausted all the time. Questioned have you started drinking again " I can't lie to you yes'.  2 quarts of beer a day.  Patient has No headache, No chest pain, No abdominal pain - No Nausea, No new weakness tingling or numbness, No Cough - shortness of breath ? ?No problems updated. ? ?Allergies  ?Allergen Reactions  ? Penicillins Hives  ?  Childhood allergy ?Has patient had a PCN reaction causing immediate rash, facial/tongue/throat swelling, SOB or lightheadedness with hypotension: No ?Has patient had a PCN reaction causing severe rash involving mucus membranes or skin necrosis: No ?Has patient had a PCN reaction that required hospitalization No ?Has patient had a PCN reaction occurring within the last 10 years: No ?If all of the above answers are "NO", then may proceed with Cephalosporin use. ?  ? ? ?Past Medical History:  ?Diagnosis Date  ? Chronic hepatitis C virus infection ([place])   ? under treatment June 2016  ? H/O ETOH abuse   ? Hypertension   ? Smoker   ? 1/2 ppd  ? ? ?No current outpatient medications on file prior to visit.  ? ?No current facility-administered medications on file prior to visit.  ? ? ?Objective:  ? ?Vitals:  ? 12/25/21 1418  ?BP: 123/65  ?Pulse: (!) 59  ?Temp: 97.7 ?F (36.5 ?C)  ?TempSrc: Oral  ?SpO2: 98%  ?Weight: 165 lb 12.8 oz (75.2 kg)  ?Height: _0  (1.854 m)  ? ? ?Exam ?General appearance : Awake, alert, not in any distress. Speech Clear. Not toxic looking ?HEENT: Atraumatic and Normocephalic, pupils equally reactive to light and accomodation (right ear cerumen impaction) ?Neck: Supple, no JVD. No cervical lymphadenopathy.  ?Chest: Good  air entry bilaterally, no added sounds  ?CVS: S1 S2 regular, no murmurs.  ?Abdomen: Bowel sounds present, Non tender and not distended with no gaurding, rigidity or rebound. ?Extremities: B/L Lower Ext shows no edema, both legs are warm to touch ?Neurology: Awake alert, and oriented X 3, CN II-XII intact, Non focal ?Skin: No Rash ? ?Data Review ?Lab Results  ?Component Value Date  ? HGBA1C 5.2 12/25/2021  ? ? ?Assessment & Plan  ?Melanie was seen today for feet feel cold . ? ?Diagnoses and all orders for this visit: ? ?Screening for diabetes mellitus ?-     HgB A1c 5.2  ? ?Fatigue, unspecified type ?-     CBC with Differential ?-     TSH + free T4 ?-     Vitamin D, 25-hydroxy ?-     Vitamin B12/B1 ? ?ETOH abuse ?-     CMP14+EGFR ? ?Essential hypertension ?Bp lower then usual decreased HCTZ from HCTZ 12m to HCTZ 12.5 mg daily.  ?Continue to monitor  low-sodium, DASH diet, medication compliance, 150 minutes of moderate intensity exercise per week. ?Discussed medication compliance, adverse effects.  ? ?Other orders ?-     hydrochlorothiazide (HYDRODIURIL) 12.5 MG tablet; Take 2 tablets (25 mg total) by mouth daily. ? ?  ?Patient have been counseled extensively about nutrition and exercise. Other issues discussed during this visit include: low cholesterol  diet, weight control and daily exercise, foot care, annual eye examinations at Ophthalmology, importance of adherence with medications and regular follow-up. We also discussed long term complications of uncontrolled diabetes and hypertension.  ? ?No follow-ups on file. ? ?The patient was given clear instructions to go to ER or return to medical center if symptoms don't improve, worsen or new problems develop. The patient verbalized understanding. The patient was told to call to get lab results if they haven't heard anything in the next week.  ? ?This note has been created with Surveyor, quantity. Any transcriptional errors are  unintentional.  ? ?Kerin Perna, NP ?12/25/2021, 2:38 PM  ?

## 2021-12-26 ENCOUNTER — Other Ambulatory Visit (INDEPENDENT_AMBULATORY_CARE_PROVIDER_SITE_OTHER): Payer: Self-pay | Admitting: Primary Care

## 2021-12-26 LAB — CMP14+EGFR
ALT: 25 IU/L (ref 0–44)
AST: 28 IU/L (ref 0–40)
Albumin/Globulin Ratio: 1.5 (ref 1.2–2.2)
Albumin: 4.4 g/dL (ref 3.8–4.8)
Alkaline Phosphatase: 79 IU/L (ref 44–121)
BUN/Creatinine Ratio: 21 (ref 10–24)
BUN: 16 mg/dL (ref 8–27)
Bilirubin Total: 0.4 mg/dL (ref 0.0–1.2)
CO2: 22 mmol/L (ref 20–29)
Calcium: 9.4 mg/dL (ref 8.6–10.2)
Chloride: 100 mmol/L (ref 96–106)
Creatinine, Ser: 0.78 mg/dL (ref 0.76–1.27)
Globulin, Total: 2.9 g/dL (ref 1.5–4.5)
Glucose: 98 mg/dL (ref 70–99)
Potassium: 3.8 mmol/L (ref 3.5–5.2)
Sodium: 138 mmol/L (ref 134–144)
Total Protein: 7.3 g/dL (ref 6.0–8.5)
eGFR: 99 mL/min/{1.73_m2} (ref >59)

## 2021-12-26 LAB — CBC WITH DIFFERENTIAL/PLATELET
Basophils Absolute: 0 10*3/uL (ref 0.0–0.2)
Basos: 1 %
EOS (ABSOLUTE): 0.2 10*3/uL (ref 0.0–0.4)
Eos: 3 %
Hematocrit: 41.2 % (ref 37.5–51.0)
Hemoglobin: 13.3 g/dL (ref 13.0–17.7)
Immature Grans (Abs): 0 10*3/uL (ref 0.0–0.1)
Immature Granulocytes: 0 %
Lymphocytes Absolute: 2 10*3/uL (ref 0.7–3.1)
Lymphs: 38 %
MCH: 29.8 pg (ref 26.6–33.0)
MCHC: 32.3 g/dL (ref 31.5–35.7)
MCV: 92 fL (ref 79–97)
Monocytes Absolute: 0.6 10*3/uL (ref 0.1–0.9)
Monocytes: 11 %
Neutrophils Absolute: 2.5 10*3/uL (ref 1.4–7.0)
Neutrophils: 47 %
Platelets: 325 10*3/uL (ref 150–450)
RBC: 4.46 x10E6/uL (ref 4.14–5.80)
RDW: 12.6 % (ref 11.6–15.4)
WBC: 5.3 10*3/uL (ref 3.4–10.8)

## 2021-12-26 LAB — TSH+FREE T4
Free T4: 1.02 ng/dL (ref 0.82–1.77)
TSH: 0.595 u[IU]/mL (ref 0.450–4.500)

## 2021-12-26 LAB — VITAMIN B12: Vitamin B-12: 483 pg/mL (ref 232–1245)

## 2021-12-26 LAB — VITAMIN D 25 HYDROXY (VIT D DEFICIENCY, FRACTURES): Vit D, 25-Hydroxy: 10.3 ng/mL — ABNORMAL LOW (ref 30.0–100.0)

## 2021-12-26 LAB — VITAMIN B1

## 2021-12-26 MED ORDER — VITAMIN D (ERGOCALCIFEROL) 1.25 MG (50000 UNIT) PO CAPS: 50000.0000 [IU] | ORAL_CAPSULE | ORAL | 0 refills | Status: DC

## 2021-12-27 ENCOUNTER — Telehealth (INDEPENDENT_AMBULATORY_CARE_PROVIDER_SITE_OTHER): Payer: Self-pay

## 2021-12-27 NOTE — Telephone Encounter (Signed)
Per DPR left results and medication on patient voicemail. Asked patient to return call to office with any questions or concerns. Nat Christen, CMA  ?

## 2021-12-27 NOTE — Telephone Encounter (Signed)
-----   Message from Kerin Perna, NP sent at 12/26/2021  2:11 PM EDT ----- ?Labs are  normal. Except vitamin D- Vitamin D is needed to make and keep bones strong. The patient will need to take a prescription strength vitamin D tablet once weekly until next appointment.  Vitamin D level will be rechecked at future visit.  I have sent the vitamin D tablet to the pharmacy and it should be ready for pick up.   ? Try to drink at least 48 oz of water per day. Work on eating a low fat, heart healthy diet and participate in regular aerobic exercise program to control as well. Exercise at least  30 minutes per day-5 days per week. Monitor eating red meat, fried foods,  junk foods, sodas, sugary foods or drinks, unhealthy snacking, alcohol or smoking.    ?  ?

## 2022-01-05 ENCOUNTER — Ambulatory Visit (INDEPENDENT_AMBULATORY_CARE_PROVIDER_SITE_OTHER): Payer: Medicare HMO | Admitting: Primary Care

## 2022-01-05 ENCOUNTER — Ambulatory Visit (INDEPENDENT_AMBULATORY_CARE_PROVIDER_SITE_OTHER): Payer: Medicare HMO

## 2022-01-09 ENCOUNTER — Other Ambulatory Visit (INDEPENDENT_AMBULATORY_CARE_PROVIDER_SITE_OTHER): Payer: Self-pay | Admitting: Primary Care

## 2022-01-09 DIAGNOSIS — I10 Essential (primary) hypertension: Secondary | ICD-10-CM

## 2022-01-09 NOTE — Telephone Encounter (Signed)
Refused HCTZ 25 mg because it's the wrong dose.   Dose has been changed to 12.5 mg ?

## 2022-02-25 ENCOUNTER — Ambulatory Visit (HOSPITAL_COMMUNITY)
Admission: EM | Admit: 2022-02-25 | Discharge: 2022-02-25 | Disposition: A | Discharge status: Home / Self Care | Payer: Medicare HMO | Attending: Family Medicine | Admitting: Family Medicine

## 2022-02-25 ENCOUNTER — Encounter (HOSPITAL_COMMUNITY): Payer: Self-pay | Admitting: Emergency Medicine

## 2022-02-25 DIAGNOSIS — R42 Dizziness and giddiness: Secondary | ICD-10-CM | POA: Diagnosis not present

## 2022-02-25 LAB — CBG MONITORING, ED: Glucose-Capillary: 110 mg/dL — ABNORMAL HIGH (ref 70–99)

## 2022-02-25 NOTE — Discharge Instructions (Addendum)
Your sugar was 110 today.  That is just a little over normal, and is not a cause for concern at this point.  Staff is drawn a blood count and other chemistries on you.  Staff will notify you if there is anything seriously abnormal on those lab results.  Please call your primary care office tomorrow

## 2022-02-25 NOTE — ED Provider Notes (Signed)
In [facility]    CSN: [id number] Arrival date & time: 02/25/22  1325      History   Chief Complaint Chief Complaint  Patient presents with   Dizziness    HPI Roy Martinez is a 66 y.o. male.    Dizziness Here for feeling lightheaded and tired today.  He may be began yesterday.  He went to work for couple hours but then had to leave because he felt so tired.  He states he had COVID testing done at work and it was negative.  No vertigo described  He has no cough or URI symptoms, no nausea or vomiting, no diarrhea, no abdominal pain, no chest pain, no shortness of breath, no dysuria or hematuria.  Past Medical History:  Diagnosis Date   Chronic hepatitis C virus infection ([place])    under treatment June 2016   H/O ETOH abuse    Hypertension    Smoker    1/2 ppd    Patient Active Problem List   Diagnosis Date Noted   Smoker 03/28/2017   Irregular heart beat 03/28/2017   Abnormal EKG 03/28/2017   ETOH abuse 03/28/2017   Chronic hepatitis C without hepatic coma ([place]) 03/11/2017   Patellar tendon rupture, right, initial encounter 06/26/2016    Past Surgical History:  Procedure Laterality Date   KNEE ARTHROSCOPY     KNEE ARTHROSCOPY WITH MENISCAL REPAIR Right 06/26/2016   Procedure: KNEE ARTHROSCOPY WITH MENISCAL REPAIR VS RESECTION;  Surgeon: Meredith Pel, MD;  Location: [place];  Service: Orthopedics;  Laterality: Right;   PATELLAR TENDON REPAIR Right 06/26/2016   Procedure: PATELLA TENDON REPAIR;  Surgeon: Meredith Pel, MD;  Location: [place];  Service: Orthopedics;  Laterality: Right;       Home Medications    Prior to Admission medications   Medication Sig Start Date End Date Taking? Authorizing Provider  hydrochlorothiazide (HYDRODIURIL) 12.5 MG tablet Take 2 tablets (25 mg total) by mouth daily. 12/25/21  Yes Kerin Perna, NP  Vitamin D, Ergocalciferol, (DRISDOL) 1.25 MG (50000 UNIT) CAPS capsule Take 1 capsule (50,000 Units total) by  mouth every 7 (seven) days. 12/26/21  Yes Kerin Perna, NP    Family History Family History  Problem Relation Age of Onset   Heart failure Mother    Heart disease Mother    Heart failure Father    Arrhythmia Brother    Colon cancer Neg Hx    Esophageal cancer Neg Hx    Rectal cancer Neg Hx    Stomach cancer Neg Hx     Social History Social History   Tobacco Use   Smoking status: Every Day    Packs/day: 0.50    Types: Cigarettes   Smokeless tobacco: Never   Tobacco comments:    cutting back  Vaping Use   Vaping Use: Never used  Substance Use Topics   Alcohol use: Yes    Alcohol/week: 5.0 - 6.0 standard drinks    Types: 5 - 6 Cans of beer per week    Comment: pt states daily beer drinker   Drug use: Yes    Types: "Crack" cocaine    Comment: Quit 4 years ago     Allergies   Penicillins   Review of Systems Review of Systems  Neurological:  Positive for dizziness.    Physical Exam Triage Vital Signs ED Triage Vitals  Enc Vitals Group     BP 02/25/22 1353 122/67     Pulse Rate  02/25/22 1353 (!) 53     Resp 02/25/22 1353 18     Temp 02/25/22 1353 98.3 F (36.8 C)     Temp Source 02/25/22 1353 Oral     SpO2 02/25/22 1353 94 %     Weight --      Height 02/25/22 1354 '6\' 1"'$  (1.854 m)     Head Circumference --      Peak Flow --      Pain Score 02/25/22 1354 0     Pain Loc --      Pain Edu? --      Excl. in [place]? --    No data found.  Updated Vital Signs BP 122/67 (BP Location: Left Arm)   Pulse (!) 53   Temp 98.3 F (36.8 C) (Oral)   Resp 18   Ht '6\' 1"'$  (1.854 m)   SpO2 94%   BMI 21.87 kg/m   Visual Acuity Right Eye Distance:   Left Eye Distance:   Bilateral Distance:    Right Eye Near:   Left Eye Near:    Bilateral Near:     Physical Exam Vitals reviewed.  Constitutional:      General: He is not in acute distress.    Appearance: He is not toxic-appearing.  HENT:     Left Ear: Ear canal normal.     Nose: Nose normal.      Mouth/Throat:     Mouth: Mucous membranes are moist.     Pharynx: No oropharyngeal exudate or posterior oropharyngeal erythema.  Eyes:     Extraocular Movements: Extraocular movements intact.     Conjunctiva/sclera: Conjunctivae normal.     Pupils: Pupils are equal, round, and reactive to light.  Cardiovascular:     Rate and Rhythm: Normal rate and regular rhythm.     Heart sounds: No murmur heard. Pulmonary:     Effort: Pulmonary effort is normal.     Breath sounds: Normal breath sounds.  Abdominal:     Palpations: Abdomen is soft.     Tenderness: There is no abdominal tenderness. There is no guarding.  Musculoskeletal:     Cervical back: Neck supple.  Lymphadenopathy:     Cervical: No cervical adenopathy.  Skin:    Capillary Refill: Capillary refill takes less than 2 seconds.     Coloration: Skin is not jaundiced or pale.  Neurological:     General: No focal deficit present.     Mental Status: He is alert and oriented to person, place, and time.  Psychiatric:        Behavior: Behavior normal.     UC Treatments / Results  Labs (all labs ordered are listed, but only abnormal results are displayed) Labs Reviewed  CBG MONITORING, ED - Abnormal; Notable for the following components:      Result Value   Glucose-Capillary 110 (*)    All other components within normal limits    EKG   Radiology No results found.  Procedures Procedures (including critical care time)  Medications Ordered in UC Medications - No data to display  Initial Impression / Assessment and Plan / UC Course  I have reviewed the triage vital signs and the nursing notes.  Pertinent labs & imaging results that were available during my care of the patient were reviewed by me and considered in my medical decision making (see chart for details).     Fingerstick glucose is 110.  CBC and chemistry panel will be done today to ensure that  there is nothing else alarming to be found.  I have asked him to  contact his primary care tomorrow about how he is feeling.  Work note is supplied  Signs are overall reassuring with a normal blood pressure.  His heart rate is 53 and this is within range of where he usually is when vital signs have been done previously in epic.  O2 sat is 94%.  His exam shows him not to be in any distress Final Clinical Impressions(s) / UC Diagnoses   Final diagnoses:  Dizziness     Discharge Instructions      Your sugar was 110 today.  That is just a little over normal, and is not a cause for concern at this point.  Staff is drawn a blood count and other chemistries on you.  Staff will notify you if there is anything seriously abnormal on those lab results.  Please call your primary care office tomorrow     ED Prescriptions   None    PDMP not reviewed this encounter.   Barrett Henle, MD 02/25/22 1504

## 2022-02-25 NOTE — ED Notes (Signed)
Notified Dr Windy Carina of patient's vital signs.

## 2022-02-25 NOTE — ED Triage Notes (Signed)
Patient c/o dizziness and fatigue x 2 days.  Denies any chest pain or SOB.

## 2022-02-26 ENCOUNTER — Other Ambulatory Visit (INDEPENDENT_AMBULATORY_CARE_PROVIDER_SITE_OTHER): Payer: Self-pay | Admitting: Primary Care

## 2022-02-26 NOTE — Telephone Encounter (Signed)
Medication Refill - Medication: hydrochlorothiazide (HYDRODIURIL) 12.5 MG tablet [[id number]]   Has the patient contacted their pharmacy? Yes.    Preferred Pharmacy (with phone number or street name):  [id number] - [place], [address]  [place] [address]  Phone: [phone] Fax: [phone]  Hours: Open 24 hours     Has the patient been seen for an appointment in the last year OR does the patient have an upcoming appointment? Yes.    Agent: Please be advised that RX refills may take up to 3 business days. We ask that you follow-up with your pharmacy.

## 2022-02-27 MED ORDER — HYDROCHLOROTHIAZIDE 12.5 MG PO TABS: 25.0000 mg | ORAL_TABLET | Freq: Every day | ORAL | 0 refills | Status: DC

## 2022-02-27 NOTE — Telephone Encounter (Signed)
Requested Prescriptions  Pending Prescriptions Disp Refills  . hydrochlorothiazide (HYDRODIURIL) 12.5 MG tablet 60 tablet 0    Sig: Take 2 tablets (25 mg total) by mouth daily.     Cardiovascular: Diuretics - Thiazide Passed - 02/26/2022 11:57 AM      Passed - Cr in normal range and within 180 days    Creat  Date Value Ref Range Status  03/11/2017 0.72 0.70 - 1.25 mg/dL Final    Comment:      For patients > or = 66 years of age: The upper reference limit for Creatinine is approximately 13% higher for people identified as African-American.      Creatinine, Ser  Date Value Ref Range Status  12/25/2021 0.78 0.76 - 1.27 mg/dL Final         Passed - K in normal range and within 180 days    Potassium  Date Value Ref Range Status  12/25/2021 3.8 3.5 - 5.2 mmol/L Final         Passed - Na in normal range and within 180 days    Sodium  Date Value Ref Range Status  12/25/2021 138 134 - 144 mmol/L Final         Passed - Last BP in normal range    BP Readings from Last 1 Encounters:  02/25/22 122/67         Passed - Valid encounter within last 6 months    Recent Outpatient Visits          2 months ago Screening for diabetes mellitus   [place] Juluis Mire P, NP   5 months ago Need for prophylactic vaccination against Streptococcus pneumoniae (pneumococcus)   [place] [facility] Kerin Perna, NP   8 months ago Need for immunization against influenza   [place], Michelle P, NP   1 year ago Hearing loss secondary to cerumen impaction, bilateral   [facility] Kerin Perna, NP   1 year ago Need for immunization against influenza   [place], [place], NP      Future Appointments            In 4 weeks Oletta Lamas Milford Cage, NP [place]

## 2022-03-26 ENCOUNTER — Ambulatory Visit (INDEPENDENT_AMBULATORY_CARE_PROVIDER_SITE_OTHER): Payer: Medicare HMO | Admitting: Primary Care

## 2022-03-28 ENCOUNTER — Ambulatory Visit (INDEPENDENT_AMBULATORY_CARE_PROVIDER_SITE_OTHER): Payer: Medicare HMO | Admitting: Primary Care

## 2022-04-02 ENCOUNTER — Ambulatory Visit (INDEPENDENT_AMBULATORY_CARE_PROVIDER_SITE_OTHER): Payer: Medicare HMO | Admitting: Primary Care

## 2022-04-02 ENCOUNTER — Encounter (INDEPENDENT_AMBULATORY_CARE_PROVIDER_SITE_OTHER): Payer: Self-pay | Admitting: Primary Care

## 2022-04-02 VITALS — BP 114/82 | HR 69 | Temp 97.9°F | Ht 73.0 in | Wt 168.0 lb

## 2022-04-02 DIAGNOSIS — E559 Vitamin D deficiency, unspecified: Secondary | ICD-10-CM | POA: Diagnosis not present

## 2022-04-02 DIAGNOSIS — I10 Essential (primary) hypertension: Secondary | ICD-10-CM

## 2022-04-02 DIAGNOSIS — Z760 Encounter for issue of repeat prescription: Secondary | ICD-10-CM

## 2022-04-02 MED ORDER — VITAMIN D3 50 MCG (2000 UT) PO CAPS: 2000.0000 [IU] | ORAL_CAPSULE | Freq: Every day | ORAL | 1 refills | Status: DC

## 2022-04-02 MED ORDER — HYDROCHLOROTHIAZIDE 25 MG PO TABS: 25.0000 mg | ORAL_TABLET | Freq: Every day | ORAL | 1 refills | Status: DC

## 2022-04-02 NOTE — Progress Notes (Signed)
[place]   Roy Martinez is a 66 y.o. male presents for hypertension evaluation, Denies shortness of breath, headaches, chest pain or lower extremity edema, sudden onset, vision changes, unilateral weakness, dizziness, paresthesias   Patient reports adherence with medications.  Dietary habits include: monitoring sodium Exercise habits include:walking Family / Social history: Brother 1/2-CVA, Father MI   Past Medical History:  Diagnosis Date   Chronic hepatitis C virus infection ([place])    under treatment June 2016   H/O ETOH abuse    Hypertension    Smoker    1/2 ppd   Past Surgical History:  Procedure Laterality Date   KNEE ARTHROSCOPY     KNEE ARTHROSCOPY WITH MENISCAL REPAIR Right 06/26/2016   Procedure: KNEE ARTHROSCOPY WITH MENISCAL REPAIR VS RESECTION;  Surgeon: Meredith Pel, MD;  Location: [facility];  Service: Orthopedics;  Laterality: Right;   PATELLAR TENDON REPAIR Right 06/26/2016   Procedure: PATELLA TENDON REPAIR;  Surgeon: Meredith Pel, MD;  Location: [facility];  Service: Orthopedics;  Laterality: Right;   Allergies  Allergen Reactions   Penicillins Hives    Childhood allergy Has patient had a PCN reaction causing immediate rash, facial/tongue/throat swelling, SOB or lightheadedness with hypotension: No Has patient had a PCN reaction causing severe rash involving mucus membranes or skin necrosis: No Has patient had a PCN reaction that required hospitalization No Has patient had a PCN reaction occurring within the last 10 years: No If all of the above answers are "NO", then may proceed with Cephalosporin use.    Current Outpatient Medications on File Prior to Visit  Medication Sig Dispense Refill   hydrochlorothiazide (HYDRODIURIL) 12.5 MG tablet Take 2 tablets (25 mg total) by mouth daily. 60 tablet 0   No current facility-administered medications on file prior to visit.   Social History   Socioeconomic History   Marital status: Single     Spouse name: Not on file   Number of children: Not on file   Years of education: Not on file   Highest education level: Not on file  Occupational History   Not on file  Tobacco Use   Smoking status: Every Day    Packs/day: 0.50    Types: Cigarettes   Smokeless tobacco: Never   Tobacco comments:    cutting back  Vaping Use   Vaping Use: Never used  Substance and Sexual Activity   Alcohol use: Yes    Alcohol/week: 5.0 - 6.0 standard drinks of alcohol    Types: 5 - 6 Cans of beer per week    Comment: pt states daily beer drinker   Drug use: Yes    Types: "Crack" cocaine    Comment: Quit 4 years ago   Sexual activity: Never  Other Topics Concern   Not on file  Social History Narrative   ** Merged History Encounter **       Social Determinants of Health   Financial Resource Strain: Not on file  Food Insecurity: Not on file  Transportation Needs: Not on file  Physical Activity: Not on file  Stress: Not on file  Social Connections: Not on file  Intimate Partner Violence: Not on file   Family History  Problem Relation Age of Onset   Heart failure Mother    Heart disease Mother    Heart failure Father    Arrhythmia Brother    Colon cancer Neg Hx    Esophageal cancer Neg Hx    Rectal cancer Neg Hx  Stomach cancer Neg Hx      OBJECTIVE:  Vitals:   04/02/22 1445  BP: 114/82  Pulse: 69  Temp: 97.9 F (36.6 C)  TempSrc: Oral  SpO2: 96%  Weight: 168 lb (76.2 kg)  Height: '6\' 1"'$  (1.854 m)    Physical Exam Vitals reviewed.  Constitutional:      Appearance: Normal appearance.  HENT:     Head: Normocephalic.     Right Ear: Tympanic membrane and external ear normal.     Left Ear: Tympanic membrane and external ear normal.     Nose: Nose normal.  Eyes:     Extraocular Movements: Extraocular movements intact.  Cardiovascular:     Rate and Rhythm: Normal rate and regular rhythm.  Pulmonary:     Effort: Pulmonary effort is normal.     Breath sounds:  Normal breath sounds.  Abdominal:     General: Bowel sounds are normal.     Palpations: Abdomen is soft.  Musculoskeletal:        General: Normal range of motion.     Cervical back: Normal range of motion.  Skin:    General: Skin is warm and dry.  Neurological:     Mental Status: He is alert and oriented to person, place, and time.  Psychiatric:        Mood and Affect: Mood normal.        Behavior: Behavior normal.        Thought Content: Thought content normal.        Judgment: Judgment normal.    ROS Comprehensive ROS Pertinent positive and negative noted in HPI   Last 3 Office BP readings: BP Readings from Last 3 Encounters:  04/02/22 114/82  02/25/22 122/67  12/25/21 123/65    BMET    Component Value Date/Time   NA 138 12/25/2021 1449   K 3.8 12/25/2021 1449   CL 100 12/25/2021 1449   CO2 22 12/25/2021 1449   GLUCOSE 98 12/25/2021 1449   GLUCOSE 113 (H) 12/28/2017 1426   BUN 16 12/25/2021 1449   CREATININE 0.78 12/25/2021 1449   CREATININE 0.72 03/11/2017 0953   CALCIUM 9.4 12/25/2021 1449   GFRNONAA 98 05/31/2020 1128   GFRNONAA >89 03/11/2017 0953   GFRAA 113 05/31/2020 1128   GFRAA >89 03/11/2017 0953    Renal function: CrCl cannot be calculated (Patient's most recent lab result is older than the maximum 21 days allowed.).  Clinical ASCVD: Yes  The 10-year ASCVD risk score (Arnett DK, et al., 2019) is: 18.8%   Values used to calculate the score:     Age: 54 years     Sex: Male     Is Non-Hispanic African American: Yes     Diabetic: No     Tobacco smoker: Yes     Systolic Blood Pressure: 932 mmHg     Is BP treated: Yes     HDL Cholesterol: 78 mg/dL     Total Cholesterol: 189 mg/dL  ASCVD risk factors include- [name]   ASSESSMENT & PLAN: Roy Martinez was seen today for hypertension and medication refill.  Diagnoses and all orders for this visit:  Essential hypertension Well controlled on HCTZ '25mg'$  daily. BP goal - <140/90 Explained that having  normal blood pressure is the goal and medications are helping to get to goal and maintain normal blood pressure. DIET: Limit salt intake, read nutrition labels to check salt content, limit fried and high fatty foods  Avoid using multisymptom OTC cold preparations that  generally contain sudafed which can rise BP. Consult with pharmacist on best cold relief products to use for persons with HTN EXERCISE Discussed incorporating exercise such as walking - 30 minutes most days of the week and can do in 10 minute intervals    Goal BP:  For patients younger than 60: Goal BP < 130/80. For patients 60 and older: Goal BP < 140/90. For patients with diabetes: Goal BP < 130/80. Your most recent BP: 114/82 Minimize salt intake. Minimize alcohol intake  Other orders/Medication refill -     hydrochlorothiazide (HYDRODIURIL) 25 MG tablet; Take 1 tablet (25 mg total) by mouth daily. -     Cholecalciferol (VITAMIN D3) 50 MCG (2000 UT) capsule; Take 1 capsule (2,000 Units total) by mouth daily.  Vitamin D deficiency Vitamin D is needed to make and keep bones strong. The patient will need to take a prescription strength vitamin D tablet once weekly until next appointment.  Vitamin D level will be rechecked at future visit.  I have sent the vitamin D tablet to the pharmacy and it should be ready for pick up. -     Cholecalciferol Take 1 capsule (2,000 Units total) by mouth daily.  Other orders -     hydrochlorothiazide (HYDRODIURIL) 25 MG tablet; Take 1 tablet (25 mg total) by mouth daily.   This note has been created with Surveyor, quantity. Any transcriptional errors are unintentional.   Kerin Perna, NP 04/02/2022, 2:49 PM

## 2022-04-20 ENCOUNTER — Ambulatory Visit (INDEPENDENT_AMBULATORY_CARE_PROVIDER_SITE_OTHER): Payer: Medicare HMO

## 2022-04-20 ENCOUNTER — Telehealth (INDEPENDENT_AMBULATORY_CARE_PROVIDER_SITE_OTHER): Payer: Self-pay

## 2022-04-20 NOTE — Telephone Encounter (Signed)
Called patient to complete AWV. He stated he would like to reschedule telephone visit.

## 2022-04-20 NOTE — Patient Instructions (Signed)
Health Maintenance, Male Adopting a healthy lifestyle and getting preventive care are important in promoting health and wellness. Ask your health care provider about: The right schedule for you to have regular tests and exams. Things you can do on your own to prevent diseases and keep yourself healthy. What should I know about diet, weight, and exercise? Eat a healthy diet  Eat a diet that includes plenty of vegetables, fruits, low-fat dairy products, and lean protein. Do not eat a lot of foods that are high in solid fats, added sugars, or sodium. Maintain a healthy weight Body mass index (BMI) is a measurement that can be used to identify possible weight problems. It estimates body fat based on height and weight. Your health care provider can help determine your BMI and help you achieve or maintain a healthy weight. Get regular exercise Get regular exercise. This is one of the most important things you can do for your health. Most adults should: Exercise for at least 150 minutes each week. The exercise should increase your heart rate and make you sweat (moderate-intensity exercise). Do strengthening exercises at least twice a week. This is in addition to the moderate-intensity exercise. Spend less time sitting. Even light physical activity can be beneficial. Watch cholesterol and blood lipids Have your blood tested for lipids and cholesterol at 66 years of age, then have this test every 5 years. You may need to have your cholesterol levels checked more often if: Your lipid or cholesterol levels are high. You are older than 66 years of age. You are at high risk for heart disease. What should I know about cancer screening? Many types of cancers can be detected early and may often be prevented. Depending on your health history and family history, you may need to have cancer screening at various ages. This may include screening for: Colorectal cancer. Prostate cancer. Skin cancer. Lung  cancer. What should I know about heart disease, diabetes, and high blood pressure? Blood pressure and heart disease High blood pressure causes heart disease and increases the risk of stroke. This is more likely to develop in people who have high blood pressure readings or are overweight. Talk with your health care provider about your target blood pressure readings. Have your blood pressure checked: Every 3-5 years if you are 18-39 years of age. Every year if you are 40 years old or older. If you are between the ages of 65 and 75 and are a current or former smoker, ask your health care provider if you should have a one-time screening for abdominal aortic aneurysm (AAA). Diabetes Have regular diabetes screenings. This checks your fasting blood sugar level. Have the screening done: Once every three years after age 45 if you are at a normal weight and have a low risk for diabetes. More often and at a younger age if you are overweight or have a high risk for diabetes. What should I know about preventing infection? Hepatitis B If you have a higher risk for hepatitis B, you should be screened for this virus. Talk with your health care provider to find out if you are at risk for hepatitis B infection. Hepatitis C Blood testing is recommended for: Everyone born from 1945 through 1965. Anyone with known risk factors for hepatitis C. Sexually transmitted infections (STIs) You should be screened each year for STIs, including gonorrhea and chlamydia, if: You are sexually active and are younger than 66 years of age. You are older than 66 years of age and your   health care provider tells you that you are at risk for this type of infection. Your sexual activity has changed since you were last screened, and you are at increased risk for chlamydia or gonorrhea. Ask your health care provider if you are at risk. Ask your health care provider about whether you are at high risk for HIV. Your health care provider  may recommend a prescription medicine to help prevent HIV infection. If you choose to take medicine to prevent HIV, you should first get tested for HIV. You should then be tested every 3 months for as long as you are taking the medicine. Follow these instructions at home: Alcohol use Do not drink alcohol if your health care provider tells you not to drink. If you drink alcohol: Limit how much you have to 0-2 drinks a day. Know how much alcohol is in your drink. In the [place] one drink equals one 12 oz bottle of beer (355 mL), one 5 oz glass of wine (148 mL), or one 1 oz glass of hard liquor (44 mL). Lifestyle Do not use any products that contain nicotine or tobacco. These products include cigarettes, chewing tobacco, and vaping devices, such as e-cigarettes. If you need help quitting, ask your health care provider. Do not use street drugs. Do not share needles. Ask your health care provider for help if you need support or information about quitting drugs. General instructions Schedule regular health, dental, and eye exams. Stay current with your vaccines. Tell your health care provider if: You often feel depressed. You have ever been abused or do not feel safe at home. Summary Adopting a healthy lifestyle and getting preventive care are important in promoting health and wellness. Follow your health care provider's instructions about healthy diet, exercising, and getting tested or screened for diseases. Follow your health care provider's instructions on monitoring your cholesterol and blood pressure. This information is not intended to replace advice given to you by your health care provider. Make sure you discuss any questions you have with your health care provider. Document Revised: 01/30/2021 Document Reviewed: 01/30/2021 Elsevier Patient Education  2023 Elsevier Inc.  

## 2022-04-20 NOTE — Progress Notes (Deleted)
Subjective:   Roy Martinez is a 66 y.o. male who presents for an Initial Medicare Annual Wellness Visit. I connected with  Roy Martinez on 04/20/22 by a video and audio enabled telemedicine application and verified that I am speaking with the correct person using two identifiers.  Patient Location: Home  Provider Location: Home Office  I discussed the limitations of evaluation and management by telemedicine. The patient expressed understanding and agreed to proceed.  Objective:    There were no vitals filed for this visit. There is no height or weight on file to calculate BMI.     12/26/2018    4:49 PM 12/06/2018    8:49 PM 09/17/2018    3:21 PM 07/01/2017   10:04 PM 06/11/2017    2:30 PM 06/03/2017    9:15 AM 03/19/2017    4:30 PM  Advanced Directives  Does Patient Have a Medical Advance Directive? No No No No No No No  Would patient like information on creating a medical advance directive?   No - Patient declined Yes (ED - Information included in AVS)   No - Patient declined    Current Medications (verified) Outpatient Encounter Medications as of 04/20/2022  Medication Sig   Cholecalciferol (VITAMIN D3) 50 MCG (2000 UT) capsule Take 1 capsule (2,000 Units total) by mouth daily.   hydrochlorothiazide (HYDRODIURIL) 25 MG tablet Take 1 tablet (25 mg total) by mouth daily.   No facility-administered encounter medications on file as of 04/20/2022.    Allergies (verified) Penicillins   History: Past Medical History:  Diagnosis Date   Chronic hepatitis C virus infection ([place])    under treatment June 2016   H/O ETOH abuse    Hypertension    Smoker    1/2 ppd   Past Surgical History:  Procedure Laterality Date   KNEE ARTHROSCOPY     KNEE ARTHROSCOPY WITH MENISCAL REPAIR Right 06/26/2016   Procedure: KNEE ARTHROSCOPY WITH MENISCAL REPAIR VS RESECTION;  Surgeon: Meredith Pel, MD;  Location: [facility];  Service: Orthopedics;  Laterality: Right;   PATELLAR TENDON REPAIR Right  06/26/2016   Procedure: PATELLA TENDON REPAIR;  Surgeon: Meredith Pel, MD;  Location: [facility];  Service: Orthopedics;  Laterality: Right;   Family History  Problem Relation Age of Onset   Heart failure Mother    Heart disease Mother    Heart failure Father    Arrhythmia Brother    Colon cancer Neg Hx    Esophageal cancer Neg Hx    Rectal cancer Neg Hx    Stomach cancer Neg Hx    Social History   Socioeconomic History   Marital status: Single    Spouse name: Not on file   Number of children: Not on file   Years of education: Not on file   Highest education level: Not on file  Occupational History   Not on file  Tobacco Use   Smoking status: Every Day    Packs/day: 0.50    Types: Cigarettes   Smokeless tobacco: Never   Tobacco comments:    cutting back  Vaping Use   Vaping Use: Never used  Substance and Sexual Activity   Alcohol use: Yes    Alcohol/week: 5.0 - 6.0 standard drinks of alcohol    Types: 5 - 6 Cans of beer per week    Comment: pt states daily beer drinker   Drug use: Yes    Types: "Crack" cocaine    Comment: Quit 4 years ago  Sexual activity: Never  Other Topics Concern   Not on file  Social History Narrative   ** Merged History Encounter **       Social Determinants of Health   Financial Resource Strain: Not on file  Food Insecurity: Not on file  Transportation Needs: Not on file  Physical Activity: Not on file  Stress: Not on file  Social Connections: Not on file    Tobacco Counseling Ready to quit: Not Answered Counseling given: Not Answered Tobacco comments: cutting back   Clinical Intake:                 Diabetic?***         Activities of Daily Living     No data to display          Patient Care Team: Kerin Perna, NP as PCP - General (Internal Medicine)  Indicate any recent Medical Services you may have received from other than [facility] providers in the past year (date may be approximate).      Assessment:   This is a routine wellness examination for Roy Martinez.  Hearing/Vision screen No results found.  Dietary issues and exercise activities discussed:     Goals Addressed   None   Depression Screen    04/02/2022    2:44 PM 12/25/2021    2:18 PM 09/27/2021    2:49 PM 06/20/2021    2:33 PM 05/31/2020   10:56 AM 10/21/2019   10:10 AM 09/28/2019    4:12 PM  PHQ 2/9 Scores  PHQ - 2 Score 0 0 0 0 0 0 4  PHQ- 9 Score       8    Fall Risk    04/02/2022    2:44 PM 12/25/2021    2:18 PM 09/27/2021    2:49 PM 06/20/2021    2:33 PM 05/31/2020   10:56 AM  Fall Risk   Falls in the past year? 0 1 1 0 0  Number falls in past yr:  1 1    Injury with Fall?  0 0    Risk for fall due to :   No Fall Risks      FALL RISK PREVENTION PERTAINING TO THE HOME:  Any stairs in or around the home? {YES/NO:21197} If so, are there any without handrails? {YES/NO:21197} Home free of loose throw rugs in walkways, pet beds, electrical cords, etc? {YES/NO:21197} Adequate lighting in your home to reduce risk of falls? {YES/NO:21197}  ASSISTIVE DEVICES UTILIZED TO PREVENT FALLS:  Life alert? {YES/NO:21197} Use of a cane, walker or w/c? {YES/NO:21197} Grab bars in the bathroom? {YES/NO:21197} Shower chair or bench in shower? {YES/NO:21197} Elevated toilet seat or a handicapped toilet? {YES/NO:21197}  TIMED UP AND GO:  Was the test performed? {YES/NO:21197}.  Length of time to ambulate 10 feet: *** sec.   {Appearance of KKXF:8182993}  Cognitive Function:        Immunizations Immunization History  Administered Date(s) Administered   Influenza,inj,Quad PF,6+ Mos 05/31/2020, 06/20/2021   PFIZER(Purple Top)SARS-COV-2 Vaccination 12/11/2019, 01/01/2020   PNEUMOCOCCAL CONJUGATE-20 09/27/2021   Pneumococcal Polysaccharide-23 03/11/2017   Tdap 07/24/2012    {TDAP status:2101805}  {Flu Vaccine status:2101806}  {Pneumococcal vaccine status:2101807}  {Covid-19 vaccine status:2101808}  Qualifies  for Shingles Vaccine? {YES/NO:21197}  Zostavax completed {YES/NO:21197}  {Shingrix Completed?:2101804}  Screening Tests Health Maintenance  Topic Date Due   Zoster Vaccines- Shingrix (1 of 2) Never done   COVID-19 Vaccine (3 - Pfizer series) 02/26/2020   INFLUENZA VACCINE  04/24/2022  COLONOSCOPY (Pts 45-42yr Insurance coverage will need to be confirmed)  06/11/2022   TETANUS/TDAP  07/24/2022   Pneumonia Vaccine 66 Years old  Completed   Hepatitis C Screening  Completed   HIV Screening  Completed   HPV VACCINES  Aged Out    Health Maintenance  Health Maintenance Due  Topic Date Due   Zoster Vaccines- Shingrix (1 of 2) Never done   COVID-19 Vaccine (3 - Pfizer series) 02/26/2020    {Colorectal cancer screening:2101809}  Lung Cancer Screening: (Low Dose CT Chest recommended if Age 66-80years, 30 pack-year currently smoking OR have quit w/in 15years.) {DOES NOT does:27190::"does not"} qualify.   Lung Cancer Screening Referral: ***  Additional Screening:  Hepatitis C Screening: {DOES NOT does:27190::"does not"} qualify; Completed ***  Vision Screening: Recommended annual ophthalmology exams for early detection of glaucoma and other disorders of the eye. Is the patient up to date with their annual eye exam?  {YES/NO:21197} Who is the provider or what is the name of the office in which the patient attends annual eye exams? *** If pt is not established with a provider, would they like to be referred to a provider to establish care? {YES/NO:21197}.   Dental Screening: Recommended annual dental exams for proper oral hygiene  Community Resource Referral / Chronic Care Management: CRR required this visit?  {YES/NO:21197}  CCM required this visit?  {YES/NO:21197}     Plan:     I have personally reviewed and noted the following in the patient's chart:   Medical and social history Use of alcohol, tobacco or illicit drugs  Current medications and supplements including  opioid prescriptions. {Opioid Prescriptions:[phone]} Functional ability and status Nutritional status Physical activity Advanced directives List of other physicians Hospitalizations, surgeries, and ER visits in previous 12 months Vitals Screenings to include cognitive, depression, and falls Referrals and appointments  In addition, I have reviewed and discussed with patient certain preventive protocols, quality metrics, and best practice recommendations. A written personalized care plan for preventive services as well as general preventive health recommendations were provided to patient.     VDebbora Dus COregon  04/20/2022   Nurse Notes: ***

## 2022-06-13 ENCOUNTER — Encounter: Payer: Self-pay | Admitting: Gastroenterology

## 2022-06-28 ENCOUNTER — Ambulatory Visit (AMBULATORY_SURGERY_CENTER): Payer: Medicare HMO | Admitting: *Deleted

## 2022-06-28 VITALS — Ht 73.0 in | Wt 175.0 lb

## 2022-06-28 DIAGNOSIS — Z86010 Personal history of colonic polyps: Secondary | ICD-10-CM

## 2022-06-28 MED ORDER — NA SULFATE-K SULFATE-MG SULF 17.5-3.13-1.6 GM/177ML PO SOLN: 1.0000 | Freq: Once | ORAL | 0 refills | Status: AC

## 2022-06-28 NOTE — Progress Notes (Signed)
No egg or soy allergy known to patient  No issues known to pt with past sedation with any surgeries or procedures Patient denies ever being told they had issues or difficulty with intubation  No FH of Malignant Hyperthermia Pt is not on diet pills Pt is not on  home 02  Pt is not on blood thinners  Pt denies issues with constipation  No A fib or A flutter Have any cardiac testing pending--NO Pt instructed to use [email or web address].[email or web address] or GoodRx for a price reduction on prep    Explained to pt.that someone needs to be here in order for his procedure to be done,appoint mate sent out with packet,verbalized understanding.

## 2022-07-03 ENCOUNTER — Ambulatory Visit (INDEPENDENT_AMBULATORY_CARE_PROVIDER_SITE_OTHER): Payer: Medicare HMO | Admitting: Primary Care

## 2022-07-09 ENCOUNTER — Ambulatory Visit (INDEPENDENT_AMBULATORY_CARE_PROVIDER_SITE_OTHER): Payer: Medicare HMO | Admitting: Primary Care

## 2022-07-09 ENCOUNTER — Encounter (INDEPENDENT_AMBULATORY_CARE_PROVIDER_SITE_OTHER): Payer: Self-pay | Admitting: Primary Care

## 2022-07-09 VITALS — BP 123/74 | HR 61 | Temp 98.0°F | Ht 73.0 in | Wt 166.4 lb

## 2022-07-09 DIAGNOSIS — Z760 Encounter for issue of repeat prescription: Secondary | ICD-10-CM | POA: Diagnosis not present

## 2022-07-09 DIAGNOSIS — Z23 Encounter for immunization: Secondary | ICD-10-CM | POA: Diagnosis not present

## 2022-07-09 DIAGNOSIS — I10 Essential (primary) hypertension: Secondary | ICD-10-CM | POA: Diagnosis not present

## 2022-07-09 MED ORDER — HYDROCHLOROTHIAZIDE 25 MG PO TABS: 25.0000 mg | ORAL_TABLET | Freq: Every day | ORAL | 1 refills | Status: DC

## 2022-07-09 NOTE — Progress Notes (Unsigned)
Roy Martinez, is a 66 y.o. male  GHW:[id number]  VEL:[id number]  DOB - [birth year]  Chief Complaint  Patient presents with   Hypertension       Subjective:   Roy Martinez is a 66 y.o. male here today for a follow up for HTN. Patient has No headache, No chest pain, No abdominal pain - No Nausea, No new weakness tingling or numbness, No Cough - shortness of breath  No problems updated.  Allergies  Allergen Reactions   Penicillins Hives    Childhood allergy Has patient had a PCN reaction causing immediate rash, facial/tongue/throat swelling, SOB or lightheadedness with hypotension: No Has patient had a PCN reaction causing severe rash involving mucus membranes or skin necrosis: No Has patient had a PCN reaction that required hospitalization No Has patient had a PCN reaction occurring within the last 10 years: No If all of the above answers are "NO", then may proceed with Cephalosporin use.     Past Medical History:  Diagnosis Date   Chronic hepatitis C virus infection ([place])    under treatment June 2016   H/O ETOH abuse    Hypertension    Smoker    1/2 ppd    Current Outpatient Medications on File Prior to Visit  Medication Sig Dispense Refill   Cholecalciferol (VITAMIN D3) 50 MCG (2000 UT) capsule Take 1 capsule (2,000 Units total) by mouth daily. 90 capsule 1   hydrochlorothiazide (HYDRODIURIL) 25 MG tablet Take 1 tablet (25 mg total) by mouth daily. 90 tablet 1   No current facility-administered medications on file prior to visit.    Objective:   Vitals:   07/09/22 1537  BP: 123/74  Pulse: 61  Temp: 98 F (36.7 C)  TempSrc: Oral  SpO2: 98%  Weight: 166 lb 6.4 oz (75.5 kg)  Height: '6\' 1"'$  (1.854 m)    Exam General appearance : Awake, alert, not in any distress. Speech Clear. Not toxic looking HEENT: Atraumatic and Normocephalic, pupils equally reactive to light and accomodation Neck: Supple, no JVD. No cervical  lymphadenopathy.  Chest: Good air entry bilaterally, no added sounds  CVS: S1 S2 regular, no murmurs.  Abdomen: Bowel sounds present, Non tender and not distended with no gaurding, rigidity or rebound. Extremities: B/L Lower Ext shows no edema, both legs are warm to touch Neurology: Awake alert, and oriented X 3,  Non focal Skin: No Rash  Data Review Lab Results  Component Value Date   HGBA1C 5.2 12/25/2021    Assessment & Plan   Roy Martinez was seen today for hypertension.  Diagnoses and all orders for this visit:  Essential hypertension BP goal - < 140/90  Explained that having normal blood pressure is the goal and medications are helping to get to goal and maintain normal blood pressure. DIET: Limit salt intake, read nutrition labels to check salt content, limit fried and high fatty foods  Avoid using multisymptom OTC cold preparations that generally contain sudafed which can rise BP. Consult with pharmacist on best cold relief products to use for persons with HTN EXERCISE Discussed incorporating exercise such as walking - 30 minutes most days of the week and can do in 10 minute intervals    There are no diagnoses linked to this encounter. Continue   Need for immunization against influenza completed  Medication refill -     hydrochlorothiazide (HYDRODIURIL) 25 MG tablet; Take 1 tablet (25 mg total) by mouth daily.  Patient have been counseled extensively about nutrition  and exercise. Other issues discussed during this visit include: low cholesterol diet, weight control and daily exercise, foot care, annual eye examinations at Ophthalmology, importance of adherence with medications and regular follow-up. We also discussed long term complications of uncontrolled diabetes and hypertension.   The patient was given clear instructions to go to ER or return to medical center if symptoms don't improve, worsen or new problems develop. The patient verbalized understanding. The patient was told  to call to get lab results if they haven't heard anything in the next week.   This note has been created with Surveyor, quantity. Any transcriptional errors are unintentional.   Kerin Perna, NP 07/09/2022, 3:58 PM

## 2022-07-12 ENCOUNTER — Encounter: Payer: Self-pay | Admitting: Gastroenterology

## 2022-07-14 ENCOUNTER — Encounter: Payer: Self-pay | Admitting: Certified Registered Nurse Anesthetist

## 2022-07-20 ENCOUNTER — Ambulatory Visit (AMBULATORY_SURGERY_CENTER): Payer: Medicare HMO | Admitting: Gastroenterology

## 2022-07-20 ENCOUNTER — Encounter: Payer: Self-pay | Admitting: Gastroenterology

## 2022-07-20 VITALS — BP 125/67 | HR 43 | Temp 98.0°F | Resp 19 | Ht 73.0 in | Wt 175.0 lb

## 2022-07-20 DIAGNOSIS — Z09 Encounter for follow-up examination after completed treatment for conditions other than malignant neoplasm: Secondary | ICD-10-CM

## 2022-07-20 DIAGNOSIS — Z86010 Personal history of colonic polyps: Secondary | ICD-10-CM | POA: Diagnosis not present

## 2022-07-20 DIAGNOSIS — I10 Essential (primary) hypertension: Secondary | ICD-10-CM | POA: Diagnosis not present

## 2022-07-20 DIAGNOSIS — D122 Benign neoplasm of ascending colon: Secondary | ICD-10-CM | POA: Diagnosis not present

## 2022-07-20 DIAGNOSIS — D123 Benign neoplasm of transverse colon: Secondary | ICD-10-CM

## 2022-07-20 DIAGNOSIS — Z122 Encounter for screening for malignant neoplasm of respiratory organs: Secondary | ICD-10-CM

## 2022-07-20 MED ORDER — SODIUM CHLORIDE 0.9 % IV SOLN: 500.0000 mL | Freq: Once | INTRAVENOUS | Status: DC

## 2022-07-20 NOTE — Progress Notes (Signed)
Report given to PACU, vss 

## 2022-07-20 NOTE — Patient Instructions (Signed)
You will need another colonoscopy in 1 year.  Read all of the handouts given to you by your recovery room nurse.  Resume your current medications today.  YOU HAD AN ENDOSCOPIC PROCEDURE TODAY AT [facility]:   Refer to the procedure report that was given to you for any specific questions about what was found during the examination.  If the procedure report does not answer your questions, please call your gastroenterologist to clarify.  If you requested that your care partner not be given the details of your procedure findings, then the procedure report has been included in a sealed envelope for you to review at your convenience later.  YOU SHOULD EXPECT: Some feelings of bloating in the abdomen. Passage of more gas than usual.  Walking can help get rid of the air that was put into your GI tract during the procedure and reduce the bloating. If you had a lower endoscopy (such as a colonoscopy or flexible sigmoidoscopy) you may notice spotting of blood in your stool or on the toilet paper. If you underwent a bowel prep for your procedure, you may not have a normal bowel movement for a few days.  Please Note:  You might notice some irritation and congestion in your nose or some drainage.  This is from the oxygen used during your procedure.  There is no need for concern and it should clear up in a day or so.  SYMPTOMS TO REPORT IMMEDIATELY:  Following lower endoscopy (colonoscopy or flexible sigmoidoscopy):  Excessive amounts of blood in the stool  Significant tenderness or worsening of abdominal pains  Swelling of the abdomen that is new, acute  Fever of 100F or higher   For urgent or emergent issues, a gastroenterologist can be reached at any hour by calling [phone]. Do not use MyChart messaging for urgent concerns.    DIET:  We do recommend a small meal at first, but then you may proceed to your regular diet.  Drink plenty of fluids but you should avoid alcoholic beverages  for 24 hours.  ACTIVITY:  You should plan to take it easy for the rest of today and you should NOT DRIVE or use heavy machinery until tomorrow (because of the sedation medicines used during the test).    FOLLOW UP: Our staff will call the number listed on your records the next business day following your procedure.  We will call around 7:15- 8:00 am to check on you and address any questions or concerns that you may have regarding the information given to you following your procedure. If we do not reach you, we will leave a message.     If any biopsies were taken you will be contacted by phone or by letter within the next 1-3 weeks.  Please call us at [phone] if you have not heard about the biopsies in 3 weeks.    SIGNATURES/CONFIDENTIALITY: You and/or your care partner have signed paperwork which will be entered into your electronic medical record.  These signatures attest to the fact that that the information above on your After Visit Summary has been reviewed and is understood.  Full responsibility of the confidentiality of this discharge information lies with you and/or your care-partner.

## 2022-07-20 NOTE — Op Note (Signed)
[facility] Patient Name: Roy Martinez Procedure Date: 07/20/2022 2:23 PM MRN: [id number] Endoscopist: Mallie Mussel L. Loletha Carrow , MD, [id number] Age: 66 Referring MD:  Date of Birth: [birth year] Gender: Male Account #: [id number] Procedure:                Colonoscopy Indications:              Surveillance: Personal history of adenomatous                            polyps on last colonoscopy 5 years ago                           Diminutive TA x 2 Sept 2018 Medicines:                Monitored Anesthesia Care Procedure:                Pre-Anesthesia Assessment:                           - Prior to the procedure, a History and Physical                            was performed, and patient medications and                            allergies were reviewed. The patient's tolerance of                            previous anesthesia was also reviewed. The risks                            and benefits of the procedure and the sedation                            options and risks were discussed with the patient.                            All questions were answered, and informed consent                            was obtained. Prior Anticoagulants: The patient has                            taken no anticoagulant or antiplatelet agents. ASA                            Grade Assessment: II - A patient with mild systemic                            disease. After reviewing the risks and benefits,                            the patient was deemed in satisfactory condition to  undergo the procedure.                           After obtaining informed consent, the colonoscope                            was passed under direct vision. Throughout the                            procedure, the patient's blood pressure, pulse, and                            oxygen saturations were monitored continuously. The                            CF [id number] #[id number] was introduced through the  anus                            and advanced to the the cecum, identified by                            appendiceal orifice and ileocecal valve. The                            colonoscopy was somewhat difficult due to poor                            bowel prep. Successful completion of the procedure                            was aided by lavage. The patient tolerated the                            procedure well. The quality of the bowel                            preparation was poor. The ileocecal valve,                            appendiceal orifice, and rectum were photographed.                            The bowel preparation used was SUPREP (however,                            patient misunderstood instructions, took AM dose                            too late and without additional water). Scope In: 2:36:08 PM Scope Out: 2:58:41 PM Scope Withdrawal Time: 0 hours 15 minutes 32 seconds  Total Procedure Duration: 0 hours 22 minutes 33 seconds  Findings:                 The perianal and digital rectal examinations were  normal.                           Repeat examination of right colon under NBI                            performed.                           A large amount of liquid stool was found in the                            entire colon, interfering with visualization.                            Lavage of the area was performed using a large                            amount, resulting in incomplete clearance with fair                            visualization.                           Two sessile polyps were found in the transverse                            colon and ascending colon. The polyps were 3 to 6                            mm in size. These polyps were removed with a cold                            snare. Resection and retrieval were complete.                           Internal hemorrhoids were found.                           The exam  was otherwise without abnormality on                            direct and retroflexion views. Complications:            No immediate complications. Estimated Blood Loss:     Estimated blood loss was minimal. Impression:               - Preparation of the colon was poor.                           - Stool in the entire examined colon.                           - Two 3 to 6 mm polyps in the transverse colon and  in the ascending colon, removed with a cold snare.                            Resected and retrieved.                           - Internal hemorrhoids.                           - The examination was otherwise normal on direct                            and retroflexion views. Recommendation:           - Patient has a contact number available for                            emergencies. The signs and symptoms of potential                            delayed complications were discussed with the                            patient. Return to normal activities tomorrow.                            Written discharge instructions were provided to the                            patient.                           - Resume previous diet.                           - Continue present medications.                           - Await pathology results.                           - Repeat colonoscopy in 1 year for surveillance.                            For the next exam, spilt-dose golytely and closer                            attention to prep instructions and timing. Takeyla Million L. Loletha Carrow, MD 07/20/2022 3:03:18 PM This report has been signed electronically.

## 2022-07-20 NOTE — Progress Notes (Signed)
Pt's states no medical or surgical changes since previsit or office visit. 

## 2022-07-20 NOTE — Progress Notes (Signed)
Called to room to assist during endoscopic procedure.  Patient ID and intended procedure confirmed with present staff. Received instructions for my participation in the procedure from the performing physician.  

## 2022-07-20 NOTE — Progress Notes (Signed)
History and Physical:  This patient presents for endoscopic testing for: Encounter Diagnosis  Name Primary?   Personal history of colonic polyps Yes    Diminutive TA Sept 2018 (prep initially fair on that exam before lavage) On this occasion, patient misunderstood and took AM prep dose 3 hours before procedure time.  Patient is otherwise without complaints or active issues today.   Past Medical History: Past Medical History:  Diagnosis Date   Chronic hepatitis C virus infection ([place])    under treatment June 2016   H/O ETOH abuse    Hypertension    Smoker    1/2 ppd     Past Surgical History: Past Surgical History:  Procedure Laterality Date   COLONOSCOPY     KNEE ARTHROSCOPY     KNEE ARTHROSCOPY WITH MENISCAL REPAIR Right 06/26/2016   Procedure: KNEE ARTHROSCOPY WITH MENISCAL REPAIR VS RESECTION;  Surgeon: Meredith Pel, MD;  Location: [facility];  Service: Orthopedics;  Laterality: Right;   PATELLAR TENDON REPAIR Right 06/26/2016   Procedure: PATELLA TENDON REPAIR;  Surgeon: Meredith Pel, MD;  Location: [place];  Service: Orthopedics;  Laterality: Right;   POLYPECTOMY      Allergies: Allergies  Allergen Reactions   Penicillins Hives    Childhood allergy Has patient had a PCN reaction causing immediate rash, facial/tongue/throat swelling, SOB or lightheadedness with hypotension: No Has patient had a PCN reaction causing severe rash involving mucus membranes or skin necrosis: No Has patient had a PCN reaction that required hospitalization No Has patient had a PCN reaction occurring within the last 10 years: No If all of the above answers are "NO", then may proceed with Cephalosporin use.     Outpatient Meds: Current Outpatient Medications  Medication Sig Dispense Refill   Cholecalciferol (VITAMIN D3) 50 MCG (2000 UT) capsule Take 1 capsule (2,000 Units total) by mouth daily. 90 capsule 1   hydrochlorothiazide (HYDRODIURIL) 25 MG tablet Take 1 tablet (25 mg  total) by mouth daily. 90 tablet 1   Current Facility-Administered Medications  Medication Dose Route Frequency Provider Last Rate Last Admin   0.9 %  sodium chloride infusion  500 mL Intravenous Once Doran Stabler, MD          ___________________________________________________________________ Objective   Exam:  BP 127/87   Pulse (!) 57   Temp 98 F (36.7 C)   Ht '6\' 1"'$  (1.854 m)   Wt 175 lb (79.4 kg)   SpO2 97%   BMI 23.09 kg/m   CV: regular , S1/S2 Resp: clear to auscultation bilaterally, normal RR and effort noted GI: soft, no tenderness, with active bowel sounds.   Assessment: Encounter Diagnosis  Name Primary?   Personal history of colonic polyps Yes     Plan: Colonoscopy  The benefits and risks of the planned procedure were described in detail with the patient or (when appropriate) their health care proxy.  Risks were outlined as including, but not limited to, bleeding, infection, perforation, adverse medication reaction leading to cardiac or pulmonary decompensation, pancreatitis (if ERCP).  The limitation of incomplete mucosal visualization was also discussed.  No guarantees or warranties were given.    The patient is appropriate for an endoscopic procedure in the ambulatory setting.   - Wilfrid Lund, MD

## 2022-07-23 ENCOUNTER — Telehealth: Payer: Self-pay

## 2022-07-23 NOTE — Telephone Encounter (Signed)
  Follow up Call-     07/20/2022    2:19 PM  Call back number  Post procedure Call Back phone  # [phone]  Permission to leave phone message Yes     Patient questions:  Do you have a fever, pain , or abdominal swelling? No. Pain Score  0 *  Have you tolerated food without any problems? Yes.    Have you been able to return to your normal activities? Yes.    Do you have any questions about your discharge instructions: Diet   No. Medications  No. Follow up visit  No.  Do you have questions or concerns about your Care? No.  Actions: * If pain score is 4 or above: No action needed, pain <4.

## 2022-07-27 ENCOUNTER — Encounter: Payer: Self-pay | Admitting: Gastroenterology

## 2022-07-31 ENCOUNTER — Ambulatory Visit (INDEPENDENT_AMBULATORY_CARE_PROVIDER_SITE_OTHER): Payer: Medicare HMO | Admitting: Primary Care

## 2022-08-23 DIAGNOSIS — Z8249 Family history of ischemic heart disease and other diseases of the circulatory system: Secondary | ICD-10-CM | POA: Diagnosis not present

## 2022-08-23 DIAGNOSIS — G8929 Other chronic pain: Secondary | ICD-10-CM | POA: Diagnosis not present

## 2022-08-23 DIAGNOSIS — I10 Essential (primary) hypertension: Secondary | ICD-10-CM | POA: Diagnosis not present

## 2022-08-23 DIAGNOSIS — R69 Illness, unspecified: Secondary | ICD-10-CM | POA: Diagnosis not present

## 2022-08-23 DIAGNOSIS — Z880 Allergy status to penicillin: Secondary | ICD-10-CM | POA: Diagnosis not present

## 2022-08-23 DIAGNOSIS — Z9181 History of falling: Secondary | ICD-10-CM | POA: Diagnosis not present

## 2022-09-10 ENCOUNTER — Other Ambulatory Visit: Payer: Self-pay | Admitting: Surgical

## 2022-09-25 DIAGNOSIS — Z720 Tobacco use: Secondary | ICD-10-CM | POA: Diagnosis not present

## 2022-09-25 DIAGNOSIS — I739 Peripheral vascular disease, unspecified: Secondary | ICD-10-CM | POA: Diagnosis not present

## 2022-09-25 DIAGNOSIS — I10 Essential (primary) hypertension: Secondary | ICD-10-CM | POA: Diagnosis not present

## 2022-09-25 DIAGNOSIS — Z008 Encounter for other general examination: Secondary | ICD-10-CM | POA: Diagnosis not present

## 2022-10-10 ENCOUNTER — Encounter (INDEPENDENT_AMBULATORY_CARE_PROVIDER_SITE_OTHER): Payer: Self-pay | Admitting: Primary Care

## 2022-10-10 ENCOUNTER — Ambulatory Visit (INDEPENDENT_AMBULATORY_CARE_PROVIDER_SITE_OTHER): Payer: Medicare HMO | Admitting: Primary Care

## 2022-10-10 VITALS — BP 125/81 | HR 60 | Resp 16 | Ht 73.0 in | Wt 170.4 lb

## 2022-10-10 DIAGNOSIS — I10 Essential (primary) hypertension: Secondary | ICD-10-CM

## 2022-10-10 DIAGNOSIS — E559 Vitamin D deficiency, unspecified: Secondary | ICD-10-CM | POA: Diagnosis not present

## 2022-10-10 DIAGNOSIS — F17210 Nicotine dependence, cigarettes, uncomplicated: Secondary | ICD-10-CM | POA: Diagnosis not present

## 2022-10-10 DIAGNOSIS — Z23 Encounter for immunization: Secondary | ICD-10-CM | POA: Diagnosis not present

## 2022-10-10 DIAGNOSIS — Z760 Encounter for issue of repeat prescription: Secondary | ICD-10-CM

## 2022-10-10 DIAGNOSIS — F1010 Alcohol abuse, uncomplicated: Secondary | ICD-10-CM | POA: Diagnosis not present

## 2022-10-10 DIAGNOSIS — F17200 Nicotine dependence, unspecified, uncomplicated: Secondary | ICD-10-CM

## 2022-10-10 DIAGNOSIS — R69 Illness, unspecified: Secondary | ICD-10-CM | POA: Diagnosis not present

## 2022-10-10 NOTE — Progress Notes (Signed)
[facility]   [name] is a 67 y.o. male presents for hypertension evaluation, Denies shortness of breath, headaches, chest pain or lower extremity edema, sudden onset, vision changes, unilateral weakness, dizziness, paresthesias . He has concerns about his feet being cold and the same lipoma on fore head wants remove.  Patient reports adherence with medications.  Dietary habits include: most of the time Exercise habits include:walking  Family / Social history: Father CVA, brother (1/2) CVA   Past Medical History:  Diagnosis Date   Chronic hepatitis C virus infection ([place])    under treatment June 2016   H/O ETOH abuse    Hypertension    Smoker    1/2 ppd   Past Surgical History:  Procedure Laterality Date   COLONOSCOPY     KNEE ARTHROSCOPY     KNEE ARTHROSCOPY WITH MENISCAL REPAIR Right 06/26/2016   Procedure: KNEE ARTHROSCOPY WITH MENISCAL REPAIR VS RESECTION;  Surgeon: Meredith Pel, MD;  Location: [place];  Service: Orthopedics;  Laterality: Right;   PATELLAR TENDON REPAIR Right 06/26/2016   Procedure: PATELLA TENDON REPAIR;  Surgeon: Meredith Pel, MD;  Location: [place];  Service: Orthopedics;  Laterality: Right;   POLYPECTOMY     Allergies  Allergen Reactions   Penicillins Hives    Childhood allergy Has patient had a PCN reaction causing immediate rash, facial/tongue/throat swelling, SOB or lightheadedness with hypotension: No Has patient had a PCN reaction causing severe rash involving mucus membranes or skin necrosis: No Has patient had a PCN reaction that required hospitalization No Has patient had a PCN reaction occurring within the last 10 years: No If all of the above answers are "NO", then may proceed with Cephalosporin use.    Current Outpatient Medications on File Prior to Visit  Medication Sig Dispense Refill   Cholecalciferol (VITAMIN D3) 50 MCG (2000 UT) capsule Take 1 capsule (2,000 Units total) by mouth daily. 90 capsule  1   hydrochlorothiazide (HYDRODIURIL) 25 MG tablet Take 1 tablet (25 mg total) by mouth daily. 90 tablet 1   meloxicam (MOBIC) 15 MG tablet TAKE 1 TABLET(15 MG) BY MOUTH DAILY 90 tablet 0   No current facility-administered medications on file prior to visit.     Family History  Problem Relation Age of Onset   Heart failure Mother    Heart disease Mother    Heart failure Father    Arrhythmia Brother    Colon cancer Neg Hx    Esophageal cancer Neg Hx    Rectal cancer Neg Hx    Stomach cancer Neg Hx    Colon polyps Neg Hx    Crohn's disease Neg Hx    Ulcerative colitis Neg Hx      OBJECTIVE:  Vitals:   10/10/22 1511  BP: 125/81  Pulse: 60  Resp: 16  SpO2: 97%  Weight: 170 lb 6.4 oz (77.3 kg)  Height: '6\' 1"'$  (1.854 m)    Physical Exam Vitals reviewed.  Constitutional:      Appearance: Normal appearance. He is normal weight.  HENT:     Head: Normocephalic.     Right Ear: Tympanic membrane and external ear normal.     Left Ear: Tympanic membrane and external ear normal.     Nose: Nose normal.  Eyes:     Extraocular Movements: Extraocular movements intact.  Cardiovascular:     Rate and Rhythm: Normal rate and regular rhythm.  Pulmonary:     Effort: Pulmonary effort is normal.  Breath sounds: Normal breath sounds.  Abdominal:     General: Abdomen is flat. Bowel sounds are normal.     Palpations: Abdomen is soft.  Musculoskeletal:        General: Normal range of motion.     Cervical back: Normal range of motion.  Skin:    General: Skin is warm and dry.  Neurological:     Mental Status: He is alert and oriented to person, place, and time.  Psychiatric:        Mood and Affect: Mood normal.        Behavior: Behavior normal.    ROS Comprehensive ROS Pertinent positive and negative noted in HPI   Last 3 Office BP readings: BP Readings from Last 3 Encounters:  10/10/22 125/81  07/20/22 125/67  07/09/22 123/74    BMET    Component Value Date/Time   NA  138 12/25/2021 1449   K 3.8 12/25/2021 1449   CL 100 12/25/2021 1449   CO2 22 12/25/2021 1449   GLUCOSE 98 12/25/2021 1449   GLUCOSE 113 (H) 12/28/2017 1426   BUN 16 12/25/2021 1449   CREATININE 0.78 12/25/2021 1449   CREATININE 0.72 03/11/2017 0953   CALCIUM 9.4 12/25/2021 1449   GFRNONAA 98 05/31/2020 1128   GFRNONAA >89 03/11/2017 0953   GFRAA 113 05/31/2020 1128   GFRAA >89 03/11/2017 0953    Renal function: CrCl cannot be calculated (Patient's most recent lab result is older than the maximum 21 days allowed.).  Clinical ASCVD: Yes  The 10-year ASCVD risk score (Arnett DK, et al., 2019) is: 22.8%   Values used to calculate the score:     Age: 89 years     Sex: Male     Is Non-Hispanic African American: Yes     Diabetic: No     Tobacco smoker: Yes     Systolic Blood Pressure: 644 mmHg     Is BP treated: Yes     HDL Cholesterol: 78 mg/dL     Total Cholesterol: 189 mg/dL  ASCVD risk factors include- [name]   [name] was seen today for hypertension.  Diagnoses and all orders for this visit:  Smoker - I have recommended complete cessation of tobacco use. I have discussed various options available for assistance with tobacco cessation including over the counter methods (Nicotine gum, patch and lozenges). We also discussed prescription options (Chantix, Nicotine Inhaler / Nasal Spray). The patient is not interested in pursuing any prescription tobacco cessation options at this time. - Patient declines at this time.  - Less than 5 minutes spent on counseling.   ETOH abuse Continues to consumes of alcohol   Vitamin D deficiency -     Vitamin D, 25-hydroxy  Other orders -     Tdap vaccine greater than or equal to 7yo IM    Essential hypertension -Counseled on lifestyle modifications for blood pressure control including reduced dietary sodium, increased exercise, weight reduction and adequate sleep. Also, educated patient about the risk for cardiovascular events, stroke  and heart attack. Also counseled patient about the importance of medication adherence. If you participate in smoking, it is important to stop using tobacco as this will increase the risks associated with uncontrolled blood pressure.   Goal BP:  For patients younger than 60: Goal BP < 130/80. For patients 60 and older: Goal BP < 140/90. For patients with diabetes: Goal BP < 130/80. Your most recent BP: 125/81  Minimize salt intake. Minimize alcohol intake  This note has been created with Surveyor, quantity. Any transcriptional errors are unintentional.   Kerin Perna, NP 10/10/2022, 3:41 PM

## 2022-10-11 LAB — VITAMIN D 25 HYDROXY (VIT D DEFICIENCY, FRACTURES): Vit D, 25-Hydroxy: 24.2 ng/mL — ABNORMAL LOW (ref 30.0–100.0)

## 2022-10-17 ENCOUNTER — Ambulatory Visit (INDEPENDENT_AMBULATORY_CARE_PROVIDER_SITE_OTHER): Payer: Medicare HMO | Admitting: Primary Care

## 2022-10-25 ENCOUNTER — Encounter (INDEPENDENT_AMBULATORY_CARE_PROVIDER_SITE_OTHER): Payer: Self-pay

## 2022-10-30 ENCOUNTER — Other Ambulatory Visit (INDEPENDENT_AMBULATORY_CARE_PROVIDER_SITE_OTHER): Payer: Self-pay | Admitting: Primary Care

## 2022-10-30 DIAGNOSIS — Z760 Encounter for issue of repeat prescription: Secondary | ICD-10-CM

## 2022-10-30 DIAGNOSIS — I10 Essential (primary) hypertension: Secondary | ICD-10-CM

## 2022-11-02 ENCOUNTER — Telehealth (INDEPENDENT_AMBULATORY_CARE_PROVIDER_SITE_OTHER): Payer: Self-pay

## 2022-11-02 NOTE — Telephone Encounter (Signed)
Pt given lab results per notes of M. Edwards NP on 11/02/22. Pt verbalized understanding.

## 2023-01-11 ENCOUNTER — Encounter (INDEPENDENT_AMBULATORY_CARE_PROVIDER_SITE_OTHER): Payer: Self-pay | Admitting: Primary Care

## 2023-01-11 ENCOUNTER — Other Ambulatory Visit (INDEPENDENT_AMBULATORY_CARE_PROVIDER_SITE_OTHER): Payer: Self-pay

## 2023-01-11 ENCOUNTER — Ambulatory Visit (INDEPENDENT_AMBULATORY_CARE_PROVIDER_SITE_OTHER): Payer: Medicare HMO | Admitting: Primary Care

## 2023-01-11 VITALS — BP 118/78 | HR 53 | Resp 16 | Wt 169.2 lb

## 2023-01-11 DIAGNOSIS — I10 Essential (primary) hypertension: Secondary | ICD-10-CM | POA: Diagnosis not present

## 2023-01-11 DIAGNOSIS — Z131 Encounter for screening for diabetes mellitus: Secondary | ICD-10-CM

## 2023-01-11 DIAGNOSIS — Z833 Family history of diabetes mellitus: Secondary | ICD-10-CM | POA: Diagnosis not present

## 2023-01-11 DIAGNOSIS — Z13220 Encounter for screening for lipoid disorders: Secondary | ICD-10-CM | POA: Diagnosis not present

## 2023-01-11 MED ORDER — ZOSTER VAC RECOMB ADJUVANTED 50 MCG/0.5ML IM SUSR: 0.5000 mL | Freq: Once | INTRAMUSCULAR | 1 refills | Status: AC

## 2023-01-11 NOTE — Progress Notes (Signed)
[facility]   [name] is a 67 y.o. male presents for hypertension evaluation, Denies shortness of breath, headaches, chest pain or lower extremity edema, sudden onset, vision changes, unilateral weakness, dizziness, paresthesias .He voices no complaints or concerns today. Patient reports adherence with medications.  Dietary habits include: most of the time Exercise habits include:walking  Family / Social history: Father CVA, brother (1/2) CVA   Past Medical History:  Diagnosis Date   Chronic hepatitis C virus infection (HCC)    under treatment June 2016   H/O ETOH abuse    Hypertension    Smoker    1/2 ppd   Past Surgical History:  Procedure Laterality Date   COLONOSCOPY     KNEE ARTHROSCOPY     KNEE ARTHROSCOPY WITH MENISCAL REPAIR Right 06/26/2016   Procedure: KNEE ARTHROSCOPY WITH MENISCAL REPAIR VS RESECTION;  Surgeon: Cammy Copa, MD;  Location: [facility] OR;  Service: Orthopedics;  Laterality: Right;   PATELLAR TENDON REPAIR Right 06/26/2016   Procedure: PATELLA TENDON REPAIR;  Surgeon: Cammy Copa, MD;  Location: [facility] OR;  Service: Orthopedics;  Laterality: Right;   POLYPECTOMY     Allergies  Allergen Reactions   Penicillins Hives    Childhood allergy Has patient had a PCN reaction causing immediate rash, facial/tongue/throat swelling, SOB or lightheadedness with hypotension: No Has patient had a PCN reaction causing severe rash involving mucus membranes or skin necrosis: No Has patient had a PCN reaction that required hospitalization No Has patient had a PCN reaction occurring within the last 10 years: No If all of the above answers are "NO", then may proceed with Cephalosporin use.    Current Outpatient Medications on File Prior to Visit  Medication Sig Dispense Refill   Cholecalciferol (VITAMIN D3) 50 MCG (2000 UT) capsule Take 1 capsule (2,000 Units total) by mouth daily. 90 capsule 1   hydrochlorothiazide (HYDRODIURIL) 25 MG  tablet TAKE 1 TABLET(25 MG) BY MOUTH DAILY 90 tablet 1   meloxicam (MOBIC) 15 MG tablet TAKE 1 TABLET(15 MG) BY MOUTH DAILY 90 tablet 0   No current facility-administered medications on file prior to visit.     Family History  Problem Relation Age of Onset   Heart failure Mother    Heart disease Mother    Heart failure Father    Arrhythmia Brother    Colon cancer Neg Hx    Esophageal cancer Neg Hx    Rectal cancer Neg Hx    Stomach cancer Neg Hx    Colon polyps Neg Hx    Crohn's disease Neg Hx    Ulcerative colitis Neg Hx      OBJECTIVE: Blood Pressure (Abnormal) 142/78   Pulse (Abnormal) 53   Respiration 16   Weight 169 lb 3.2 oz (76.7 kg)   Oxygen Saturation 99%   Body Mass Index 22.32 kg/m     Physical Exam Vitals reviewed.  Constitutional:      Appearance: Normal appearance. He is normal weight.  HENT:     Head: Normocephalic.     Right Ear: Tympanic membrane and external ear normal.     Left Ear: Tympanic membrane and external ear normal.     Nose: Nose normal.  Eyes:     Extraocular Movements: Extraocular movements intact.  Cardiovascular:     Rate and Rhythm: Normal rate and regular rhythm.  Pulmonary:     Effort: Pulmonary effort is normal.     Breath sounds: Normal breath sounds.  Abdominal:  General: Abdomen is flat. Bowel sounds are normal.     Palpations: Abdomen is soft.  Musculoskeletal:        General: Normal range of motion.     Cervical back: Normal range of motion.  Skin:    General: Skin is warm and dry.  Neurological:     Mental Status: He is alert and oriented to person, place, and time.  Psychiatric:        Mood and Affect: Mood normal.        Behavior: Behavior normal.    ROS Comprehensive ROS Pertinent positive and negative noted in HPI   Last 3 Office BP readings: BP Readings from Last 3 Encounters:  10/10/22 125/81  07/20/22 125/67  07/09/22 123/74    BMET    Component Value Date/Time   NA 138 12/25/2021 1449    K 3.8 12/25/2021 1449   CL 100 12/25/2021 1449   CO2 22 12/25/2021 1449   GLUCOSE 98 12/25/2021 1449   GLUCOSE 113 (H) 12/28/2017 1426   BUN 16 12/25/2021 1449   CREATININE 0.78 12/25/2021 1449   CREATININE 0.72 03/11/2017 0953   CALCIUM 9.4 12/25/2021 1449   GFRNONAA 98 05/31/2020 1128   GFRNONAA >89 03/11/2017 0953   GFRAA 113 05/31/2020 1128   GFRAA >89 03/11/2017 0953    Renal function: CrCl cannot be calculated (Patient's most recent lab result is older than the maximum 21 days allowed.).  Clinical ASCVD: Yes  The 10-year ASCVD risk score (Arnett DK, et al., 2019) is: 22.8%   Values used to calculate the score:     Age: 89 years     Sex: Male     Is Non-Hispanic African American: Yes     Diabetic: No     Tobacco smoker: Yes     Systolic Blood Pressure: 125 mmHg     Is BP treated: Yes     HDL Cholesterol: 78 mg/dL     Total Cholesterol: 189 mg/dL  ASCVD risk factors include- [place]   Hoyle was seen today for hypertension.  Diagnoses and all orders for this visit:  ETOH abuse Continues to consumes of alcohol   Essential hypertension -Counseled on lifestyle modifications for blood pressure control including reduced dietary sodium, increased exercise, weight reduction and adequate sleep. Also, educated patient about the risk for cardiovascular events, stroke and heart attack. Also counseled patient about the importance of medication adherence. If you participate in smoking, it is important to stop using tobacco as this will increase the risks associated with uncontrolled blood pressure.   Goal BP:  For patients younger than 60: Goal BP < 130/80. For patients 60 and older: Goal BP < 140/90. For patients with diabetes: Goal BP < 130/80. Your most recent BP: 142/78 Minimize salt intake. Minimize alcohol intake  This note has been created with Education officer, environmental. Any transcriptional errors are unintentional.   Grayce Sessions, NP 01/11/2023, 8:43 AM

## 2023-01-12 LAB — CBC WITH DIFFERENTIAL/PLATELET
Basophils Absolute: 0 10*3/uL (ref 0.0–0.2)
Basos: 1 %
EOS (ABSOLUTE): 0.2 10*3/uL (ref 0.0–0.4)
Eos: 3 %
Hematocrit: 39.9 % (ref 37.5–51.0)
Hemoglobin: 12.9 g/dL — ABNORMAL LOW (ref 13.0–17.7)
Immature Grans (Abs): 0 10*3/uL (ref 0.0–0.1)
Immature Granulocytes: 0 %
Lymphocytes Absolute: 1.8 10*3/uL (ref 0.7–3.1)
Lymphs: 32 %
MCH: 30.2 pg (ref 26.6–33.0)
MCHC: 32.3 g/dL (ref 31.5–35.7)
MCV: 93 fL (ref 79–97)
Monocytes Absolute: 0.9 10*3/uL (ref 0.1–0.9)
Monocytes: 16 %
Neutrophils Absolute: 2.7 10*3/uL (ref 1.4–7.0)
Neutrophils: 48 %
Platelets: 411 10*3/uL (ref 150–450)
RBC: 4.27 x10E6/uL (ref 4.14–5.80)
RDW: 12.3 % (ref 11.6–15.4)
WBC: 5.7 10*3/uL (ref 3.4–10.8)

## 2023-01-12 LAB — COMPREHENSIVE METABOLIC PANEL
ALT: 17 IU/L (ref 0–44)
AST: 22 IU/L (ref 0–40)
Albumin/Globulin Ratio: 1.6 (ref 1.2–2.2)
Albumin: 4.1 g/dL (ref 3.9–4.9)
Alkaline Phosphatase: 72 IU/L (ref 44–121)
BUN/Creatinine Ratio: 22 (ref 10–24)
BUN: 14 mg/dL (ref 8–27)
Bilirubin Total: 0.4 mg/dL (ref 0.0–1.2)
CO2: 24 mmol/L (ref 20–29)
Calcium: 9.1 mg/dL (ref 8.6–10.2)
Chloride: 98 mmol/L (ref 96–106)
Creatinine, Ser: 0.64 mg/dL — ABNORMAL LOW (ref 0.76–1.27)
Globulin, Total: 2.6 g/dL (ref 1.5–4.5)
Glucose: 99 mg/dL (ref 70–99)
Potassium: 3.5 mmol/L (ref 3.5–5.2)
Sodium: 138 mmol/L (ref 134–144)
Total Protein: 6.7 g/dL (ref 6.0–8.5)
eGFR: 104 mL/min/{1.73_m2} (ref >59)

## 2023-01-12 LAB — LIPID PANEL
Chol/HDL Ratio: 2.7 ratio (ref 0.0–5.0)
Cholesterol, Total: 164 mg/dL (ref 100–199)
HDL: 61 mg/dL (ref >39)
LDL Chol Calc (NIH): 88 mg/dL (ref 0–99)
Triglycerides: 79 mg/dL (ref 0–149)
VLDL Cholesterol Cal: 15 mg/dL (ref 5–40)

## 2023-01-12 LAB — HEMOGLOBIN A1C
Est. average glucose Bld gHb Est-mCnc: 111 mg/dL
Hgb A1c MFr Bld: 5.5 % (ref 4.8–5.6)

## 2023-01-15 ENCOUNTER — Encounter (INDEPENDENT_AMBULATORY_CARE_PROVIDER_SITE_OTHER): Payer: Self-pay

## 2023-01-18 ENCOUNTER — Telehealth (INDEPENDENT_AMBULATORY_CARE_PROVIDER_SITE_OTHER): Payer: Self-pay

## 2023-01-18 NOTE — Telephone Encounter (Signed)
Contacted pt to go over lab results pt is aware and doesn't have any questions or concerns 

## 2023-01-30 ENCOUNTER — Telehealth (INDEPENDENT_AMBULATORY_CARE_PROVIDER_SITE_OTHER): Payer: Self-pay | Admitting: Primary Care

## 2023-01-30 NOTE — Telephone Encounter (Signed)
Called patient to schedule Medicare Annual Wellness Visit (AWV). Left message for patient to call back and schedule Medicare Annual Wellness Visit (AWV).  Last date of AWV: due 09/24/2009 awvi per [facility]  Please schedule an appointment at any time with Abby, NHA. .  If any questions, please contact me at [phone].  Thank you,  Stephanie,  AMB Clinical Support [facility] AWV Program Direct Dial ??[phone]    

## 2023-02-12 ENCOUNTER — Ambulatory Visit (INDEPENDENT_AMBULATORY_CARE_PROVIDER_SITE_OTHER): Payer: Medicare HMO | Admitting: Primary Care

## 2023-02-12 ENCOUNTER — Encounter (INDEPENDENT_AMBULATORY_CARE_PROVIDER_SITE_OTHER): Payer: Self-pay | Admitting: Primary Care

## 2023-02-12 VITALS — BP 120/79 | HR 56 | Resp 16 | Ht 73.0 in | Wt 169.4 lb

## 2023-02-12 DIAGNOSIS — Z0000 Encounter for general adult medical examination without abnormal findings: Secondary | ICD-10-CM

## 2023-02-12 NOTE — Progress Notes (Signed)
Subjective:   Roy Martinez is a 67 y.o. male who presents for a Welcome to Medicare exam.   Review of Systems: Comprehensive ROS Pertinent positive and negative noted in HPI         Objective:    Today's Vitals   02/12/23 1052  BP: 120/79  Pulse: (!) 56  Resp: 16  SpO2: 99%  Weight: 169 lb 6.4 oz (76.8 kg)  Height: 6\' 1"  (1.854 m)   Body mass index is 22.35 kg/m.  Medications Outpatient Encounter Medications as of 02/12/2023  Medication Sig   Cholecalciferol (VITAMIN D3) 50 MCG (2000 UT) capsule Take 1 capsule (2,000 Units total) by mouth daily.   hydrochlorothiazide (HYDRODIURIL) 25 MG tablet TAKE 1 TABLET(25 MG) BY MOUTH DAILY   meloxicam (MOBIC) 15 MG tablet TAKE 1 TABLET(15 MG) BY MOUTH DAILY   No facility-administered encounter medications on file as of 02/12/2023.     History: Past Medical History:  Diagnosis Date   Chronic hepatitis C virus infection (HCC)    under treatment June 2016   H/O ETOH abuse    Hypertension    Smoker    1/2 ppd   Past Surgical History:  Procedure Laterality Date   COLONOSCOPY     KNEE ARTHROSCOPY     KNEE ARTHROSCOPY WITH MENISCAL REPAIR Right 06/26/2016   Procedure: KNEE ARTHROSCOPY WITH MENISCAL REPAIR VS RESECTION;  Surgeon: Cammy Copa, MD;  Location: [facility] OR;  Service: Orthopedics;  Laterality: Right;   PATELLAR TENDON REPAIR Right 06/26/2016   Procedure: PATELLA TENDON REPAIR;  Surgeon: Cammy Copa, MD;  Location: [facility] OR;  Service: Orthopedics;  Laterality: Right;   POLYPECTOMY      Family History  Problem Relation Age of Onset   Heart failure Mother    Heart disease Mother    Heart failure Father    Arrhythmia Brother    Colon cancer Neg Hx    Esophageal cancer Neg Hx    Rectal cancer Neg Hx    Stomach cancer Neg Hx    Colon polyps Neg Hx    Crohn's disease Neg Hx    Ulcerative colitis Neg Hx    Social History   Occupational History   Not on file  Tobacco Use   Smoking status: Every Day     Packs/day: .5    Types: Cigarettes    Passive exposure: Never   Smokeless tobacco: Never   Tobacco comments:    cutting back  Vaping Use   Vaping Use: Never used  Substance and Sexual Activity   Alcohol use: Yes    Alcohol/week: 5.0 - 6.0 standard drinks of alcohol    Types: 5 - 6 Cans of beer per week    Comment: pt states daily beer drinker,STATES NOT MUCH UPDATED 06/28/22   Drug use: Yes    Types: "Crack" cocaine    Comment: Quit 4 years ago   Sexual activity: Never   Tobacco Counseling Ready to quit: Not Answered Counseling given: Not Answered Tobacco comments: cutting back   Immunizations and Health Maintenance Immunization History  Administered Date(s) Administered   Influenza,inj,Quad PF,6+ Mos 05/31/2020, 06/20/2021, 07/09/2022   PFIZER(Purple Top)SARS-COV-2 Vaccination 12/11/2019, 01/01/2020   PNEUMOCOCCAL CONJUGATE-20 09/27/2021   Pneumococcal Polysaccharide-23 03/11/2017   Tdap 07/24/2012, 10/10/2022   Health Maintenance Due  Topic Date Due   Zoster Vaccines- Shingrix (1 of 2) Never done   COVID-19 Vaccine (3 - 2023-24 season) 05/25/2022    Activities of Daily Living  02/12/2023   10:52 AM  In your present state of health, do you have any difficulty performing the following activities:  Hearing? 0  Vision? 0  Difficulty concentrating or making decisions? 0  Walking or climbing stairs? 0  Dressing or bathing? 0  Doing errands, shopping? 0  Preparing Food and eating ? N  Using the Toilet? N  In the past six months, have you accidently leaked urine? N  Do you have problems with loss of bowel control? N  Managing your Medications? N  Managing your Finances? N    Physical Exam   General: No apparent distress. Eyes: Extraocular eye movements intact, pupils equal and round. Neck: Supple, trachea midline. Thyroid: No enlargement, mobile without fixation, no tenderness. Cardiovascular: Regular rhythm and rate, no murmur, normal radial  pulses. Respiratory: Normal respiratory effort, clear to auscultation. Gastrointestinal: Normal pitch active bowel sounds, nontender abdomen without distention or appreciable hepatomegaly. Musculoskeletal: Normal muscle tone, no tenderness on palpation of tibia, present thoracic kyphosis. Skin: Appropriate warmth, no visible rash. Mental status: Alert, conversant, speech clear, thought logical, appropriate mood and affect, no hallucinations or delusions evident. Hematologic/lymphatic: No cervical adenopathy, no visible ecchymoses.   (optional), or other factors deemed appropriate based on the beneficiary's medical and social history and current clinical standards.  Advanced Directives: Does Patient Have a Medical Advance Directive?: No Would patient like information on creating a medical advance directive?: No - Patient declined    Assessment:    This is a routine wellness  examination for this patient . completed  Vision/Hearing screen Vision Screening   Right eye Left eye Both eyes  Without correction     With correction 20 13 20 13 20 13     Dietary issues and exercise activities discussed:      Goals   None     Depression Screen    02/12/2023   10:52 AM 01/11/2023    8:44 AM 10/10/2022    3:11 PM 07/09/2022    3:43 PM  PHQ 2/9 Scores  PHQ - 2 Score  0 0 0  PHQ- 9 Score    0  Exception Documentation Patient refusal        Fall Risk    02/12/2023   10:52 AM  Fall Risk   Falls in the past year? 0  Number falls in past yr: 0  Injury with Fall? 0  Risk for fall due to : No Fall Risks    Cognitive Function    02/12/2023   10:53 AM  MMSE - Mini Mental State Exam  Orientation to time 5  Orientation to Place 5  Registration 3  Attention/ Calculation 5  Recall 3  Language- name 2 objects 2  Language- repeat 1  Language- follow 3 step command 3  Language- read & follow direction 1  Write a sentence 1  Copy design 1  Total score 30        Patient Care  Team: Grayce Sessions, NP as PCP - General (Internal Medicine)     Plan:   Ysmael was seen today for medicare wellness.  Diagnoses and all orders for this visit:  Encounter for Medicare annual wellness exam  I have personally reviewed and noted the following in the patient's chart:   Medical and social history Use of alcohol, tobacco or illicit drugs  Current medications and supplements Functional ability and status Nutritional status Physical activity Advanced directives List of other physicians Hospitalizations, surgeries, and ER visits in previous 12 months  Vitals Screenings to include cognitive, depression, and falls Referrals and appointments  In addition, I have reviewed and discussed with patient certain preventive protocols, quality metrics, and best practice recommendations. A written personalized care plan for preventive services as well as general preventive health recommendations were provided to patient.    Herbert Deaner, RMA 02/12/2023

## 2023-03-19 ENCOUNTER — Encounter (HOSPITAL_COMMUNITY): Payer: Self-pay | Admitting: *Deleted

## 2023-03-19 ENCOUNTER — Ambulatory Visit (HOSPITAL_COMMUNITY)
Admission: EM | Admit: 2023-03-19 | Discharge: 2023-03-19 | Disposition: A | Discharge status: Home / Self Care | Payer: Medicare HMO | Attending: Emergency Medicine | Admitting: Emergency Medicine

## 2023-03-19 DIAGNOSIS — H5789 Other specified disorders of eye and adnexa: Secondary | ICD-10-CM

## 2023-03-19 MED ORDER — OLOPATADINE HCL 0.1 % OP SOLN: 1.0000 [drp] | Freq: Two times a day (BID) | OPHTHALMIC | 0 refills | Status: DC

## 2023-03-19 NOTE — Discharge Instructions (Signed)
Try the eye drops twice daily for the next few days. These should help with irritation Please follow up with your primary care provider

## 2023-03-19 NOTE — ED Provider Notes (Signed)
[facility]    CSN: [id number] Arrival date & time: 03/19/23  1923      History   Chief Complaint Chief Complaint  Patient presents with   Eye Irritation     HPI Roy Martinez is a 67 y.o. male.  Here with left eye itching and irritation that started yesterday He was power washing a few days ago and does not know if this is related.  He denies foreign body sensation. No drainage or discharge.  No eye pain.  No swelling. No changes in vision  Does not wear contacts  He wanted to make sure it was not anything related to a stroke  Past Medical History:  Diagnosis Date   Chronic hepatitis C virus infection (HCC)    under treatment June 2016   H/O ETOH abuse    Hypertension    Smoker    1/2 ppd    Patient Active Problem List   Diagnosis Date Noted   Smoker 03/28/2017   Irregular heart beat 03/28/2017   Abnormal EKG 03/28/2017   ETOH abuse 03/28/2017   Chronic hepatitis C without hepatic coma (HCC) 03/11/2017   Patellar tendon rupture, right, initial encounter 06/26/2016    Past Surgical History:  Procedure Laterality Date   COLONOSCOPY     KNEE ARTHROSCOPY     KNEE ARTHROSCOPY WITH MENISCAL REPAIR Right 06/26/2016   Procedure: KNEE ARTHROSCOPY WITH MENISCAL REPAIR VS RESECTION;  Surgeon: Cammy Copa, MD;  Location: [facility] OR;  Service: Orthopedics;  Laterality: Right;   PATELLAR TENDON REPAIR Right 06/26/2016   Procedure: PATELLA TENDON REPAIR;  Surgeon: Cammy Copa, MD;  Location: [facility] OR;  Service: Orthopedics;  Laterality: Right;   POLYPECTOMY         Home Medications    Prior to Admission medications   Medication Sig Start Date End Date Taking? Authorizing Provider  hydrochlorothiazide (HYDRODIURIL) 25 MG tablet TAKE 1 TABLET(25 MG) BY MOUTH DAILY 10/30/22  Yes Grayce Sessions, NP  olopatadine (PATANOL) 0.1 % ophthalmic solution Place 1 drop into the left eye 2 (two) times daily. 03/19/23  Yes Delesia Martinek, Lurena Joiner, PA-C  Cholecalciferol  (VITAMIN D3) 50 MCG (2000 UT) capsule Take 1 capsule (2,000 Units total) by mouth daily. 04/02/22   Grayce Sessions, NP  meloxicam (MOBIC) 15 MG tablet TAKE 1 TABLET(15 MG) BY MOUTH DAILY 09/11/22   Magnant, Joycie Peek, PA-C    Family History Family History  Problem Relation Age of Onset   Heart failure Mother    Heart disease Mother    Heart failure Father    Arrhythmia Brother    Colon cancer Neg Hx    Esophageal cancer Neg Hx    Rectal cancer Neg Hx    Stomach cancer Neg Hx    Colon polyps Neg Hx    Crohn's disease Neg Hx    Ulcerative colitis Neg Hx     Social History Social History   Tobacco Use   Smoking status: Every Day    Packs/day: .5    Types: Cigarettes    Passive exposure: Never   Smokeless tobacco: Never   Tobacco comments:    cutting back  Vaping Use   Vaping Use: Never used  Substance Use Topics   Alcohol use: Yes    Alcohol/week: 5.0 - 6.0 standard drinks of alcohol    Types: 5 - 6 Cans of beer per week    Comment: pt states daily beer drinker,STATES NOT MUCH UPDATED 06/28/22   Drug  use: Yes    Types: "Crack" cocaine    Comment: Quit 4 years ago     Allergies   Penicillins   Review of Systems Review of Systems As per HPI  Physical Exam Triage Vital Signs ED Triage Vitals [03/19/23 1944]  Enc Vitals Group     BP 126/80     Pulse Rate 75     Resp 18     Temp 98.5 F (36.9 C)     Temp Source Oral     SpO2 97 %     Weight      Height      Head Circumference      Peak Flow      Pain Score 6     Pain Loc      Pain Edu?      Excl. in GC?    No data found.  Updated Vital Signs BP 126/80 (BP Location: Left Arm)   Pulse 75   Temp 98.5 F (36.9 C) (Oral)   Resp 18   SpO2 97%    Physical Exam Vitals and nursing note reviewed.  Constitutional:      General: He is not in acute distress.    Appearance: Normal appearance.  HENT:     Nose: No congestion.     Mouth/Throat:     Mouth: Mucous membranes are moist.     Pharynx:  Oropharynx is clear.  Eyes:     General: Lids are normal. Lids are everted, no foreign bodies appreciated. Vision grossly intact. Gaze aligned appropriately.        Right eye: No discharge.        Left eye: No foreign body or discharge.     Extraocular Movements: Extraocular movements intact.     Left eye: Normal extraocular motion and no nystagmus.     Conjunctiva/sclera: Conjunctivae normal.     Left eye: Left conjunctiva is not injected.     Pupils: Pupils are equal, round, and reactive to light.  Cardiovascular:     Rate and Rhythm: Normal rate and regular rhythm.     Heart sounds: Normal heart sounds.  Pulmonary:     Effort: Pulmonary effort is normal.     Breath sounds: Normal breath sounds.  Musculoskeletal:     Cervical back: Normal range of motion.  Skin:    General: Skin is warm and dry.  Neurological:     Mental Status: He is alert and oriented to person, place, and time.     UC Treatments / Results  Labs (all labs ordered are listed, but only abnormal results are displayed) Labs Reviewed - No data to display  EKG   Radiology No results found.  Procedures Procedures (including critical care time)  Medications Ordered in UC Medications - No data to display  Initial Impression / Assessment and Plan / UC Course  I have reviewed the triage vital signs and the nursing notes.  Pertinent labs & imaging results that were available during my care of the patient were reviewed by me and considered in my medical decision making (see chart for details).  Itching and irritation of left eye No sign of foreign body. Does not have any pain or vision changes. Rinsed out eye with saline and patient feeling a little better. Will try olopatadine drops for comfort. Discussed I have no concern for stroke, discussed what those symptoms would be. Advised to wear goggles when working. Follow with primary care. Patient agrees to plan, all questions  answered  Final Clinical  Impressions(s) / UC Diagnoses   Final diagnoses:  Eye irritation     Discharge Instructions      Try the eye drops twice daily for the next few days. These should help with irritation Please follow up with your primary care provider     ED Prescriptions     Medication Sig Dispense Auth. Provider   olopatadine (PATANOL) 0.1 % ophthalmic solution Place 1 drop into the left eye 2 (two) times daily. 5 mL Shirrell Solinger, Lurena Joiner, PA-C      PDMP not reviewed this encounter.   Mcdaniel Ohms, Lurena Joiner, [place] 03/19/23 2015

## 2023-03-19 NOTE — ED Triage Notes (Signed)
Pt states he was pressure washing a couple of days ago and now he is having left eye pain.

## 2023-06-18 ENCOUNTER — Encounter (HOSPITAL_COMMUNITY): Payer: Self-pay

## 2023-06-18 ENCOUNTER — Ambulatory Visit (INDEPENDENT_AMBULATORY_CARE_PROVIDER_SITE_OTHER): Payer: Medicare HMO

## 2023-06-18 ENCOUNTER — Ambulatory Visit (HOSPITAL_COMMUNITY)
Admission: EM | Admit: 2023-06-18 | Discharge: 2023-06-18 | Disposition: A | Discharge status: Home / Self Care | Payer: Medicare HMO | Attending: Family Medicine | Admitting: Family Medicine

## 2023-06-18 DIAGNOSIS — M25521 Pain in right elbow: Secondary | ICD-10-CM | POA: Diagnosis not present

## 2023-06-18 DIAGNOSIS — M25511 Pain in right shoulder: Secondary | ICD-10-CM

## 2023-06-18 DIAGNOSIS — M79631 Pain in right forearm: Secondary | ICD-10-CM | POA: Diagnosis not present

## 2023-06-18 DIAGNOSIS — M79601 Pain in right arm: Secondary | ICD-10-CM

## 2023-06-18 DIAGNOSIS — Z043 Encounter for examination and observation following other accident: Secondary | ICD-10-CM | POA: Diagnosis not present

## 2023-06-18 DIAGNOSIS — M19021 Primary osteoarthritis, right elbow: Secondary | ICD-10-CM | POA: Diagnosis not present

## 2023-06-18 DIAGNOSIS — S59001A Unspecified physeal fracture of lower end of ulna, right arm, initial encounter for closed fracture: Secondary | ICD-10-CM | POA: Diagnosis not present

## 2023-06-18 DIAGNOSIS — M19011 Primary osteoarthritis, right shoulder: Secondary | ICD-10-CM | POA: Diagnosis not present

## 2023-06-18 MED ORDER — IBUPROFEN 800 MG PO TABS: 800.0000 mg | ORAL_TABLET | Freq: Three times a day (TID) | ORAL | 0 refills | Status: AC | PRN

## 2023-06-18 NOTE — Discharge Instructions (Addendum)
I do not see any abnormalities that are consistent with an acute fracture, but there is 1 area that looks like possibly an old injury near the bones in your elbow.  The radiologist will also read your x-ray, and if their interpretation differs significantly from mine, we will call you.  Wear the sling provided  Take ibuprofen 800 mg--1 tab every 8 hours as needed for pain.  If you still have meloxicam at home for pain, do not take that while you are taking the ibuprofen.

## 2023-06-18 NOTE — ED Provider Notes (Addendum)
[facility]    CSN: [id number] Arrival date & time: 06/18/23  1412      History   Chief Complaint Chief Complaint  Patient presents with   Fall    HPI Roy Martinez is a 67 y.o. male.    Fall  Here for pain in his right arm. He states that yesterday he lost his balance and fell onto his right side.  He states his whole right arm hurts.  On closer questioning I cannot get him to localize his pain any further than stating that his right upper arm hurts down to his right forearm. He has trouble straightening his right elbow, and he states his right shoulder is sore     Past Medical History:  Diagnosis Date   Chronic hepatitis C virus infection (HCC)    under treatment June 2016   H/O ETOH abuse    Hypertension    Smoker    1/2 ppd    Patient Active Problem List   Diagnosis Date Noted   Smoker 03/28/2017   Irregular heart beat 03/28/2017   Abnormal EKG 03/28/2017   ETOH abuse 03/28/2017   Chronic hepatitis C without hepatic coma (HCC) 03/11/2017   Patellar tendon rupture, right, initial encounter 06/26/2016    Past Surgical History:  Procedure Laterality Date   COLONOSCOPY     KNEE ARTHROSCOPY     KNEE ARTHROSCOPY WITH MENISCAL REPAIR Right 06/26/2016   Procedure: KNEE ARTHROSCOPY WITH MENISCAL REPAIR VS RESECTION;  Surgeon: Cammy Copa, MD;  Location: [facility] OR;  Service: Orthopedics;  Laterality: Right;   PATELLAR TENDON REPAIR Right 06/26/2016   Procedure: PATELLA TENDON REPAIR;  Surgeon: Cammy Copa, MD;  Location: [facility] OR;  Service: Orthopedics;  Laterality: Right;   POLYPECTOMY         Home Medications    Prior to Admission medications   Medication Sig Start Date End Date Taking? Authorizing Provider  ibuprofen (ADVIL) 800 MG tablet Take 1 tablet (800 mg total) by mouth every 8 (eight) hours as needed for up to 7 days (pain). 06/18/23 06/25/23 Yes Zenia Resides, MD  Cholecalciferol (VITAMIN D3) 50 MCG (2000 UT) capsule Take  1 capsule (2,000 Units total) by mouth daily. 04/02/22   Grayce Sessions, NP  hydrochlorothiazide (HYDRODIURIL) 25 MG tablet TAKE 1 TABLET(25 MG) BY MOUTH DAILY 10/30/22   Grayce Sessions, NP  meloxicam (MOBIC) 15 MG tablet TAKE 1 TABLET(15 MG) BY MOUTH DAILY 09/11/22   Magnant, Charles L, PA-C  olopatadine (PATANOL) 0.1 % ophthalmic solution Place 1 drop into the left eye 2 (two) times daily. 03/19/23   Rising, Lurena Joiner, PA-C    Family History Family History  Problem Relation Age of Onset   Heart failure Mother    Heart disease Mother    Heart failure Father    Arrhythmia Brother    Colon cancer Neg Hx    Esophageal cancer Neg Hx    Rectal cancer Neg Hx    Stomach cancer Neg Hx    Colon polyps Neg Hx    Crohn's disease Neg Hx    Ulcerative colitis Neg Hx     Social History Social History   Tobacco Use   Smoking status: Every Day    Current packs/day: 0.50    Types: Cigarettes    Passive exposure: Never   Smokeless tobacco: Never   Tobacco comments:    cutting back  Vaping Use   Vaping status: Never Used  Substance Use Topics  Alcohol use: Yes    Alcohol/week: 5.0 - 6.0 standard drinks of alcohol    Types: 5 - 6 Cans of beer per week    Comment: pt states daily beer drinker,STATES NOT MUCH UPDATED 06/28/22   Drug use: Not Currently    Types: "Crack" cocaine    Comment: years ago     Allergies   Penicillins   Review of Systems Review of Systems   Physical Exam Triage Vital Signs ED Triage Vitals  Encounter Vitals Group     BP 06/18/23 1507 130/79     Systolic BP Percentile --      Diastolic BP Percentile --      Pulse Rate 06/18/23 1507 (!) 54     Resp 06/18/23 1507 16     Temp 06/18/23 1507 98.4 F (36.9 C)     Temp Source 06/18/23 1507 Oral     SpO2 06/18/23 1507 94 %     Weight --      Height 06/18/23 1507 6\' 1"  (1.854 m)     Head Circumference --      Peak Flow --      Pain Score 06/18/23 1505 8     Pain Loc --      Pain Education --       Exclude from Growth Chart --    No data found.  Updated Vital Signs BP 130/79 (BP Location: Right Arm)   Pulse (!) 54   Temp 98.4 F (36.9 C) (Oral)   Resp 16   Ht 6\' 1"  (1.854 m)   SpO2 94%   BMI 22.35 kg/m   Visual Acuity Right Eye Distance:   Left Eye Distance:   Bilateral Distance:    Right Eye Near:   Left Eye Near:    Bilateral Near:     Physical Exam Vitals reviewed.  Constitutional:      General: He is not in acute distress.    Appearance: He is not ill-appearing, toxic-appearing or diaphoretic.  Musculoskeletal:     Comments: He holds his right humerus close to his body.  There is no palpable abnormality or deformity at the shoulder otherwise.  He also cannot extend his right elbow fully.  There is no obvious swelling except for maybe a little bit along his right forearm on the dorsal side in the middle of the forearm.  No abrasion  Skin:    Coloration: Skin is not jaundiced or pale.  Neurological:     General: No focal deficit present.     Mental Status: He is alert and oriented to person, place, and time.     Comments: He is able to flex and extend his fingers on his right hand without any problem      UC Treatments / Results  Labs (all labs ordered are listed, but only abnormal results are displayed) Labs Reviewed - No data to display  EKG   Radiology No results found.  Procedures Procedures (including critical care time)  Medications Ordered in UC Medications - No data to display  Initial Impression / Assessment and Plan / UC Course  I have reviewed the triage vital signs and the nursing notes.  Pertinent labs & imaging results that were available during my care of the patient were reviewed by me and considered in my medical decision making (see chart for details).        By my review I do not see any acute fractures or bony abnormalities.  Near the radial  head I see a tiny rounded piece of bone that does not appear to be an acute  fracture, but it is most likely due to an old injury.  There are some degenerative changes and also some calcification in the tendon coming off the olecranon process.  Patient is advised of radiology overread.  Ibuprofen is sent in for pain and sling is provided today.  He is still given contact information for orthopedics Final Clinical Impressions(s) / UC Diagnoses   Final diagnoses:  Right arm pain  Acute pain of right shoulder     Discharge Instructions      I do not see any abnormalities that are consistent with an acute fracture, but there is 1 area that looks like possibly an old injury near the bones in your elbow.  The radiologist will also read your x-ray, and if their interpretation differs significantly from mine, we will call you.  Wear the sling provided  Take ibuprofen 800 mg--1 tab every 8 hours as needed for pain.  If you still have meloxicam at home for pain, do not take that while you are taking the ibuprofen.      ED Prescriptions     Medication Sig Dispense Auth. Provider   ibuprofen (ADVIL) 800 MG tablet Take 1 tablet (800 mg total) by mouth every 8 (eight) hours as needed for up to 7 days (pain). 21 tablet Flois Mctague, Janace Aris, MD      PDMP not reviewed this encounter.   Zenia Resides, MD 06/18/23 1609    Zenia Resides, MD 06/18/23 [phone]

## 2023-06-18 NOTE — ED Triage Notes (Signed)
Patient here today with c/o right arm pain after falling yesterday. Patient states that his entire right arm hurts and has increased pain with ROM. Patient was walking outside his house and lost his balance and fell onto right arm.

## 2023-06-24 ENCOUNTER — Other Ambulatory Visit (INDEPENDENT_AMBULATORY_CARE_PROVIDER_SITE_OTHER): Payer: Self-pay | Admitting: Primary Care

## 2023-06-24 DIAGNOSIS — I10 Essential (primary) hypertension: Secondary | ICD-10-CM

## 2023-06-24 DIAGNOSIS — Z760 Encounter for issue of repeat prescription: Secondary | ICD-10-CM

## 2023-08-12 ENCOUNTER — Encounter: Payer: Self-pay | Admitting: Gastroenterology

## 2023-08-15 ENCOUNTER — Telehealth (INDEPENDENT_AMBULATORY_CARE_PROVIDER_SITE_OTHER): Payer: Self-pay | Admitting: Primary Care

## 2023-08-15 NOTE — Telephone Encounter (Signed)
Called to confirm apt with pt. He will be present

## 2023-08-16 ENCOUNTER — Ambulatory Visit (INDEPENDENT_AMBULATORY_CARE_PROVIDER_SITE_OTHER): Payer: Medicare HMO | Admitting: Primary Care

## 2023-08-16 ENCOUNTER — Telehealth (INDEPENDENT_AMBULATORY_CARE_PROVIDER_SITE_OTHER): Payer: Self-pay | Admitting: Primary Care

## 2023-08-16 NOTE — Telephone Encounter (Signed)
Called to inform pt about upcoming apt. He will be at [facility] for apt.

## 2023-08-21 ENCOUNTER — Encounter (INDEPENDENT_AMBULATORY_CARE_PROVIDER_SITE_OTHER): Payer: Self-pay | Admitting: Primary Care

## 2023-08-21 ENCOUNTER — Ambulatory Visit (INDEPENDENT_AMBULATORY_CARE_PROVIDER_SITE_OTHER): Payer: Medicare HMO | Admitting: Primary Care

## 2023-08-21 VITALS — BP 129/85 | HR 64 | Resp 16 | Wt 171.8 lb

## 2023-08-21 DIAGNOSIS — Z760 Encounter for issue of repeat prescription: Secondary | ICD-10-CM | POA: Diagnosis not present

## 2023-08-21 DIAGNOSIS — Z1211 Encounter for screening for malignant neoplasm of colon: Secondary | ICD-10-CM | POA: Diagnosis not present

## 2023-08-21 DIAGNOSIS — Z23 Encounter for immunization: Secondary | ICD-10-CM | POA: Diagnosis not present

## 2023-08-21 DIAGNOSIS — I10 Essential (primary) hypertension: Secondary | ICD-10-CM | POA: Diagnosis not present

## 2023-08-21 MED ORDER — HYDROCHLOROTHIAZIDE 25 MG PO TABS: 25.0000 mg | ORAL_TABLET | Freq: Every day | ORAL | 1 refills | Status: DC

## 2023-08-21 NOTE — Progress Notes (Signed)
[facility]  Roy Martinez, is a 67 y.o. male  FAO:[id number]  ONG:[id number]  DOB - August 09, 1956  Chief Complaint  Patient presents with   Hypertension       Subjective:   Roy Martinez is a 67 y.o. male here today for a follow up visit HTN. Patient has No headache, No chest pain, No abdominal pain - No Nausea, No new weakness tingling or numbness, No Cough - shortness of breath. C/O having problems with not wanting to eat to eating too much. Enjoying sleeping all the time lost of energy denies depressions. Denies thought to harm selt but to others that owe him money and chuckles.   No problems updated.  Allergies  Allergen Reactions   Penicillins Hives    Childhood allergy Has patient had a PCN reaction causing immediate rash, facial/tongue/throat swelling, SOB or lightheadedness with hypotension: No Has patient had a PCN reaction causing severe rash involving mucus membranes or skin necrosis: No Has patient had a PCN reaction that required hospitalization No Has patient had a PCN reaction occurring within the last 10 years: No If all of the above answers are "NO", then may proceed with Cephalosporin use.     Past Medical History:  Diagnosis Date   Chronic hepatitis C virus infection (HCC)    under treatment June 2016   H/O ETOH abuse    Hypertension    Smoker    1/2 ppd    Current Outpatient Medications on File Prior to Visit  Medication Sig Dispense Refill   Cholecalciferol (VITAMIN D3) 50 MCG (2000 UT) capsule Take 1 capsule (2,000 Units total) by mouth daily. 90 capsule 1   hydrochlorothiazide (HYDRODIURIL) 25 MG tablet TAKE 1 TABLET(25 MG) BY MOUTH DAILY 90 tablet 1   meloxicam (MOBIC) 15 MG tablet TAKE 1 TABLET(15 MG) BY MOUTH DAILY 90 tablet 0   olopatadine (PATANOL) 0.1 % ophthalmic solution Place 1 drop into the left eye 2 (two) times daily. 5 mL 0   No current facility-administered medications on file prior to visit.    Objective:    Vitals:   08/21/23 1507  BP: 129/85  Pulse: 64  Resp: 16  SpO2: 99%  Weight: 171 lb 12.8 oz (77.9 kg)    Comprehensive ROS Pertinent positive and negative noted in HPI   Exam General appearance : Awake, alert, not in any distress. Speech Clear. Not toxic looking HEENT: Atraumatic and Normocephalic, pupils equally reactive to light and accomodation Neck: Supple, no JVD. No cervical lymphadenopathy.  Chest: Good air entry bilaterally, no added sounds  CVS: S1 S2 regular, no murmurs.  Abdomen: Bowel sounds present, Non tender and not distended with no gaurding, rigidity or rebound. Extremities: B/L Lower Ext shows no edema, both legs are warm to touch Neurology: Awake alert, and oriented X 3, CN II-XII intact, Non focal Skin: No Rash  Data Review Lab Results  Component Value Date   HGBA1C 5.5 01/11/2023   HGBA1C 5.2 12/25/2021    Assessment & Plan  Roy Martinez was seen today for hypertension.  Diagnoses and all orders for this visit:  Essential hypertension Well controlled on hydrochlorothiazide 25mg   BP goal - < 130/80 Explained that having normal blood pressure is the goal and medications are helping to get to goal and maintain normal blood pressure. DIET: Limit salt intake, read nutrition labels to check salt content, limit fried and high fatty foods  Avoid using multisymptom OTC cold preparations that generally contain sudafed which can rise BP. Consult with  pharmacist on best cold relief products to use for persons with HTN EXERCISE Discussed incorporating exercise such as walking - 30 minutes most days of the week and can do in 10 minute intervals     Encounter for immunization -     Flu Vaccine Trivalent High Dose (Fluad)  Colon cancer screening -     Ambulatory referral to Gastroenterology     Patient have been counseled extensively about nutrition and exercise. Other issues discussed during this visit include: low cholesterol diet, weight control and daily  exercise, foot care, annual eye examinations at Ophthalmology, importance of adherence with medications and regular follow-up. We also discussed long term complications of uncontrolled diabetes and hypertension.   Return in about 3 months (around 11/21/2023) for medical conditions.  The patient was given clear instructions to go to ER or return to medical center if symptoms don't improve, worsen or new problems develop. The patient verbalized understanding. The patient was told to call to get lab results if they haven't heard anything in the next week.   This note has been created with Education officer, environmental. Any transcriptional errors are unintentional.   Grayce Sessions, NP 08/21/2023, 3:28 PM

## 2023-08-21 NOTE — Patient Instructions (Signed)

## 2023-08-26 ENCOUNTER — Telehealth (INDEPENDENT_AMBULATORY_CARE_PROVIDER_SITE_OTHER): Payer: Self-pay | Admitting: Primary Care

## 2023-08-26 NOTE — Telephone Encounter (Signed)
I called Mr Roy Martinez to get him scheduled for a 3 mth F/U. VM left with pt.

## 2023-10-23 ENCOUNTER — Encounter: Payer: Self-pay | Admitting: Gastroenterology

## 2023-11-21 ENCOUNTER — Telehealth: Payer: Self-pay

## 2023-11-21 NOTE — Telephone Encounter (Signed)
 Left message for patient to call back and reschedule pre visit by end of day or his colonoscopy on 12/19/23 would be cancelled.

## 2023-11-21 NOTE — Telephone Encounter (Signed)
 Called for pre visit appointment.  No answer, left message letting the patient know that I was trying to reach him.

## 2023-12-19 ENCOUNTER — Encounter: Payer: Medicare HMO | Admitting: Gastroenterology

## 2024-06-02 ENCOUNTER — Encounter (HOSPITAL_COMMUNITY): Payer: Self-pay | Admitting: *Deleted

## 2024-06-02 ENCOUNTER — Ambulatory Visit (HOSPITAL_COMMUNITY)
Admission: EM | Admit: 2024-06-02 | Discharge: 2024-06-02 | Disposition: A | Discharge status: Home / Self Care | Attending: Emergency Medicine | Admitting: Emergency Medicine

## 2024-06-02 DIAGNOSIS — M25511 Pain in right shoulder: Secondary | ICD-10-CM

## 2024-06-02 DIAGNOSIS — S46811A Strain of other muscles, fascia and tendons at shoulder and upper arm level, right arm, initial encounter: Secondary | ICD-10-CM | POA: Diagnosis not present

## 2024-06-02 MED ORDER — BACLOFEN 10 MG PO TABS: 10.0000 mg | ORAL_TABLET | Freq: Three times a day (TID) | ORAL | 0 refills | Status: DC

## 2024-06-02 NOTE — ED Provider Notes (Signed)
 [facility]    CSN: [id number] Arrival date & time: 06/02/24  1932      History   Chief Complaint Chief Complaint  Patient presents with   Back Pain    HPI Roy Martinez is a 68 y.o. male.   Patient presents with right upper back pain and right shoulder pain that began 2 days ago.  Patient states the pain is worse with movement.  Patient denies any recent falls or known injuries.  Patient states that he recently got a new bed and and shortly after began having this pain.  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  Patient states that he did take some ibuprofen  this morning with some relief.  The history is provided by the patient and medical records.  Back Pain   Past Medical History:  Diagnosis Date   Chronic hepatitis C virus infection (HCC)    under treatment June 2016   H/O ETOH abuse    Hypertension    Smoker    1/2 ppd    Patient Active Problem List   Diagnosis Date Noted   Smoker 03/28/2017   Irregular heart beat 03/28/2017   Abnormal EKG 03/28/2017   ETOH abuse 03/28/2017   Chronic hepatitis C without hepatic coma (HCC) 03/11/2017   Patellar tendon rupture, right, initial encounter 06/26/2016    Past Surgical History:  Procedure Laterality Date   COLONOSCOPY     KNEE ARTHROSCOPY     KNEE ARTHROSCOPY WITH MENISCAL REPAIR Right 06/26/2016   Procedure: KNEE ARTHROSCOPY WITH MENISCAL REPAIR VS RESECTION;  Surgeon: Glendia Cordella Hutchinson, MD;  Location: [facility] OR;  Service: Orthopedics;  Laterality: Right;   PATELLAR TENDON REPAIR Right 06/26/2016   Procedure: PATELLA TENDON REPAIR;  Surgeon: Glendia Cordella Hutchinson, MD;  Location: [facility] OR;  Service: Orthopedics;  Laterality: Right;   POLYPECTOMY         Home Medications    Prior to Admission medications   Medication Sig Start Date End Date Taking? Authorizing Provider  baclofen  (LIORESAL ) 10 MG tablet Take 1  tablet (10 mg total) by mouth 3 (three) times daily. 06/02/24  Yes Johnie, Michaila Kenney A, NP  hydrochlorothiazide  (HYDRODIURIL ) 25 MG tablet Take 1 tablet (25 mg total) by mouth daily. 08/21/23  Yes Celestia Rosaline SQUIBB, NP  Cholecalciferol (VITAMIN D3) 50 MCG (2000 UT) capsule Take 1 capsule (2,000 Units total) by mouth daily. 04/02/22   Celestia Rosaline SQUIBB, NP  meloxicam  (MOBIC ) 15 MG tablet TAKE 1 TABLET(15 MG) BY MOUTH DAILY 09/11/22   Magnant, Carlin CROME, PA-C  olopatadine  (PATANOL) 0.1 % ophthalmic solution Place 1 drop into the left eye 2 (two) times daily. 03/19/23   Rising, [place], PA-C    Family History Family History  Problem Relation Age of Onset   Heart failure Mother    Heart disease Mother    Heart failure Father    Arrhythmia Brother    Colon cancer Neg Hx    Esophageal cancer Neg Hx    Rectal cancer Neg Hx    Stomach cancer Neg Hx    Colon polyps Neg Hx    Crohn's disease Neg Hx    Ulcerative colitis Neg Hx     Social History Social History   Tobacco Use   Smoking status: Every Day    Current packs/day: 0.50    Types: Cigarettes    Passive exposure: Never  Smokeless tobacco: Never   Tobacco comments:    cutting back  Vaping Use   Vaping status: Never Used  Substance Use Topics   Alcohol use: Yes    Alcohol/week: 5.0 - 6.0 standard drinks of alcohol    Types: 5 - 6 Cans of beer per week    Comment: pt states daily beer drinker   Drug use: Not Currently    Types: Crack cocaine    Comment: years ago     Allergies   Penicillins   Review of Systems Review of Systems  Musculoskeletal:  Positive for back pain.   Per HPI  Physical Exam Triage Vital Signs ED Triage Vitals  Encounter Vitals Group     BP 06/02/24 2004 (!) 140/80     Girls Systolic BP Percentile --      Girls Diastolic BP Percentile --      Boys Systolic BP Percentile --      Boys Diastolic BP Percentile --      Pulse Rate 06/02/24 2004 (!) 53     Resp 06/02/24 2004 16     Temp  06/02/24 2004 98.1 F (36.7 C)     Temp Source 06/02/24 2004 Oral     SpO2 06/02/24 2004 95 %     Weight --      Height --      Head Circumference --      Peak Flow --      Pain Score 06/02/24 1957 8     Pain Loc --      Pain Education --      Exclude from Growth Chart --    No data found.  Updated Vital Signs BP (!) 140/80 (BP Location: Left Arm)   Pulse (!) 53   Temp 98.1 F (36.7 C) (Oral)   Resp 16   SpO2 95%   Visual Acuity Right Eye Distance:   Left Eye Distance:   Bilateral Distance:    Right Eye Near:   Left Eye Near:    Bilateral Near:     Physical Exam Vitals and nursing note reviewed.  Constitutional:      General: He is awake. He is not in acute distress.    Appearance: Normal appearance. He is well-developed and well-groomed. He is not ill-appearing.  Musculoskeletal:     Right shoulder: Normal. No swelling, deformity, tenderness or bony tenderness. Normal range of motion.     Cervical back: Normal.     Thoracic back: Tenderness present. No swelling, edema, deformity or bony tenderness. Normal range of motion.     Lumbar back: Normal.       Back:     Comments: Mild tenderness noted to right trapezius muscle.  Skin:    General: Skin is warm and dry.  Neurological:     Mental Status: He is alert.  Psychiatric:        Behavior: Behavior is cooperative.      UC Treatments / Results  Labs (all labs ordered are listed, but only abnormal results are displayed) Labs Reviewed - No data to display  EKG   Radiology No results found.  Procedures Procedures (including critical care time)  Medications Ordered in UC Medications - No data to display  Initial Impression / Assessment and Plan / UC Course  I have reviewed the triage vital signs and the nursing notes.  Pertinent labs & imaging results that were available during my care of the patient were reviewed by me and considered in  my medical decision making (see chart for details).      Patient is overall well-appearing.  Vitals are stable.  Symptoms likely muscular in nature.  Prescribed baclofen  as needed for muscle pain and spasms.  Recommended Tylenol  as needed for pain.  Given orthopedic follow-up.  Discussed follow-up and return precautions. Final Clinical Impressions(s) / UC Diagnoses   Final diagnoses:  Trapezius muscle strain, right, initial encounter  Acute pain of right shoulder     Discharge Instructions      You can take baclofen  every 8 hours as needed for muscle pain and spasms. You can also take 500 mg of Tylenol  every 6-8 hours as needed for pain. Alternate between ice and heat as needed for pain and do some gentle stretching. You can follow-up with [facility] if your pain continues Otherwise follow-up with your primary care provider or return here as needed.    ED Prescriptions     Medication Sig Dispense Auth. Provider   baclofen  (LIORESAL ) 10 MG tablet Take 1 tablet (10 mg total) by mouth 3 (three) times daily. 30 each Johnie Rumaldo LABOR, NP      PDMP not reviewed this encounter.   Johnie Rumaldo A, NP 06/02/24 2029

## 2024-06-02 NOTE — Discharge Instructions (Addendum)
 You can take baclofen  every 8 hours as needed for muscle pain and spasms. You can also take 500 mg of Tylenol  every 6-8 hours as needed for pain. Alternate between ice and heat as needed for pain and do some gentle stretching. You can follow-up with [place] [facility] if your pain continues Otherwise follow-up with your primary care provider or return here as needed.

## 2024-06-02 NOTE — ED Triage Notes (Signed)
 Pt states he has back pain mostly on the right side X 2 days. He states he got a new bed and now his back hurts. He took some IBU this morning. He also does mobile car washing.

## 2024-06-04 ENCOUNTER — Encounter (HOSPITAL_COMMUNITY): Payer: Self-pay | Admitting: Emergency Medicine

## 2024-06-04 ENCOUNTER — Emergency Department (HOSPITAL_COMMUNITY)
Admission: EM | Admit: 2024-06-04 | Discharge: 2024-06-04 | Disposition: A | Discharge status: Home / Self Care | Attending: Emergency Medicine | Admitting: Emergency Medicine

## 2024-06-04 ENCOUNTER — Emergency Department (HOSPITAL_COMMUNITY)

## 2024-06-04 ENCOUNTER — Other Ambulatory Visit: Payer: Self-pay

## 2024-06-04 DIAGNOSIS — F17200 Nicotine dependence, unspecified, uncomplicated: Secondary | ICD-10-CM | POA: Insufficient documentation

## 2024-06-04 DIAGNOSIS — K869 Disease of pancreas, unspecified: Secondary | ICD-10-CM | POA: Diagnosis not present

## 2024-06-04 DIAGNOSIS — R1011 Right upper quadrant pain: Secondary | ICD-10-CM | POA: Diagnosis not present

## 2024-06-04 DIAGNOSIS — M549 Dorsalgia, unspecified: Secondary | ICD-10-CM | POA: Diagnosis not present

## 2024-06-04 DIAGNOSIS — K8689 Other specified diseases of pancreas: Secondary | ICD-10-CM

## 2024-06-04 DIAGNOSIS — K7689 Other specified diseases of liver: Secondary | ICD-10-CM | POA: Diagnosis not present

## 2024-06-04 DIAGNOSIS — M546 Pain in thoracic spine: Secondary | ICD-10-CM | POA: Diagnosis not present

## 2024-06-04 DIAGNOSIS — R935 Abnormal findings on diagnostic imaging of other abdominal regions, including retroperitoneum: Secondary | ICD-10-CM | POA: Diagnosis not present

## 2024-06-04 DIAGNOSIS — N281 Cyst of kidney, acquired: Secondary | ICD-10-CM | POA: Insufficient documentation

## 2024-06-04 DIAGNOSIS — I700 Atherosclerosis of aorta: Secondary | ICD-10-CM | POA: Diagnosis not present

## 2024-06-04 DIAGNOSIS — E876 Hypokalemia: Secondary | ICD-10-CM | POA: Diagnosis not present

## 2024-06-04 DIAGNOSIS — K8020 Calculus of gallbladder without cholecystitis without obstruction: Secondary | ICD-10-CM | POA: Diagnosis not present

## 2024-06-04 LAB — BASIC METABOLIC PANEL WITH GFR
Anion gap: 9 (ref 5–15)
BUN: 11 mg/dL (ref 8–23)
CO2: 26 mmol/L (ref 22–32)
Calcium: 8.8 mg/dL — ABNORMAL LOW (ref 8.9–10.3)
Chloride: 98 mmol/L (ref 98–111)
Creatinine, Ser: 0.64 mg/dL (ref 0.61–1.24)
GFR, Estimated: >60 mL/min (ref >60)
Glucose, Bld: 109 mg/dL — ABNORMAL HIGH (ref 70–99)
Potassium: 3.1 mmol/L — ABNORMAL LOW (ref 3.5–5.1)
Sodium: 133 mmol/L — ABNORMAL LOW (ref 135–145)

## 2024-06-04 LAB — CBC
HCT: 37.9 % — ABNORMAL LOW (ref 39.0–52.0)
Hemoglobin: 12.3 g/dL — ABNORMAL LOW (ref 13.0–17.0)
MCH: 30.5 pg (ref 26.0–34.0)
MCHC: 32.5 g/dL (ref 30.0–36.0)
MCV: 94 fL (ref 80.0–100.0)
Platelets: 340 K/uL (ref 150–400)
RBC: 4.03 MIL/uL — ABNORMAL LOW (ref 4.22–5.81)
RDW: 12.7 % (ref 11.5–15.5)
WBC: 7.3 K/uL (ref 4.0–10.5)
nRBC: 0 % (ref 0.0–0.2)

## 2024-06-04 LAB — I-STAT CHEM 8, ED
BUN: 13 mg/dL (ref 8–23)
Calcium, Ion: 1.1 mmol/L — ABNORMAL LOW (ref 1.15–1.40)
Chloride: 97 mmol/L — ABNORMAL LOW (ref 98–111)
Creatinine, Ser: 0.6 mg/dL — ABNORMAL LOW (ref 0.61–1.24)
Glucose, Bld: 111 mg/dL — ABNORMAL HIGH (ref 70–99)
HCT: 38 % — ABNORMAL LOW (ref 39.0–52.0)
Hemoglobin: 12.9 g/dL — ABNORMAL LOW (ref 13.0–17.0)
Potassium: 3.1 mmol/L — ABNORMAL LOW (ref 3.5–5.1)
Sodium: 135 mmol/L (ref 135–145)
TCO2: 26 mmol/L (ref 22–32)

## 2024-06-04 LAB — TROPONIN I (HIGH SENSITIVITY): Troponin I (High Sensitivity): 10 ng/L (ref <18)

## 2024-06-04 MED ORDER — POTASSIUM CHLORIDE CRYS ER 20 MEQ PO TBCR
40.0000 meq | EXTENDED_RELEASE_TABLET | Freq: Once | ORAL | Status: AC
  Administered 2024-06-04: 40 meq via ORAL
  Filled 2024-06-04: qty 2

## 2024-06-04 MED ORDER — DEXAMETHASONE SODIUM PHOSPHATE 10 MG/ML IJ SOLN
10.0000 mg | Freq: Once | INTRAMUSCULAR | Status: AC
  Administered 2024-06-04: 10 mg via INTRAVENOUS
  Filled 2024-06-04: qty 1

## 2024-06-04 MED ORDER — OXYCODONE-ACETAMINOPHEN 5-325 MG PO TABS
1.0000 | ORAL_TABLET | ORAL | Status: AC | PRN
  Administered 2024-06-04 (×2): 1 via ORAL
  Filled 2024-06-04 (×2): qty 1

## 2024-06-04 MED ORDER — POTASSIUM CHLORIDE ER 10 MEQ PO TBCR: 10.0000 meq | EXTENDED_RELEASE_TABLET | Freq: Every day | ORAL | 0 refills | Status: DC

## 2024-06-04 MED ORDER — METHYLPREDNISOLONE 4 MG PO TBPK: ORAL_TABLET | ORAL | 0 refills | Status: DC

## 2024-06-04 MED ORDER — HYDROMORPHONE HCL 1 MG/ML IJ SOLN
0.5000 mg | Freq: Once | INTRAMUSCULAR | Status: AC
  Administered 2024-06-04: 0.5 mg via INTRAVENOUS
  Filled 2024-06-04: qty 1

## 2024-06-04 MED ORDER — SODIUM CHLORIDE 0.9 % IV BOLUS
500.0000 mL | Freq: Once | INTRAVENOUS | Status: AC
  Administered 2024-06-04: 500 mL via INTRAVENOUS

## 2024-06-04 MED ORDER — IOHEXOL 350 MG/ML SOLN
75.0000 mL | Freq: Once | INTRAVENOUS | Status: AC | PRN
  Administered 2024-06-04: 75 mL via INTRAVENOUS

## 2024-06-04 MED ORDER — OXYCODONE-ACETAMINOPHEN 5-325 MG PO TABS
1.0000 | ORAL_TABLET | Freq: Three times a day (TID) | ORAL | 0 refills | Status: DC | PRN
Start: 2024-06-04 — End: 2024-06-18

## 2024-06-04 NOTE — Discharge Instructions (Addendum)
 You have some incidental findings in your workup today that need follow-up attention.  Please contact your primary care provider to arrange for follow-up appointment.  They should be able to direct your further care.  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  You also have a cyst on your liver.  The pancreas mass is recommended to have an MRI with contrast as an outpatient.  Your primary care provider office can help you arrange this.  Your potassium level was also mildly low today and we prescribed you potassium pills.  I started you on steroids and offered a few painkillers to take for your back pain.  Please do not mix the Percocet painkiller with baclofen  or any type of muscle relaxer, alcohol, or sedating medicine.  If your pain is getting worse, or if you are feeling lightheaded, difficulty breathing, loss of consciousness, or any other emergency concerns, please call 911 or return to the ER.

## 2024-06-04 NOTE — ED Triage Notes (Signed)
 Pt reports back pain that started 2 days ago, was seen at [facility] and was prescribed meds with no relief, last dose this morning around 4am. Denies any recent falls or trauma.

## 2024-06-04 NOTE — ED Provider Notes (Signed)
 [place] EMERGENCY DEPARTMENT AT [facility] Provider Note   CSN: [id number] Arrival date & time: 06/04/24  9396     Patient presents with: Back Pain   Roy Martinez is a 68 y.o. male presented to ED complaining of back pain.  Patient reports onset of symptoms about 2 days ago.  He says he had gradual onset pain that he feels most in between his shoulder blades rating towards his shoulders.  Not radiating down into his hands.  Denies chest pressure.  He says the pain has been constant.  He is not getting any relief.  He went to urgent care 2 days ago and was prescribed muscle relaxers which she said did not do anything for his pain.  He does feel that the pain is positional and worse with sitting up and getting out of bed.  He denies any history of spinal surgery.  He does report smoking history.  Denies any known history of aneurysm in the family.  He takes 1 blood pressure medicine at baseline.  {Add pertinent medical, surgical, social history, OB history to HPI:32947} HPI     Prior to Admission medications   Medication Sig Start Date End Date Taking? Authorizing Provider  baclofen  (LIORESAL ) 10 MG tablet Take 1 tablet (10 mg total) by mouth 3 (three) times daily. 06/02/24   Johnie Flaming A, NP  Cholecalciferol (VITAMIN D3) 50 MCG (2000 UT) capsule Take 1 capsule (2,000 Units total) by mouth daily. 04/02/22   Celestia Rosaline SQUIBB, NP  hydrochlorothiazide  (HYDRODIURIL ) 25 MG tablet Take 1 tablet (25 mg total) by mouth daily. 08/21/23   Celestia Rosaline SQUIBB, NP  meloxicam  (MOBIC ) 15 MG tablet TAKE 1 TABLET(15 MG) BY MOUTH DAILY 09/11/22   Magnant, Charles L, PA-C  olopatadine  (PATANOL) 0.1 % ophthalmic solution Place 1 drop into the left eye 2 (two) times daily. 03/19/23   Rising, [place], PA-C    Allergies: Penicillins    Review of Systems  Updated Vital Signs BP (!) 157/86 (BP Location: Right Arm)   Pulse 65   Temp 97.9 F (36.6 C)   Resp 17   Wt 77.9 kg   SpO2 99%    BMI 22.66 kg/m   Physical Exam Constitutional:      General: He is not in acute distress. HENT:     Head: Normocephalic and atraumatic.  Eyes:     Conjunctiva/sclera: Conjunctivae normal.     Pupils: Pupils are equal, round, and reactive to light.  Cardiovascular:     Rate and Rhythm: Normal rate and regular rhythm.  Pulmonary:     Effort: Pulmonary effort is normal. No respiratory distress.     Breath sounds: Normal breath sounds.  Abdominal:     General: There is no distension.     Tenderness: There is no abdominal tenderness.  Musculoskeletal:     Comments: Chest pain is not reproducible with palpation of the chest wall or the trapezius muscles or spinal midline  Skin:    General: Skin is warm and dry.  Neurological:     General: No focal deficit present.     Mental Status: He is alert. Mental status is at baseline.  Psychiatric:        Mood and Affect: Mood normal.        Behavior: Behavior normal.     (all labs ordered are listed, but only abnormal results are displayed) Labs Reviewed  BASIC METABOLIC PANEL WITH GFR  CBC  I-STAT CHEM 8, ED  TROPONIN I (HIGH SENSITIVITY)    EKG: None  Radiology: No results found.  {Document cardiac monitor, telemetry assessment procedure when appropriate:32947} Procedures   Medications Ordered in the ED  oxyCODONE -acetaminophen  (PERCOCET/ROXICET) 5-325 MG per tablet 1 tablet (1 tablet Oral Given 06/04/24 0812)  HYDROmorphone  (DILAUDID ) injection 0.5 mg (has no administration in time range)  sodium chloride  0.9 % bolus 500 mL (has no administration in time range)      {Click here for ABCD2, HEART and other calculators REFRESH Note before signing:1}                              Medical Decision Making Amount and/or Complexity of Data Reviewed Labs: ordered. Radiology: ordered. ECG/medicine tests: ordered.  Risk Prescription drug management.   History of pain in his thoracic back, mostly right-sided but bilateral.   Pulses are equal bilaterally.  He does have smoking history is a risk factor for aneurysm.  No tachycardia on arrival.  Patient was given Percocet in triage.  Differential include musculoskeletal pain including disc disease versus radiculopathy versus aortic dissection versus small pneumothorax versus atypical ACS vs other  Pain seems to be worse with him sitting upright.  But it is not directly reproducible in this manner, nor to palpation on exam.  Nor was there trauma mechanism to suggest acute fracture.  Labs and imaging ordered.  IV pain medicine ordered.  Small IV fluid bolus ordered as patient's blood pressure 99/73mmhg here.  I personally reviewed and interpret the patient's labs and imaging, notable for ***  {Document critical care time when appropriate  Document review of labs and clinical decision tools ie CHADS2VASC2, etc  Document your independent review of radiology images and any outside records  Document your discussion with family members, caretakers and with consultants  Document social determinants of health affecting pt's care  Document your decision making why or why not admission, treatments were needed:32947:::1}   Final diagnoses:  None    ED Discharge Orders     None

## 2024-06-17 ENCOUNTER — Other Ambulatory Visit (INDEPENDENT_AMBULATORY_CARE_PROVIDER_SITE_OTHER): Payer: Self-pay | Admitting: Primary Care

## 2024-06-17 DIAGNOSIS — I10 Essential (primary) hypertension: Secondary | ICD-10-CM

## 2024-06-17 DIAGNOSIS — Z760 Encounter for issue of repeat prescription: Secondary | ICD-10-CM

## 2024-06-18 ENCOUNTER — Other Ambulatory Visit (INDEPENDENT_AMBULATORY_CARE_PROVIDER_SITE_OTHER): Payer: Self-pay | Admitting: Primary Care

## 2024-06-18 ENCOUNTER — Ambulatory Visit (INDEPENDENT_AMBULATORY_CARE_PROVIDER_SITE_OTHER): Admitting: Nurse Practitioner

## 2024-06-18 ENCOUNTER — Encounter: Payer: Self-pay | Admitting: Nurse Practitioner

## 2024-06-18 ENCOUNTER — Telehealth: Payer: Self-pay | Admitting: Gastroenterology

## 2024-06-18 VITALS — BP 109/62 | HR 54 | Ht 73.0 in | Wt 166.6 lb

## 2024-06-18 DIAGNOSIS — M545 Low back pain, unspecified: Secondary | ICD-10-CM | POA: Diagnosis not present

## 2024-06-18 DIAGNOSIS — K869 Disease of pancreas, unspecified: Secondary | ICD-10-CM

## 2024-06-18 DIAGNOSIS — I10 Essential (primary) hypertension: Secondary | ICD-10-CM

## 2024-06-18 DIAGNOSIS — E876 Hypokalemia: Secondary | ICD-10-CM | POA: Diagnosis not present

## 2024-06-18 DIAGNOSIS — Z760 Encounter for issue of repeat prescription: Secondary | ICD-10-CM

## 2024-06-18 DIAGNOSIS — G8929 Other chronic pain: Secondary | ICD-10-CM

## 2024-06-18 DIAGNOSIS — I700 Atherosclerosis of aorta: Secondary | ICD-10-CM

## 2024-06-18 DIAGNOSIS — R160 Hepatomegaly, not elsewhere classified: Secondary | ICD-10-CM

## 2024-06-18 MED ORDER — OXYCODONE-ACETAMINOPHEN 5-325 MG PO TABS: 1.0000 | ORAL_TABLET | Freq: Three times a day (TID) | ORAL | 0 refills | Status: DC | PRN

## 2024-06-18 NOTE — Telephone Encounter (Signed)
 Received a call from patients other provider stating patient needs nurse fu call to review urgent referral and imaging fo possible sooner appointments. Patient scheduled for 11/17. Please review and advise  Thank you

## 2024-06-18 NOTE — Progress Notes (Signed)
 Subjective   Patient ID: Roy Martinez, male    DOB: 1956/09/20, 68 y.o.   MRN: [id number]  Chief Complaint  Patient presents with   Follow-up    Pt was hospitalized for back pain and the pain has not subsided. Pt states he has been in pain even after the hospital.    Medication Refill    Pt states he needs a refilll on his hydrochlorothiazide  (HYDRODIURIL ) 25 MG tablet    Referring provider: Celestia Roy SQUIBB, NP  Roy Martinez is a 68 y.o. male with Past Medical History: No date: Chronic hepatitis C virus infection (HCC)     Comment:  under treatment June 2016 No date: H/O ETOH abuse No date: Hypertension No date: Smoker     Comment:  1/2 ppd   HPI  Patient presents today for an ED follow-up.  This is a patient of Roy Martinez.  Patient did recently have a CT scan while in the emergency room.  This scan did reveal cyst to right kidney, hypodensity to pancreatic tail and questionable area to the liver.  Also revealed cholelithiasis and aortic atherosclerosis.  We will place a referral for patient to GI.  We will also place a referral to patient to cardiology.  Patient will need to follow-up with his PCP for this as well.  Patient does need labs today due to hypokalemia and the ED.  He was given oral replacement in the ED. Denies f/c/s, n/v/d, hemoptysis, PND, leg swelling Denies chest pain or edema      Allergies  Allergen Reactions   Penicillins Hives    Childhood allergy Has patient had a PCN reaction causing immediate rash, facial/tongue/throat swelling, SOB or lightheadedness with hypotension: No Has patient had a PCN reaction causing severe rash involving mucus membranes or skin necrosis: No Has patient had a PCN reaction that required hospitalization No Has patient had a PCN reaction occurring within the last 10 years: No If all of the above answers are NO, then may proceed with Cephalosporin use.     Immunization History  Administered Date(s)  Administered   Fluad Trivalent(High Dose 65+) 08/21/2023   Influenza,inj,Quad PF,6+ Mos 05/31/2020, 06/20/2021, 07/09/2022   PFIZER(Purple Top)SARS-COV-2 Vaccination 12/11/2019, 01/01/2020   PNEUMOCOCCAL CONJUGATE-20 09/27/2021   Pneumococcal Polysaccharide-23 03/11/2017   Tdap 07/24/2012, 10/10/2022    Tobacco History: Social History   Tobacco Use  Smoking Status Every Day   Current packs/day: 0.50   Types: Cigarettes   Passive exposure: Never  Smokeless Tobacco Never  Tobacco Comments   cutting back   Ready to quit: Not Answered Counseling given: Not Answered Tobacco comments: cutting back   Outpatient Encounter Medications as of 06/18/2024  Medication Sig   baclofen  (LIORESAL ) 10 MG tablet Take 1 tablet (10 mg total) by mouth 3 (three) times daily.   Cholecalciferol (VITAMIN D3) 50 MCG (2000 UT) capsule Take 1 capsule (2,000 Units total) by mouth daily.   hydrochlorothiazide  (HYDRODIURIL ) 25 MG tablet Take 1 tablet (25 mg total) by mouth daily.   meloxicam  (MOBIC ) 15 MG tablet TAKE 1 TABLET(15 MG) BY MOUTH DAILY   methylPREDNISolone  (MEDROL  DOSEPAK) 4 MG TBPK tablet Use as directed   olopatadine  (PATANOL) 0.1 % ophthalmic solution Place 1 drop into the left eye 2 (two) times daily.   potassium chloride  (KLOR-CON ) 10 MEQ tablet Take 1 tablet (10 mEq total) by mouth daily for 12 doses.   [DISCONTINUED] oxyCODONE -acetaminophen  (PERCOCET/ROXICET) 5-325 MG tablet Take 1 tablet by mouth every 8 (eight) hours as  needed for up to 12 doses for severe pain (pain score 7-10).   oxyCODONE -acetaminophen  (PERCOCET/ROXICET) 5-325 MG tablet Take 1 tablet by mouth every 8 (eight) hours as needed for up to 12 doses for severe pain (pain score 7-10).   No facility-administered encounter medications on file as of 06/18/2024.    Review of Systems  Review of Systems  Constitutional: Negative.   HENT: Negative.    Cardiovascular: Negative.   Gastrointestinal: Negative.    Allergic/Immunologic: Negative.   Neurological: Negative.   Psychiatric/Behavioral: Negative.       Objective:   BP 109/62 (BP Location: Right Arm, Patient Position: Sitting)   Pulse (!) 54   Ht 6' 1 (1.854 m)   Wt 166 lb 9.6 oz (75.6 kg)   SpO2 98%   BMI 21.98 kg/m   Wt Readings from Last 5 Encounters:  06/18/24 166 lb 9.6 oz (75.6 kg)  06/04/24 171 lb 11.8 oz (77.9 kg)  08/21/23 171 lb 12.8 oz (77.9 kg)  02/12/23 169 lb 6.4 oz (76.8 kg)  01/11/23 169 lb 3.2 oz (76.7 kg)     Physical Exam Vitals and nursing note reviewed.  Constitutional:      General: He is not in acute distress.    Appearance: He is well-developed.  Cardiovascular:     Rate and Rhythm: Normal rate and regular rhythm.  Pulmonary:     Effort: Pulmonary effort is normal.     Breath sounds: Normal breath sounds.  Skin:    General: Skin is warm and dry.  Neurological:     Mental Status: He is alert and oriented to person, place, and time.       Assessment & Plan:   Aortic atherosclerosis -     Ambulatory referral to Cardiology  Pancreatic lesion -     Ambulatory referral to Gastroenterology  Liver mass -     Ambulatory referral to Gastroenterology  Hypokalemia -     CBC -     Comprehensive metabolic panel with GFR  Chronic bilateral low back pain without sciatica -     oxyCODONE -Acetaminophen ; Take 1 tablet by mouth every 8 (eight) hours as needed for up to 12 doses for severe pain (pain score 7-10).  Dispense: 12 tablet; Refill: 0     Return if symptoms worsen or fail to improve.   Roy GORMAN Borer, NP 06/18/2024

## 2024-06-18 NOTE — Telephone Encounter (Unsigned)
 Copied from CRM #[id number]. Topic: Clinical - Medication Refill >> Jun 18, 2024  4:08 PM Zebedee SAUNDERS wrote: Medication: hydrochlorothiazide  (HYDRODIURIL ) 25 MG tablet  Has the patient contacted their pharmacy? Yes (Agent: If no, request that the patient contact the pharmacy for the refill. If patient does not wish to contact the pharmacy document the reason why and proceed with request.) (Agent: If yes, when and what did the pharmacy advise?)Pharmacy need PCP approval  This is the patient's preferred pharmacy:  [id number] - [place], [place] - [address] AT [facility] OF [address] [place] [address] Phone: [phone] Fax: [phone]  Is this the correct pharmacy for this prescription? Yes If no, delete pharmacy and type the correct one.   Has the prescription been filled recently? Yes  Is the patient out of the medication? Yes  Has the patient been seen for an appointment in the last year OR does the patient have an upcoming appointment? Yes  Can we respond through MyChart? Yes  Agent: Please be advised that Rx refills may take up to 3 business days. We ask that you follow-up with your pharmacy.

## 2024-06-18 NOTE — Telephone Encounter (Signed)
 Dr Wilhelmenia please review for possible procedure vs sooner appt

## 2024-06-19 ENCOUNTER — Ambulatory Visit: Payer: Self-pay | Admitting: Nurse Practitioner

## 2024-06-19 ENCOUNTER — Other Ambulatory Visit (INDEPENDENT_AMBULATORY_CARE_PROVIDER_SITE_OTHER): Payer: Self-pay | Admitting: Primary Care

## 2024-06-19 DIAGNOSIS — I10 Essential (primary) hypertension: Secondary | ICD-10-CM

## 2024-06-19 DIAGNOSIS — D509 Iron deficiency anemia, unspecified: Secondary | ICD-10-CM

## 2024-06-19 DIAGNOSIS — Z760 Encounter for issue of repeat prescription: Secondary | ICD-10-CM

## 2024-06-19 LAB — CBC
Hematocrit: 38 % (ref 37.5–51.0)
Hemoglobin: 12.6 g/dL — ABNORMAL LOW (ref 13.0–17.7)
MCH: 31.3 pg (ref 26.6–33.0)
MCHC: 33.2 g/dL (ref 31.5–35.7)
MCV: 94 fL (ref 79–97)
Platelets: 344 x10E3/uL (ref 150–450)
RBC: 4.03 x10E6/uL — ABNORMAL LOW (ref 4.14–5.80)
RDW: 12 % (ref 11.6–15.4)
WBC: 6 x10E3/uL (ref 3.4–10.8)

## 2024-06-19 LAB — COMPREHENSIVE METABOLIC PANEL WITH GFR
ALT: 17 IU/L (ref 0–44)
AST: 16 IU/L (ref 0–40)
Albumin: 4 g/dL (ref 3.9–4.9)
Alkaline Phosphatase: 67 IU/L (ref 47–123)
BUN/Creatinine Ratio: 22 (ref 10–24)
BUN: 14 mg/dL (ref 8–27)
Bilirubin Total: 0.6 mg/dL (ref 0.0–1.2)
CO2: 23 mmol/L (ref 20–29)
Calcium: 8.9 mg/dL (ref 8.6–10.2)
Chloride: 99 mmol/L (ref 96–106)
Creatinine, Ser: 0.64 mg/dL — ABNORMAL LOW (ref 0.76–1.27)
Globulin, Total: 2.5 g/dL (ref 1.5–4.5)
Glucose: 97 mg/dL (ref 70–99)
Potassium: 3.7 mmol/L (ref 3.5–5.2)
Sodium: 137 mmol/L (ref 134–144)
Total Protein: 6.5 g/dL (ref 6.0–8.5)
eGFR: 104 mL/min/1.73 (ref >59)

## 2024-06-19 MED ORDER — FERROUS SULFATE 90 (18 FE) MG PO TABS
1.0000 | ORAL_TABLET | Freq: Every day | ORAL | 0 refills | Status: AC
Start: 2024-06-19 — End: ?

## 2024-06-19 NOTE — Telephone Encounter (Signed)
 Can one of you see if this pt can be seen sooner with any POD MD or app (see Dr Wilhelmenia message)

## 2024-06-19 NOTE — Telephone Encounter (Signed)
 This patient can be seen by any GI MD or APP in any Pod. Let schedulers find the slot. GM

## 2024-06-19 NOTE — Telephone Encounter (Signed)
 OFFICE VISIT NEEDED FOR ADDITIONAL REFILLS.  Requested Prescriptions  Pending Prescriptions Disp Refills   hydrochlorothiazide  (HYDRODIURIL ) 25 MG tablet [Pharmacy Med Name: HYDROCHLOROTHIAZIDE  25MG  TABLETS] 30 tablet 0    Sig: TAKE 1 TABLET(25 MG) BY MOUTH DAILY     Cardiovascular: Diuretics - Thiazide Failed - 06/19/2024 10:56 AM      Failed - Cr in normal range and within 180 days    Creat  Date Value Ref Range Status  03/11/2017 0.72 0.70 - 1.25 mg/dL Final    Comment:      For patients > or = 68 years of age: The upper reference limit for Creatinine is approximately 13% higher for people identified as African-American.      Creatinine, Ser  Date Value Ref Range Status  06/18/2024 0.64 (L) 0.76 - 1.27 mg/dL Final         Failed - Valid encounter within last 6 months    Recent Outpatient Visits           10 months ago Essential hypertension   [place] [facility] Celestia Rosaline SQUIBB, NP   1 year ago Encounter for Medicare annual wellness exam   [place] [facility] Celestia Rosaline SQUIBB, NP   1 year ago Screening for diabetes mellitus   [place] [facility] Celestia Rosaline SQUIBB, NP   1 year ago Essential hypertension   [place] [facility] Celestia Rosaline SQUIBB, NP   1 year ago Essential hypertension   [place] [facility] Celestia Rosaline SQUIBB, NP              Passed - K in normal range and within 180 days    Potassium  Date Value Ref Range Status  06/18/2024 3.7 3.5 - 5.2 mmol/L Final         Passed - Na in normal range and within 180 days    Sodium  Date Value Ref Range Status  06/18/2024 137 134 - 144 mmol/L Final         Passed - Last BP in normal range    BP Readings from Last 1 Encounters:  06/18/24 109/62

## 2024-06-19 NOTE — Telephone Encounter (Signed)
 Noted

## 2024-06-19 NOTE — Telephone Encounter (Signed)
 Requested Prescriptions  Refused Prescriptions Disp Refills   hydrochlorothiazide  (HYDRODIURIL ) 25 MG tablet 90 tablet 1    Sig: Take 1 tablet (25 mg total) by mouth daily.     Cardiovascular: Diuretics - Thiazide Failed - 06/19/2024  2:45 PM      Failed - Cr in normal range and within 180 days    Creat  Date Value Ref Range Status  03/11/2017 0.72 0.70 - 1.25 mg/dL Final    Comment:      For patients > or = 68 years of age: The upper reference limit for Creatinine is approximately 13% higher for people identified as African-American.      Creatinine, Ser  Date Value Ref Range Status  06/18/2024 0.64 (L) 0.76 - 1.27 mg/dL Final         Failed - Valid encounter within last 6 months    Recent Outpatient Visits           10 months ago Essential hypertension   [place] [facility] Celestia Rosaline SQUIBB, NP   1 year ago Encounter for Medicare annual wellness exam   [place] [facility] Celestia Rosaline SQUIBB, NP   1 year ago Screening for diabetes mellitus   [place] [facility] Celestia Rosaline SQUIBB, NP   1 year ago Essential hypertension   [place] [facility] Celestia Rosaline SQUIBB, NP   1 year ago Essential hypertension   [place] [facility] Celestia Rosaline SQUIBB, NP              Passed - K in normal range and within 180 days    Potassium  Date Value Ref Range Status  06/18/2024 3.7 3.5 - 5.2 mmol/L Final         Passed - Na in normal range and within 180 days    Sodium  Date Value Ref Range Status  06/18/2024 137 134 - 144 mmol/L Final         Passed - Last BP in normal range    BP Readings from Last 1 Encounters:  06/18/24 109/62

## 2024-06-22 ENCOUNTER — Inpatient Hospital Stay (INDEPENDENT_AMBULATORY_CARE_PROVIDER_SITE_OTHER): Admitting: Primary Care

## 2024-06-24 NOTE — Progress Notes (Unsigned)
 06/25/2024 Roy Martinez [id number] 1956/03/31  Referring provider: Celestia Rosaline SQUIBB, NP Primary GI doctor: Dr. Legrand  ASSESSMENT AND PLAN:  Pancreatic Ovid hypodensity No family history of pancreatic issues, no personal history of pancreatitis Previous drinking history with liqueur, has only been drinking beer daily or most days a week for the last year, tobacco use 1.8 x 1.3 cm incompletely characterized suggest MRI MRCP Patient does have history of cholelithiasis but no history of pancreatitis Denies history of jaundice, dark urine, clay stools, AB swelling, etc -Will schedule MRI MRCP to evaluate pancreatic and hepatic cyst better. 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 Legrand poor prep with Suprep took a.m. dose too late and without additional water preparation of colon was poor stool throughout the entire examined colon 2 polyps 3 to 6 mm transverse ascending colon internal hemorrhoids recall 1 year  Will schedule colonoscopy at [facility] with Dr. Legrand with split dose GoLytely and close retention prep instructions We have discussed the risks of bleeding, infection, perforation, medication reactions, and remote risk of death associated with colonoscopy. All questions were answered and the patient acknowledges these risk and wishes to proceed.  Right renal cyst 06/04/2024 increase interval size of right renal cyst 2.9 previously 1.0 also periphery of the  cyst is wedge-shaped cortical hypoattenuation potentially pyelonephritis versus infarct -Will proceed with MRCP, follow up PCP -No urinary symptoms  History of hepatitis C    Latest Ref Rng & Units 06/18/2024   11:55 AM 01/11/2023    9:10 AM 12/25/2021    2:49 PM  Hepatic Function  Total Protein 6.0 - 8.5 g/dL 6.5  6.7  7.3   Albumin 3.9 - 4.9 g/dL 4.0  4.1  4.4   AST 0 - 40 IU/L 16  22  28    ALT 0 - 44 IU/L 17  17  25    Alk Phosphatase 47 - 123 IU/L 67  72  79   Total Bilirubin 0.0 - 1.2 mg/dL 0.6  0.4  0.4    Platelets 344  03/20/2017 abdominal ultrasound with elastography shows cystic areas within right lobe liver largest 2.8 cm elastography F0 to F1 Hep C RNA quant 2018 not detected 06/04/2024 CTA for back pain showed interval increase size segment 8 cyst measuring 3.3 x 2.9 cm previously 2.8 x 2.5 no intra or extrahepatic biliary ductal dilation cholelithiasis -Check INR, AFP with hep C history -Get MRCP/MRI to evaluate further -Consider fibroscan/elastography - advised to stop ETOH  Decreased appetite Decreased appetite possibly linked to grief reaction, no gastrointestinal symptoms reported. - Evaluate for underlying medical causes. - Consider counseling for psychological factors.  Grief reaction after loss of son Grief reaction with decreased appetite and mood changes post son's suicide. - Recommend counseling for grief support.  Tobacco use Current tobacco use increases risk for aortic atherosclerosis and pancreatic issues. - Advise smoking cessation.  Alcohol use, reduced Significant reduction in alcohol consumption over the past year. - Encourage continued reduction.  Patient Care Team: Celestia Rosaline SQUIBB,  NP as PCP - General (Internal Medicine)  HISTORY OF PRESENT ILLNESS: 68 y.o. male with a past medical history listed below presents for evaluation of abnormal CTA.   Patient last seen at the [facility] by Dr. Clayburn 07/20/2022 for surveillance  colonoscopy.  Discussed the use of AI scribe software for clinical note transcription with the patient, who gave verbal consent to proceed.  History of Present Illness   Roy Martinez is a 68 year old male with a history of hepatitis C who presents for evaluation of liver and renal cysts identified on CT angiography. He was referred by Dr. Legrand for evaluation of liver and renal cysts identified during a CT angiography performed for back pain.  He underwent a CT angiography for back pain, which incidentally revealed an increase in the size of a liver cyst and a right renal cyst. The liver cyst had been noted previously but has shown some enlargement. He has a history of hepatitis C, which was treated, though the exact date of treatment is not recalled.  He has a history of gallstones but denies any history of pancreatitis or pancreatic inflammation. There is no family history of liver or pancreatic disease. He has significantly reduced his alcohol consumption over the past year, stating he doesn't drink anymore, although he occasionally drinks beer, sometimes daily, but not as frequently as before.  No symptoms such as dark black stool, blood in the stool, trouble swallowing, heartburn, nausea, vomiting, yellowing of the eyes, dark urine, or clay-colored stool. He also reports no shortness of breath or chest discomfort. His back pain, which was severe enough to prevent him from getting out of bed, has since resolved.  He has a history of smoking.  He mentions a decreased appetite, which he attributes to the emotional impact following his son's suicide in May of this year. He has experienced significant emotional distress, including periods of excessive sleeping and social withdrawal, but has not resorted to substance use for coping.      He  reports that he has been smoking cigarettes. He has never been exposed to tobacco smoke. He has never used smokeless tobacco. He reports current alcohol use of  about 5.0 - 6.0 standard drinks of alcohol per week. He reports that he does not currently use drugs after having used the following drugs: Crack cocaine.  RELEVANT GI HISTORY, IMAGING AND LABS: Results   RADIOLOGY CT angiography: Increase in liver cyst, incomplete characterization of aortic atherosclerosis, right renal cyst  PROCEDURE NOTES Colonoscopy: Polyps removed (October 2023)      CBC    Component Value Date/Time   WBC 6.0 06/18/2024 1155   WBC 7.3 06/04/2024 1006   RBC 4.03 (L) 06/18/2024 1155   RBC 4.03 (L) 06/04/2024 1006   HGB 12.6 (L) 06/18/2024 1155   HCT 38.0 06/18/2024 1155   PLT 344 06/18/2024 1155   MCV 94 06/18/2024 1155   MCH 31.3 06/18/2024 1155   MCH 30.5 06/04/2024 1006   MCHC 33.2 06/18/2024 1155   MCHC 32.5 06/04/2024 1006   RDW 12.0 06/18/2024 1155   LYMPHSABS 1.8 01/11/2023 0910   MONOABS 517 03/11/2017 0953   EOSABS 0.2 01/11/2023 0910   BASOSABS 0.0 01/11/2023 0910   Recent Labs    06/04/24 1006 06/04/24 1042 06/18/24 1155  HGB 12.3* 12.9* 12.6*    CMP     Component Value Date/Time   NA 137 06/18/2024 1155   K 3.7 06/18/2024 1155   CL 99 06/18/2024 1155   CO2 23  06/18/2024 1155   GLUCOSE 97 06/18/2024 1155   GLUCOSE 111 (H) 06/04/2024 1042   BUN 14 06/18/2024 1155   CREATININE 0.64 (L) 06/18/2024 1155   CREATININE 0.72 03/11/2017 0953   CALCIUM 8.9 06/18/2024 1155   PROT 6.5 06/18/2024 1155   ALBUMIN 4.0 06/18/2024 1155   AST 16 06/18/2024 1155   ALT 17 06/18/2024 1155   ALT 33 03/11/2017 0953   ALKPHOS 67 06/18/2024 1155   BILITOT 0.6 06/18/2024 1155   GFRNONAA >60 06/04/2024 1006   GFRNONAA >89 03/11/2017 0953   GFRAA 113 05/31/2020 1128   GFRAA >89 03/11/2017 0953      Latest Ref Rng & Units 06/18/2024   11:55 AM 01/11/2023    9:10 AM 12/25/2021    2:49 PM  Hepatic Function  Total Protein 6.0 - 8.5 g/dL 6.5  6.7  7.3   Albumin 3.9 - 4.9 g/dL 4.0  4.1  4.4   AST 0 - 40 IU/L 16  22  28    ALT 0 - 44 IU/L 17  17  25     Alk Phosphatase 47 - 123 IU/L 67  72  79   Total Bilirubin 0.0 - 1.2 mg/dL 0.6  0.4  0.4       Latest Ref Rng & Units 03/11/2017    9:53 AM 05/02/2017    2:46 PM  Hepatitis C  Hep C Genotype  1a    HCV Quanitative NOT DETECTED IU/mL  <15 NOT DETECTED   HCV Quanitative Log NOT DETECTED Log IU/mL  <1.18 NOT DETECTED     Current Medications:   Current Outpatient Medications (Endocrine & Metabolic):    methylPREDNISolone  (MEDROL  DOSEPAK) 4 MG TBPK tablet, Use as directed  Current Outpatient Medications (Cardiovascular):    hydrochlorothiazide  (HYDRODIURIL ) 25 MG tablet, TAKE 1 TABLET(25 MG) BY MOUTH DAILY   Current Outpatient Medications (Analgesics):    meloxicam  (MOBIC ) 15 MG tablet, TAKE 1 TABLET(15 MG) BY MOUTH DAILY   oxyCODONE -acetaminophen  (PERCOCET/ROXICET) 5-325 MG tablet, Take 1 tablet by mouth every 8 (eight) hours as needed for up to 12 doses for severe pain (pain score 7-10).  Current Outpatient Medications (Hematological):    Ferrous Sulfate  90 (18 Fe) MG TABS, Take 1 tablet by mouth daily.  Current Outpatient Medications (Other):    baclofen  (LIORESAL ) 10 MG tablet, Take 1 tablet (10 mg total) by mouth 3 (three) times daily.   Cholecalciferol (VITAMIN D3) 50 MCG (2000 UT) capsule, Take 1 capsule (2,000 Units total) by mouth daily.   olopatadine  (PATANOL) 0.1 % ophthalmic solution, Place 1 drop into the left eye 2 (two) times daily.   potassium chloride  (KLOR-CON ) 10 MEQ tablet, Take 1 tablet (10 mEq total) by mouth daily for 12 doses.  Medical History:  Past Medical History:  Diagnosis Date   Chronic hepatitis C virus infection (HCC)    under treatment June 2016   H/O ETOH abuse    Hypertension    Smoker    1/2 ppd   Allergies:  Allergies  Allergen Reactions   Penicillins Hives    Childhood allergy Has patient had a PCN reaction causing immediate rash, facial/tongue/throat swelling, SOB or lightheadedness with hypotension: No Has patient had a PCN reaction  causing severe rash involving mucus membranes or skin necrosis: No Has patient had a PCN reaction that required hospitalization No Has patient had a PCN reaction occurring within the last 10 years: No If all of the above answers are NO, then may proceed with Cephalosporin use.  Surgical History:  He  has a past surgical history that includes Knee arthroscopy; Patellar tendon repair (Right, 06/26/2016); Knee arthroscopy with meniscal repair (Right, 06/26/2016); Colonoscopy; and Polypectomy. Family History:  His family history includes Arrhythmia in his brother; Heart disease in his mother; Heart failure in his father and mother.  REVIEW OF SYSTEMS  : All other systems reviewed and negative except where noted in the History of Present Illness.  PHYSICAL EXAM: BP 122/70   Pulse 60   Ht 6' 1 (1.854 m)   Wt 171 lb (77.6 kg)   BMI 22.56 kg/m  Physical Exam   GENERAL APPEARANCE: Well nourished, in no apparent distress. HEENT: No cervical lymphadenopathy, unremarkable thyroid , sclerae anicteric, conjunctiva pink. RESPIRATORY: Respiratory effort normal, breath sounds clear to auscultation bilaterally without rales, rhonchi, or wheezing. CARDIO: Regular rate and rhythm with no murmurs, rubs, or gallops, peripheral pulses intact. ABDOMEN: Soft, non-distended, active bowel sounds in all four quadrants, non-tender, no rebound, no mass appreciated. RECTAL: Declines. MUSCULOSKELETAL: Full range of motion, normal gait, without edema. SKIN: Dry, intact without rashes or lesions. No jaundice. NEURO: Alert, oriented, no focal deficits. PSYCH: Cooperative, normal mood and affect.      Alan JONELLE Coombs, PA-C 10:31 AM

## 2024-06-25 ENCOUNTER — Encounter: Payer: Self-pay | Admitting: Physician Assistant

## 2024-06-25 ENCOUNTER — Ambulatory Visit: Admitting: Physician Assistant

## 2024-06-25 ENCOUNTER — Other Ambulatory Visit

## 2024-06-25 VITALS — BP 122/70 | HR 60 | Ht 73.0 in | Wt 171.0 lb

## 2024-06-25 DIAGNOSIS — N281 Cyst of kidney, acquired: Secondary | ICD-10-CM | POA: Diagnosis not present

## 2024-06-25 DIAGNOSIS — K8689 Other specified diseases of pancreas: Secondary | ICD-10-CM

## 2024-06-25 DIAGNOSIS — Z8619 Personal history of other infectious and parasitic diseases: Secondary | ICD-10-CM

## 2024-06-25 DIAGNOSIS — K869 Disease of pancreas, unspecified: Secondary | ICD-10-CM | POA: Diagnosis not present

## 2024-06-25 DIAGNOSIS — K7689 Other specified diseases of liver: Secondary | ICD-10-CM

## 2024-06-25 DIAGNOSIS — R933 Abnormal findings on diagnostic imaging of other parts of digestive tract: Secondary | ICD-10-CM | POA: Diagnosis not present

## 2024-06-25 DIAGNOSIS — Z860100 Personal history of colon polyps, unspecified: Secondary | ICD-10-CM

## 2024-06-25 LAB — BASIC METABOLIC PANEL WITH GFR
BUN: 15 mg/dL (ref 6–23)
CO2: 28 meq/L (ref 19–32)
Calcium: 9 mg/dL (ref 8.4–10.5)
Chloride: 102 meq/L (ref 96–112)
Creatinine, Ser: 0.67 mg/dL (ref 0.40–1.50)
GFR: 96.43 mL/min (ref >60.00)
Glucose, Bld: 101 mg/dL — ABNORMAL HIGH (ref 70–99)
Potassium: 4.4 meq/L (ref 3.5–5.1)
Sodium: 138 meq/L (ref 135–145)

## 2024-06-25 LAB — CBC WITH DIFFERENTIAL/PLATELET
Basophils Absolute: 0.1 K/uL (ref 0.0–0.1)
Basophils Relative: 1.1 % (ref 0.0–3.0)
Eosinophils Absolute: 0.2 K/uL (ref 0.0–0.7)
Eosinophils Relative: 3.2 % (ref 0.0–5.0)
HCT: 38.7 % — ABNORMAL LOW (ref 39.0–52.0)
Hemoglobin: 12.7 g/dL — ABNORMAL LOW (ref 13.0–17.0)
Lymphocytes Relative: 36.4 % (ref 12.0–46.0)
Lymphs Abs: 1.8 K/uL (ref 0.7–4.0)
MCHC: 32.9 g/dL (ref 30.0–36.0)
MCV: 94.7 fl (ref 78.0–100.0)
Monocytes Absolute: 0.6 K/uL (ref 0.1–1.0)
Monocytes Relative: 12.2 % — ABNORMAL HIGH (ref 3.0–12.0)
Neutro Abs: 2.3 K/uL (ref 1.4–7.7)
Neutrophils Relative %: 47.1 % (ref 43.0–77.0)
Platelets: 316 K/uL (ref 150.0–400.0)
RBC: 4.08 Mil/uL — ABNORMAL LOW (ref 4.22–5.81)
RDW: 14.1 % (ref 11.5–15.5)
WBC: 4.9 K/uL (ref 4.0–10.5)

## 2024-06-25 LAB — HEPATIC FUNCTION PANEL
ALT: 16 U/L (ref 0–53)
AST: 19 U/L (ref 0–37)
Albumin: 4.1 g/dL (ref 3.5–5.2)
Alkaline Phosphatase: 53 U/L (ref 39–117)
Bilirubin, Direct: 0.1 mg/dL (ref 0.0–0.3)
Total Bilirubin: 0.7 mg/dL (ref 0.2–1.2)
Total Protein: 7 g/dL (ref 6.0–8.3)

## 2024-06-25 LAB — LIPASE: Lipase: 17 U/L (ref 11.0–59.0)

## 2024-06-25 LAB — GAMMA GT: GGT: 27 U/L (ref 7–51)

## 2024-06-25 LAB — PROTIME-INR
INR: 1 ratio (ref 0.8–1.0)
Prothrombin Time: 11 s (ref 9.6–13.1)

## 2024-06-25 MED ORDER — PEG 3350-KCL-NA BICARB-NACL 420 G PO SOLR: 4000.0000 mL | Freq: Once | ORAL | 0 refills | Status: AC

## 2024-06-25 NOTE — Patient Instructions (Addendum)
 Your provider has requested that you go to the basement level for lab work before leaving today. Press B on the elevator. The lab is located at the first door on the left as you exit the elevator.  Shows aortic atherosclerosis, this is plaque build up on the large vessel that runs from your heart down the length of your body, this is a warning sign, suggest stop smoking if you are, control blood pressure, cholesterol, sugars, increase exercise. Can discuss with PCP.  Metabolic dysfunction associated seatohepatitis  Now the leading cause of liver failure in the [place] .  It is normally from such risk factors as obesity, diabetes, insulin resistance, high cholesterol, or metabolic syndrome.  The only definitive therapy is weight loss and exercise.   Suggest walking 20-30 mins daily.  Decreasing carbohydrates, increasing veggies.    Fatty Liver Fatty liver is the accumulation of fat in liver cells. It is also called hepatosteatosis or steatohepatitis. It is normal for your liver to contain some fat. If fat is more than 5 to 10% of your liver's weight, you have fatty liver.  There are often no symptoms (problems) for years while damage is still occurring. People often learn about their fatty liver when they have medical tests for other reasons. Fat can damage your liver for years or even decades without causing problems. When it becomes severe, it can cause fatigue, weight loss, weakness, and confusion. This makes you more likely to develop more serious liver problems. The liver is the largest organ in the body. It does a lot of work and often gives no warning signs when it is sick until late in a disease. The liver has many important jobs including: Breaking down foods. Storing vitamins, iron, and other minerals. Making proteins. Making bile for food digestion. Breaking down many products including medications, alcohol and some poisons.  PROGNOSIS  Fatty liver may cause no damage or it  can lead to an inflammation of the liver. This is, called steatohepatitis.  Over time the liver may become scarred and hardened. This condition is called cirrhosis. Cirrhosis is serious and may lead to liver failure or cancer. NASH is one of the leading causes of cirrhosis. About 10-20% of Americans have fatty liver and a smaller 2-5% has NASH.  TREATMENT  Weight loss, fat restriction, and exercise in overweight patients produces inconsistent results but is worth trying. Good control of diabetes may reduce fatty liver. Eat a balanced, healthy diet. Increase your physical activity. There are no medical or surgical treatments for a fatty liver or NASH, but improving your diet and increasing your exercise may help prevent or reverse some of the damage. You have been scheduled for a colonoscopy. Please follow written instructions given to you at your visit today.   If you use inhalers (even only as needed), please bring them with you on the day of your procedure.  DO NOT TAKE 7 DAYS PRIOR TO TEST- Trulicity (dulaglutide) Ozempic, Wegovy (semaglutide) Mounjaro (tirzepatide) Bydureon Bcise (exanatide extended release)  DO NOT TAKE 1 DAY PRIOR TO YOUR TEST Rybelsus (semaglutide) Adlyxin (lixisenatide) Victoza (liraglutide) Byetta (exanatide) ___________________________________________________________________________    Roy Martinez will be contacted by [facility] Scheduling in the next 2 days to arrange a MRCP.  The number on your caller ID will be [phone], please answer when they call.  If you have not heard from them in 2 days please call [phone] to schedule.    Due to recent changes in healthcare laws, you may see  the results of your imaging and laboratory studies on MyChart before your provider has had a chance to review them.  We understand that in some cases there may be results that are confusing or concerning to you. Not all laboratory results come back in the same time frame  and the provider may be waiting for multiple results in order to interpret others.  Please give us  48 hours in order for your provider to thoroughly review all the results before contacting the office for clarification of your results.    I appreciate the  opportunity to care for you  Thank You   [facility]

## 2024-06-25 NOTE — Progress Notes (Signed)
 ____________________________________________________________  Attending physician addendum:  Thank you for sending this case to me. I have reviewed the entire note and agree with the plan.  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 Needs better characterization with MR study  Victory Brand, MD  ____________________________________________________________

## 2024-06-25 NOTE — Progress Notes (Signed)
 Agree with further characterization before more invasive EUS with MRI/MRCP as able to evaluate pancreatic lesion.

## 2024-06-26 ENCOUNTER — Ambulatory Visit (INDEPENDENT_AMBULATORY_CARE_PROVIDER_SITE_OTHER)

## 2024-06-26 ENCOUNTER — Ambulatory Visit: Payer: Self-pay | Admitting: Physician Assistant

## 2024-06-26 DIAGNOSIS — D649 Anemia, unspecified: Secondary | ICD-10-CM

## 2024-06-26 LAB — IBC + FERRITIN
Ferritin: 17.9 ng/mL — ABNORMAL LOW (ref 22.0–322.0)
Iron: 102 ug/dL (ref 42–165)
Saturation Ratios: 26.3 % (ref 20.0–50.0)
TIBC: 387.8 ug/dL (ref 250.0–450.0)
Transferrin: 277 mg/dL (ref 212.0–360.0)

## 2024-06-29 ENCOUNTER — Ambulatory Visit: Payer: Self-pay | Admitting: Physician Assistant

## 2024-06-29 LAB — CANCER ANTIGEN 19-9: CA 19-9: 18 U/mL (ref <34)

## 2024-06-29 LAB — AFP TUMOR MARKER: AFP-Tumor Marker: 3.6 ng/mL (ref <6.1)

## 2024-07-08 ENCOUNTER — Other Ambulatory Visit: Payer: Self-pay | Admitting: Physician Assistant

## 2024-07-08 ENCOUNTER — Ambulatory Visit (HOSPITAL_COMMUNITY)
Admission: RE | Admit: 2024-07-08 | Discharge: 2024-07-08 | Disposition: A | Discharge status: Home / Self Care | Source: Ambulatory Visit | Attending: Physician Assistant | Admitting: Physician Assistant

## 2024-07-08 DIAGNOSIS — K7689 Other specified diseases of liver: Secondary | ICD-10-CM | POA: Insufficient documentation

## 2024-07-08 DIAGNOSIS — N281 Cyst of kidney, acquired: Secondary | ICD-10-CM | POA: Insufficient documentation

## 2024-07-08 DIAGNOSIS — K8020 Calculus of gallbladder without cholecystitis without obstruction: Secondary | ICD-10-CM | POA: Diagnosis not present

## 2024-07-08 DIAGNOSIS — R1900 Intra-abdominal and pelvic swelling, mass and lump, unspecified site: Secondary | ICD-10-CM | POA: Diagnosis not present

## 2024-07-08 DIAGNOSIS — K869 Disease of pancreas, unspecified: Secondary | ICD-10-CM

## 2024-07-08 DIAGNOSIS — R109 Unspecified abdominal pain: Secondary | ICD-10-CM | POA: Diagnosis not present

## 2024-07-08 DIAGNOSIS — K8689 Other specified diseases of pancreas: Secondary | ICD-10-CM | POA: Diagnosis not present

## 2024-07-08 MED ORDER — GADOBUTROL 1 MMOL/ML IV SOLN
7.0000 mL | Freq: Once | INTRAVENOUS | Status: AC | PRN
  Administered 2024-07-08: 7 mL via INTRAVENOUS

## 2024-07-09 NOTE — Telephone Encounter (Signed)
 Reached patient and provided MRI results.  Patient understands he will need another MRI w/out contrast to assess the pancreas mass. Will put in recall to reach out to patient in 09/2024

## 2024-07-10 ENCOUNTER — Telehealth (INDEPENDENT_AMBULATORY_CARE_PROVIDER_SITE_OTHER): Payer: Self-pay

## 2024-07-10 NOTE — Telephone Encounter (Unsigned)
 Copied from CRM #[id number]. Topic: Clinical - Prescription Issue >> Jul 09, 2024  2:06 PM Gustabo D wrote: Pt says his insurance won't cover ferralet B12 is there a generic he can take

## 2024-07-24 ENCOUNTER — Other Ambulatory Visit (INDEPENDENT_AMBULATORY_CARE_PROVIDER_SITE_OTHER): Payer: Self-pay | Admitting: Primary Care

## 2024-07-24 DIAGNOSIS — Z760 Encounter for issue of repeat prescription: Secondary | ICD-10-CM

## 2024-07-24 DIAGNOSIS — I10 Essential (primary) hypertension: Secondary | ICD-10-CM

## 2024-07-27 NOTE — Telephone Encounter (Signed)
 Courtesy refill given, appointment needed.   Requested Prescriptions  Pending Prescriptions Disp Refills   hydrochlorothiazide  (HYDRODIURIL ) 25 MG tablet [Pharmacy Med Name: HYDROCHLOROTHIAZIDE  25MG  TABLETS] 90 tablet     Sig: TAKE 1 TABLET(25 MG) BY MOUTH DAILY     Cardiovascular: Diuretics - Thiazide Failed - 07/27/2024 12:30 PM      Failed - Valid encounter within last 6 months    Recent Outpatient Visits           11 months ago Essential hypertension   [place] [facility] Celestia Rosaline SQUIBB, NP   1 year ago Encounter for Medicare annual wellness exam   [place] [facility] Celestia Rosaline SQUIBB, NP   1 year ago Screening for diabetes mellitus   [place] [facility] Celestia Rosaline SQUIBB, NP   1 year ago Essential hypertension   [place] [facility] Celestia Rosaline SQUIBB, NP   2 years ago Essential hypertension   [place] [facility] Celestia Rosaline SQUIBB, NP              Passed - Cr in normal range and within 180 days    Creat  Date Value Ref Range Status  03/11/2017 0.72 0.70 - 1.25 mg/dL Final    Comment:      For patients > or = 68 years of age: The upper reference limit for Creatinine is approximately 13% higher for people identified as African-American.      Creatinine, Ser  Date Value Ref Range Status  06/25/2024 0.67 0.40 - 1.50 mg/dL Final         Passed - K in normal range and within 180 days    Potassium  Date Value Ref Range Status  06/25/2024 4.4 3.5 - 5.1 mEq/L Final         Passed - Na in normal range and within 180 days    Sodium  Date Value Ref Range Status  06/25/2024 138 135 - 145 mEq/L Final  06/18/2024 137 134 - 144 mmol/L Final         Passed - Last BP in normal range    BP Readings from Last 1 Encounters:  06/25/24 122/70

## 2024-07-29 ENCOUNTER — Telehealth: Payer: Self-pay

## 2024-07-29 ENCOUNTER — Telehealth: Payer: Self-pay | Admitting: Gastroenterology

## 2024-07-29 NOTE — Telephone Encounter (Signed)
 Good afternoon Dr. Legrand,   Patient called stating that he needed help with understanding what he needed to do for prepping for his procedure for tomorrow at 11/6 at 9:00. I advised him that he should have taken Dulcolax at 3:00 and start his liquid at 5:00. Patient states that he did not take the Dulcolax and he had also ate today.  Patient was rescheduled for 12/11 at 9:30.  CMA,  Patient does need new prep instructions and does need a call to discuss. Please advise.

## 2024-07-29 NOTE — Telephone Encounter (Signed)
 Copied from CRM [id number]. Topic: Clinical - Prescription Issue >> Jul 29, 2024  1:45 PM Roy Martinez wrote: Reason for CRM: Pt called in about medication  hydrochlorothiazide  (HYDRODIURIL )  pt went to pharmacy and was not filled.   Per notes seems as courtesy refill was given but pharmacy never received.   Can this be resent for pt. Appt scheduled

## 2024-07-30 ENCOUNTER — Encounter: Admitting: Gastroenterology

## 2024-07-30 NOTE — Telephone Encounter (Signed)
 I think the pre-op appointment is a good idea since he is having trouble understanding the instructions at home.

## 2024-07-30 NOTE — Telephone Encounter (Signed)
 Understood, thanks.  Robin, please assist with patient prep for rescheduled exam. (You were the CMA for [facility] when this patient was in clinic recently and set up for procedure)  - H Danis

## 2024-07-30 NOTE — Telephone Encounter (Signed)
 I am going to give patient a pre-op appointment only because it was 4 weeks in between his office appt and procedure. And he forgot how to follow his instructions

## 2024-07-31 NOTE — Telephone Encounter (Signed)
 I tried to contact patient to offer a pre-visit appointment to help him better to remember and understand his prep.   No answer. I left a detailed message to contact the office to be scheduled for a pre op appt since he didn't understand his last instructions.

## 2024-07-31 NOTE — Telephone Encounter (Signed)
 Reviewed chart rx was sent on 06/19/24 pt picked up rx on 06/22/24.

## 2024-07-31 NOTE — Telephone Encounter (Signed)
 Thank you Grayce! I will try to reach out to him next week if he hasn't returned our call.

## 2024-08-01 ENCOUNTER — Encounter (HOSPITAL_COMMUNITY): Payer: Self-pay | Admitting: *Deleted

## 2024-08-01 ENCOUNTER — Ambulatory Visit (HOSPITAL_COMMUNITY): Admission: EM | Admit: 2024-08-01 | Discharge: 2024-08-01 | Disposition: A | Discharge status: Home / Self Care

## 2024-08-01 DIAGNOSIS — Z760 Encounter for issue of repeat prescription: Secondary | ICD-10-CM

## 2024-08-01 DIAGNOSIS — I10 Essential (primary) hypertension: Secondary | ICD-10-CM

## 2024-08-01 MED ORDER — HYDROCHLOROTHIAZIDE 25 MG PO TABS: 25.0000 mg | ORAL_TABLET | Freq: Every day | ORAL | 0 refills | Status: DC

## 2024-08-01 NOTE — ED Triage Notes (Signed)
 Pt states he has been out of his BP meds (hydrochlorothiazide ) since 07/24/2024, he has been feeling lightheaded today.

## 2024-08-01 NOTE — ED Provider Notes (Signed)
 [facility]  Note:  This document was prepared using Conservation officer, historic buildings and may include unintentional dictation errors.  MRN: [id number] DOB: 05-Sep-1956  Subjective:   Roy Martinez is a 68 y.o. male presenting for medication refill for hydrochlorothiazide .  Patient reports that he has been out of medication since 07/24/2024.  Patient states that he is having some mild lightheadedness today which prompted him to come to urgent care for evaluation and prescription for blood pressure medication.  Patient has no chest pain, shortness of breath, weakness, dizziness, headache, blurred vision, fatigue.  Patient denies any other secondary medical concern at this time.  No current facility-administered medications for this encounter.  Current Outpatient Medications:    Cholecalciferol (VITAMIN D3) 50 MCG (2000 UT) capsule, Take 1 capsule (2,000 Units total) by mouth daily., Disp: 90 capsule, Rfl: 1   Ferrous Sulfate  90 (18 Fe) MG TABS, Take 1 tablet by mouth daily., Disp: 30 tablet, Rfl: 0   baclofen  (LIORESAL ) 10 MG tablet, Take 1 tablet (10 mg total) by mouth 3 (three) times daily., Disp: 30 each, Rfl: 0   hydrochlorothiazide  (HYDRODIURIL ) 25 MG tablet, Take 1 tablet (25 mg total) by mouth daily., Disp: 30 tablet, Rfl: 0   meloxicam  (MOBIC ) 15 MG tablet, TAKE 1 TABLET(15 MG) BY MOUTH DAILY, Disp: 90 tablet, Rfl: 0   methylPREDNISolone  (MEDROL  DOSEPAK) 4 MG TBPK tablet, Use as directed, Disp: 21 tablet, Rfl: 0   olopatadine  (PATANOL) 0.1 % ophthalmic solution, Place 1 drop into the left eye 2 (two) times daily., Disp: 5 mL, Rfl: 0   oxyCODONE -acetaminophen  (PERCOCET/ROXICET) 5-325 MG tablet, Take 1 tablet by mouth every 8 (eight) hours as needed for up to 12 doses for severe pain (pain score 7-10)., Disp: 12 tablet, Rfl: 0   potassium chloride  (KLOR-CON ) 10 MEQ tablet, Take 1 tablet (10 mEq total) by mouth daily for 12 doses., Disp: 12 tablet, Rfl: 0   Allergies   Allergen Reactions   Penicillins Hives    Childhood allergy Has patient had a PCN reaction causing immediate rash, facial/tongue/throat swelling, SOB or lightheadedness with hypotension: No Has patient had a PCN reaction causing severe rash involving mucus membranes or skin necrosis: No Has patient had a PCN reaction that required hospitalization No Has patient had a PCN reaction occurring within the last 10 years: No If all of the above answers are NO, then may proceed with Cephalosporin use.     Past Medical History:  Diagnosis Date   Chronic hepatitis C virus infection (HCC)    under treatment June 2016   H/O ETOH abuse    Hypertension    Smoker    1/2 ppd     Past Surgical History:  Procedure Laterality Date   COLONOSCOPY     KNEE ARTHROSCOPY     KNEE ARTHROSCOPY WITH MENISCAL REPAIR Right 06/26/2016   Procedure: KNEE ARTHROSCOPY WITH MENISCAL REPAIR VS RESECTION;  Surgeon: Glendia Cordella Hutchinson, MD;  Location: [facility] OR;  Service: Orthopedics;  Laterality: Right;   PATELLAR TENDON REPAIR Right 06/26/2016   Procedure: PATELLA TENDON REPAIR;  Surgeon: Glendia Cordella Hutchinson, MD;  Location: [facility] OR;  Service: Orthopedics;  Laterality: Right;   POLYPECTOMY      Family History  Problem Relation Age of Onset   Heart failure Mother    Heart disease Mother    Heart failure Father    Arrhythmia Brother    Colon cancer Neg Hx    Esophageal cancer Neg Hx    Rectal  cancer Neg Hx    Stomach cancer Neg Hx    Colon polyps Neg Hx    Crohn's disease Neg Hx    Ulcerative colitis Neg Hx     Social History   Tobacco Use   Smoking status: Every Day    Current packs/day: 0.50    Types: Cigarettes    Passive exposure: Never   Smokeless tobacco: Never   Tobacco comments:    cutting back  Vaping Use   Vaping status: Never Used  Substance Use Topics   Alcohol use: Yes    Alcohol/week: 5.0 - 6.0 standard drinks of alcohol    Types: 5 - 6 Cans of beer per week    Comment: pt states  daily beer drinker   Drug use: Not Currently    Types: Crack cocaine    Comment: years ago    ROS Refer to HPI for ROS details.  Objective:    Vitals: BP (!) 146/76 (BP Location: Left Arm)   Pulse (!) 54   Temp 98.2 F (36.8 C) (Oral)   Resp 16   SpO2 95%   Physical Exam Vitals and nursing note reviewed.  Constitutional:      General: He is not in acute distress.    Appearance: Normal appearance. He is well-developed. He is not ill-appearing or toxic-appearing.  HENT:     Head: Normocephalic.     Mouth/Throat:     Mouth: Mucous membranes are moist.  Eyes:     General:        Right eye: No discharge.        Left eye: No discharge.     Extraocular Movements: Extraocular movements intact.     Conjunctiva/sclera: Conjunctivae normal.     Pupils: Pupils are equal, round, and reactive to light.  Cardiovascular:     Rate and Rhythm: Normal rate and regular rhythm.     Heart sounds: Normal heart sounds. No murmur heard. Pulmonary:     Effort: Pulmonary effort is normal. No respiratory distress.     Breath sounds: Normal breath sounds. No stridor. No wheezing, rhonchi or rales.  Chest:     Chest wall: No tenderness.  Skin:    General: Skin is warm and dry.  Neurological:     General: No focal deficit present.     Mental Status: He is alert and oriented to person, place, and time.     Cranial Nerves: No cranial nerve deficit.  Psychiatric:        Mood and Affect: Mood normal.        Behavior: Behavior normal.     Procedures  No results found for this or any previous visit (from the past 24 hours).  Assessment and Plan :     Discharge Instructions       1. Medication refill (Primary) - hydrochlorothiazide  (HYDRODIURIL ) 25 MG tablet; Take 1 tablet (25 mg total) by mouth daily.  Dispense: 30 tablet; Refill: 0 - Follow-up with PCP as scheduled on November 13th for medical evaluation and ongoing prescription medication therapy. -Continue to monitor symptoms  for any change in severity if there is any escalation of current symptoms or development of new symptoms follow-up in ER for further evaluation and management.      Raghav Verrilli B Cheyeanne Roadcap   Kaylene Dawn, [place] B, [place] 08/01/24 1108

## 2024-08-01 NOTE — Discharge Instructions (Signed)
  1. Medication refill (Primary) - hydrochlorothiazide  (HYDRODIURIL ) 25 MG tablet; Take 1 tablet (25 mg total) by mouth daily.  Dispense: 30 tablet; Refill: 0 - Follow-up with PCP as scheduled on November 13th for medical evaluation and ongoing prescription medication therapy. -Continue to monitor symptoms for any change in severity if there is any escalation of current symptoms or development of new symptoms follow-up in ER for further evaluation and management.

## 2024-08-03 ENCOUNTER — Other Ambulatory Visit (INDEPENDENT_AMBULATORY_CARE_PROVIDER_SITE_OTHER): Payer: Self-pay

## 2024-08-03 DIAGNOSIS — Z760 Encounter for issue of repeat prescription: Secondary | ICD-10-CM

## 2024-08-03 NOTE — Telephone Encounter (Signed)
 Patient needed an OV for medication refill.  Was seen in [facility] 08/01/2024.

## 2024-08-10 ENCOUNTER — Ambulatory Visit: Admitting: Gastroenterology

## 2024-08-11 ENCOUNTER — Ambulatory Visit (INDEPENDENT_AMBULATORY_CARE_PROVIDER_SITE_OTHER)

## 2024-08-11 ENCOUNTER — Encounter (INDEPENDENT_AMBULATORY_CARE_PROVIDER_SITE_OTHER): Payer: Self-pay

## 2024-08-11 VITALS — Ht 73.0 in | Wt 171.0 lb

## 2024-08-11 DIAGNOSIS — Z0000 Encounter for general adult medical examination without abnormal findings: Secondary | ICD-10-CM

## 2024-08-11 NOTE — Patient Instructions (Signed)
 Mr. Boccio,  Thank you for taking the time for your Medicare Wellness Visit. I appreciate your continued commitment to your health goals. Please review the care plan we discussed, and feel free to reach out if I can assist you further.  Please note that Annual Wellness Visits do not include a physical exam. Some assessments may be limited, especially if the visit was conducted virtually. If needed, we may recommend an in-person follow-up with your provider.  Ongoing Care Seeing your primary care provider every 3 to 6 months helps us  monitor your health and provide consistent, personalized care.   Referrals If a referral was made during today's visit and you haven't received any updates within two weeks, please contact the referred provider directly to check on the status.  Recommended Screenings:  Health Maintenance  Topic Date Due   Zoster (Shingles) Vaccine (1 of 2) Never done   Colon Cancer Screening  07/21/2023   Flu Shot  04/24/2024   COVID-19 Vaccine (3 - 2025-26 season) 05/25/2024   Medicare Annual Wellness Visit  08/11/2025   DTaP/Tdap/Td vaccine (3 - Td or Tdap) 10/10/2032   Pneumococcal Vaccine for age over 56  Completed   Hepatitis C Screening  Completed   Meningitis B Vaccine  Aged Out       08/11/2024    9:02 AM  Advanced Directives  Does Patient Have a Medical Advance Directive? No  Would patient like information on creating a medical advance directive? Yes (MAU/Ambulatory/Procedural Areas - Information given)   Information on Advanced Care Planning can be found at [place]  Secretary [facility] Advance Health Care Directives Advance Health Care Directives ([email or web address]   Vision: Annual vision screenings are recommended for early detection of glaucoma, cataracts, and diabetic retinopathy. These exams can also reveal signs of chronic conditions such as diabetes and high blood pressure.  Dental: Annual dental screenings help detect early signs of oral cancer, gum  disease, and other conditions linked to overall health, including heart disease and diabetes.  Please see the attached documents for additional preventive care recommendations.

## 2024-08-11 NOTE — Addendum Note (Signed)
 Addended by: LAVELLE PFEIFFER B on: 08/11/2024 09:10 AM   Modules accepted: Level of Service

## 2024-08-11 NOTE — Progress Notes (Cosign Needed Addendum)
 Chief Complaint  Patient presents with   Medicare Wellness     Subjective:   Roy Martinez is a 68 y.o. male who presents for a Medicare Annual Wellness Visit.  Allergies (verified) Penicillins   History: Past Medical History:  Diagnosis Date   Chronic hepatitis C virus infection (HCC)    under treatment June 2016   H/O ETOH abuse    Hypertension    Smoker    1/2 ppd   Past Surgical History:  Procedure Laterality Date   COLONOSCOPY     KNEE ARTHROSCOPY     KNEE ARTHROSCOPY WITH MENISCAL REPAIR Right 06/26/2016   Procedure: KNEE ARTHROSCOPY WITH MENISCAL REPAIR VS RESECTION;  Surgeon: Glendia Cordella Hutchinson, MD;  Location: [facility] OR;  Service: Orthopedics;  Laterality: Right;   PATELLAR TENDON REPAIR Right 06/26/2016   Procedure: PATELLA TENDON REPAIR;  Surgeon: Glendia Cordella Hutchinson, MD;  Location: [facility] OR;  Service: Orthopedics;  Laterality: Right;   POLYPECTOMY     Family History  Problem Relation Age of Onset   Heart failure Mother    Heart disease Mother    Heart failure Father    Arrhythmia Brother    Colon cancer Neg Hx    Esophageal cancer Neg Hx    Rectal cancer Neg Hx    Stomach cancer Neg Hx    Colon polyps Neg Hx    Crohn's disease Neg Hx    Ulcerative colitis Neg Hx    Social History   Occupational History   Not on file  Tobacco Use   Smoking status: Every Day    Current packs/day: 0.50    Types: Cigarettes    Passive exposure: Never   Smokeless tobacco: Never   Tobacco comments:    cutting back  Vaping Use   Vaping status: Never Used  Substance and Sexual Activity   Alcohol use: Yes    Alcohol/week: 5.0 - 6.0 standard drinks of alcohol    Types: 5 - 6 Cans of beer per week    Comment: pt states daily beer drinker   Drug use: Not Currently    Types: Crack cocaine    Comment: years ago   Sexual activity: Not Currently   Tobacco Counseling Ready to quit: Not Answered Counseling given: Not Answered Tobacco comments: cutting back  SDOH  Screenings   Food Insecurity: No Food Insecurity (08/11/2024)  Housing: Low Risk  (08/11/2024)  Transportation Needs: No Transportation Needs (08/11/2024)  Utilities: Not At Risk (08/11/2024)  Depression (PHQ2-9): Low Risk  (08/11/2024)  Physical Activity: Inactive (08/11/2024)  Social Connections: Unknown (08/11/2024)  Stress: No Stress Concern Present (08/11/2024)  Tobacco Use: High Risk (08/11/2024)  Health Literacy: Adequate Health Literacy (08/11/2024)   See flowsheets for full screening details  Depression Screen PHQ 2 & 9 Depression Scale- Over the past 2 weeks, how often have you been bothered by any of the following problems? Little interest or pleasure in doing things: 0 Feeling down, depressed, or hopeless (PHQ Adolescent also includes...irritable): 0 PHQ-2 Total Score: 0     Goals Addressed             This Visit's Progress    Prevent falls       Increase activity        Visit info / Clinical Intake: Medicare Wellness Visit Type:: Initial Annual Wellness Visit Persons participating in visit:: patient Medicare Wellness Visit Mode:: Video Because this visit was a virtual/telehealth visit:: vitals recorded from last visit If Telephone or Video please  confirm:: I connected with the patient using audio enabled telemedicine application and verified that I am speaking with the correct person using two identifiers; I discussed the limitations of evaluation and management by telemedicine; The patient expressed understanding and agreed to proceed Patient Location:: home Provider Location:: home office Information given by:: patient Interpreter Needed?: No Pre-visit prep was completed: yes AWV questionnaire completed by patient prior to visit?: no Living arrangements:: (!) lives alone Patient's Overall Health Status Rating: good Typical amount of pain: some Are you currently prescribed opioids?: no  Dietary Habits and Nutritional Risks How many meals a day?:  2 Eats fruit and vegetables daily?: yes Most meals are obtained by: eating out Diabetic:: no  Functional Status Activities of Daily Living (to include ambulation/medication): Independent Ambulation: Independent Medication Administration: Independent Home Management: Independent Manage your own finances?: yes Primary transportation is: driving Concerns about vision?: no *vision screening is required for WTM* Concerns about hearing?: no  Fall Screening Falls in the past year?: 1 Number of falls in past year: 0 Was there an injury with Fall?: 0 Fall Risk Category Calculator: 1 Patient Fall Risk Level: Low Fall Risk  Fall Risk Patient at Risk for Falls Due to: History of fall(s) Fall risk Follow up: Falls evaluation completed; Education provided; Falls prevention discussed  Home and Transportation Safety: All rugs have non-skid backing?: yes All stairs or steps have railings?: yes Grab bars in the bathtub or shower?: yes Have non-skid surface in bathtub or shower?: yes Good home lighting?: yes Regular seat belt use?: yes Hospital stays in the last year:: no  Cognitive Assessment Difficulty concentrating, remembering, or making decisions? : no Will 6CIT or Mini Cog be Completed: yes What year is it?: 0 points What month is it?: 0 points Give patient an address phrase to remember (5 components): [address] [place] About what time is it?: 0 points Count backwards from 20 to 1: 0 points Say the months of the year in reverse: 2 points Repeat the address phrase from earlier: 0 points 6 CIT Score: 2 points  Advance Directives (For Healthcare) Does Patient Have a Medical Advance Directive?: No Would patient like information on creating a medical advance directive?: Yes (MAU/Ambulatory/Procedural Areas - Information given)  Reviewed/Updated  Reviewed/Updated: Reviewed All (Medical, Surgical, Family, Medications, Allergies, Care Teams, Patient Goals)         Objective:    Today's Vitals   08/11/24 0858  Weight: 171 lb (77.6 kg)  Height: 6' 1 (1.854 m)   Body mass index is 22.56 kg/m.  Current Medications (verified) Outpatient Encounter Medications as of 08/11/2024  Medication Sig   Cholecalciferol (VITAMIN D3) 50 MCG (2000 UT) capsule Take 1 capsule (2,000 Units total) by mouth daily.   Ferrous Sulfate  90 (18 Fe) MG TABS Take 1 tablet by mouth daily.   hydrochlorothiazide  (HYDRODIURIL ) 25 MG tablet Take 1 tablet (25 mg total) by mouth daily.   baclofen  (LIORESAL ) 10 MG tablet Take 1 tablet (10 mg total) by mouth 3 (three) times daily. (Patient not taking: Reported on 08/11/2024)   meloxicam  (MOBIC ) 15 MG tablet TAKE 1 TABLET(15 MG) BY MOUTH DAILY (Patient not taking: Reported on 08/11/2024)   methylPREDNISolone  (MEDROL  DOSEPAK) 4 MG TBPK tablet Use as directed (Patient not taking: Reported on 08/11/2024)   olopatadine  (PATANOL) 0.1 % ophthalmic solution Place 1 drop into the left eye 2 (two) times daily. (Patient not taking: Reported on 08/11/2024)   oxyCODONE -acetaminophen  (PERCOCET/ROXICET) 5-325 MG tablet Take 1 tablet by mouth  every 8 (eight) hours as needed for up to 12 doses for severe pain (pain score 7-10). (Patient not taking: Reported on 08/11/2024)   potassium chloride  (KLOR-CON ) 10 MEQ tablet Take 1 tablet (10 mEq total) by mouth daily for 12 doses. (Patient not taking: Reported on 08/11/2024)   No facility-administered encounter medications on file as of 08/11/2024.   Hearing/Vision screen Hearing Screening - Comments:: Patient is able to hear conversational tones without difficulty. No issues reported.   Vision Screening - Comments:: No vision problems; will schedule routine eye exam Immunizations and Health Maintenance Health Maintenance  Topic Date Due   Zoster Vaccines- Shingrix (1 of 2) Never done   Colonoscopy  07/21/2023   Influenza Vaccine  04/24/2024   COVID-19 Vaccine (3 - 2025-26 season) 05/25/2024    Medicare Annual Wellness (AWV)  08/11/2025   DTaP/Tdap/Td (3 - Td or Tdap) 10/10/2032   Pneumococcal Vaccine: 50+ Years  Completed   Hepatitis C Screening  Completed   Meningococcal B Vaccine  Aged Out        Assessment/Plan:  This is a routine wellness examination for Macrae.  Patient Care Team: Celestia Rosaline SQUIBB, NP as PCP - General (Internal Medicine)  I have personally reviewed and noted the following in the patient's chart:   Medical and social history Use of alcohol, tobacco or illicit drugs  Current medications and supplements including opioid prescriptions. Functional ability and status Nutritional status Physical activity Advanced directives List of other physicians Hospitalizations, surgeries, and ER visits in previous 12 months Vitals Screenings to include cognitive, depression, and falls Referrals and appointments  No orders of the defined types were placed in this encounter.  In addition, I have reviewed and discussed with patient certain preventive protocols, quality metrics, and best practice recommendations. A written personalized care plan for preventive services as well as general preventive health recommendations were provided to patient.   Lavelle Charmaine Browner, LPN   [id number]/81/7974   Return in 1 year (on 08/11/2025).  After Visit Summary: (Mail) Due to this being a telephonic visit, the after visit summary with patients personalized plan was offered to patient via mail   Nurse Notes: Patient has upcoming appointment on 08/26/24- he would like to discuss knot on forehead and problem with feet feeling cold

## 2024-08-24 ENCOUNTER — Telehealth (INDEPENDENT_AMBULATORY_CARE_PROVIDER_SITE_OTHER): Payer: Self-pay | Admitting: Primary Care

## 2024-08-24 NOTE — Telephone Encounter (Signed)
 Called pt to reschedule appt bc pt scheduled for a day the provider will not be in office. Please advise and reschedule if pt call back.

## 2024-08-25 ENCOUNTER — Encounter (INDEPENDENT_AMBULATORY_CARE_PROVIDER_SITE_OTHER): Payer: Self-pay | Admitting: Primary Care

## 2024-08-25 ENCOUNTER — Telehealth (INDEPENDENT_AMBULATORY_CARE_PROVIDER_SITE_OTHER): Payer: Self-pay

## 2024-08-25 ENCOUNTER — Ambulatory Visit (INDEPENDENT_AMBULATORY_CARE_PROVIDER_SITE_OTHER): Admitting: Primary Care

## 2024-08-25 VITALS — BP 119/76 | HR 50 | Resp 16 | Ht 73.0 in | Wt 169.0 lb

## 2024-08-25 DIAGNOSIS — H579 Unspecified disorder of eye and adnexa: Secondary | ICD-10-CM | POA: Diagnosis not present

## 2024-08-25 DIAGNOSIS — Z760 Encounter for issue of repeat prescription: Secondary | ICD-10-CM | POA: Diagnosis not present

## 2024-08-25 DIAGNOSIS — I10 Essential (primary) hypertension: Secondary | ICD-10-CM | POA: Diagnosis not present

## 2024-08-25 MED ORDER — OLOPATADINE HCL 0.1 % OP SOLN: 1.0000 [drp] | Freq: Two times a day (BID) | OPHTHALMIC | 0 refills | Status: AC

## 2024-08-25 MED ORDER — HYDROCHLOROTHIAZIDE 25 MG PO TABS: 25.0000 mg | ORAL_TABLET | Freq: Every day | ORAL | 1 refills | Status: AC

## 2024-08-25 NOTE — Telephone Encounter (Signed)
 There was an encounter from 08-24-24 at 4:19. Here is what I said on this encounter from 12-1. Called pt to reschedule appt bc pt scheduled for a day the provider will not be in office. Please advise and reschedule if pt call back.

## 2024-08-25 NOTE — Progress Notes (Signed)
 [facility]   Roy Martinez is a 68 y.o. male presents for hypertension evaluation, Denies shortness of breath, headaches, chest pain or lower extremity edema, sudden onset, vision changes, unilateral weakness, dizziness, paresthesias  Bp well controlled. Patient reports that his neck hurts ( unsure if neck hurts the way he sleep) - non specific and sometimes he loses his balance and trips  IDK  Drinks beer never qualified. Loss of appetite for BKF.  Patient if problem came from washing 3 cars in the cold weather probable.  Weight loss of 3 lbs 1 month - Needs Bp refills Patient reports adherence with medications.  Past Medical History:  Diagnosis Date   Chronic hepatitis C virus infection (HCC)    under treatment June 2016   H/O ETOH abuse    Hypertension    Smoker    1/2 ppd   Past Surgical History:  Procedure Laterality Date   COLONOSCOPY     KNEE ARTHROSCOPY     KNEE ARTHROSCOPY WITH MENISCAL REPAIR Right 06/26/2016   Procedure: KNEE ARTHROSCOPY WITH MENISCAL REPAIR VS RESECTION;  Surgeon: Glendia Cordella Hutchinson, MD;  Location: [facility] OR;  Service: Orthopedics;  Laterality: Right;   PATELLAR TENDON REPAIR Right 06/26/2016   Procedure: PATELLA TENDON REPAIR;  Surgeon: Glendia Cordella Hutchinson, MD;  Location: [facility] OR;  Service: Orthopedics;  Laterality: Right;   POLYPECTOMY     Allergies  Allergen Reactions   Penicillins Hives    Childhood allergy Has patient had a PCN reaction causing immediate rash, facial/tongue/throat swelling, SOB or lightheadedness with hypotension: No Has patient had a PCN reaction causing severe rash involving mucus membranes or skin necrosis: No Has patient had a PCN reaction that required hospitalization No Has patient had a PCN reaction occurring within the last 10 years: No If all of the above answers are NO, then may proceed with Cephalosporin use.    Current Outpatient Medications on File Prior to Visit  Medication Sig Dispense Refill    baclofen  (LIORESAL ) 10 MG tablet Take 1 tablet (10 mg total) by mouth 3 (three) times daily. (Patient not taking: Reported on 08/11/2024) 30 each 0   Cholecalciferol (VITAMIN D3) 50 MCG (2000 UT) capsule Take 1 capsule (2,000 Units total) by mouth daily. 90 capsule 1   Ferrous Sulfate  90 (18 Fe) MG TABS Take 1 tablet by mouth daily. 30 tablet 0   hydrochlorothiazide  (HYDRODIURIL ) 25 MG tablet Take 1 tablet (25 mg total) by mouth daily. 30 tablet 0   meloxicam  (MOBIC ) 15 MG tablet TAKE 1 TABLET(15 MG) BY MOUTH DAILY (Patient not taking: Reported on 08/11/2024) 90 tablet 0   methylPREDNISolone  (MEDROL  DOSEPAK) 4 MG TBPK tablet Use as directed (Patient not taking: Reported on 08/11/2024) 21 tablet 0   olopatadine  (PATANOL) 0.1 % ophthalmic solution Place 1 drop into the left eye 2 (two) times daily. (Patient not taking: Reported on 08/11/2024) 5 mL 0   oxyCODONE -acetaminophen  (PERCOCET/ROXICET) 5-325 MG tablet Take 1 tablet by mouth every 8 (eight) hours as needed for up to 12 doses for severe pain (pain score 7-10). (Patient not taking: Reported on 08/11/2024) 12 tablet 0   potassium chloride  (KLOR-CON ) 10 MEQ tablet Take 1 tablet (10 mEq total) by mouth daily for 12 doses. (Patient not taking: Reported on 08/11/2024) 12 tablet 0   No current facility-administered medications on file prior to visit.   Social History   Socioeconomic History   Marital status: Single    Spouse name: Not on file   Number  of children: Not on file   Years of education: Not on file   Highest education level: Not on file  Occupational History   Not on file  Tobacco Use   Smoking status: Every Day    Current packs/day: 0.50    Types: Cigarettes    Passive exposure: Never   Smokeless tobacco: Never   Tobacco comments:    cutting back  Vaping Use   Vaping status: Never Used  Substance and Sexual Activity   Alcohol use: Yes    Alcohol/week: 5.0 - 6.0 standard drinks of alcohol    Types: 5 - 6 Cans of beer per  week    Comment: pt states daily beer drinker   Drug use: Not Currently    Types: Crack cocaine    Comment: years ago   Sexual activity: Not Currently  Other Topics Concern   Not on file  Social History Narrative   ** Merged History Encounter **       Social Drivers of Health   Financial Resource Strain: Not on file  Food Insecurity: No Food Insecurity (08/11/2024)   Hunger Vital Sign    Worried About Running Out of Food in the Last Year: Never true    Ran Out of Food in the Last Year: Never true  Transportation Needs: No Transportation Needs (08/11/2024)   PRAPARE - Administrator, Civil Service (Medical): No    Lack of Transportation (Non-Medical): No  Physical Activity: Inactive (08/11/2024)   Exercise Vital Sign    Days of Exercise per Week: 0 days    Minutes of Exercise per Session: 0 min  Stress: No Stress Concern Present (08/11/2024)   [employer] of Occupational Health - Occupational Stress Questionnaire    Feeling of Stress: Not at all  Social Connections: Unknown (08/11/2024)   Social Connection and Isolation Panel    Frequency of Communication with Friends and Family: More than three times a week    Frequency of Social Gatherings with Friends and Family: Once a week    Attends Religious Services: 1 to 4 times per year    Active Member of [employer] or Organizations: No    Attends Banker Meetings: Never    Marital Status: Patient declined  Catering Manager Violence: Not At Risk (08/11/2024)   Humiliation, Afraid, Rape, and Kick questionnaire    Fear of Current or Ex-Partner: No    Emotionally Abused: No    Physically Abused: No    Sexually Abused: No   Family History  Problem Relation Age of Onset   Heart failure Mother    Heart disease Mother    Heart failure Father    Arrhythmia Brother    Colon cancer Neg Hx    Esophageal cancer Neg Hx    Rectal cancer Neg Hx    Stomach cancer Neg Hx    Colon polyps Neg Hx    Crohn's  disease Neg Hx    Ulcerative colitis Neg Hx    Health Maintenance  Topic Date Due   Zoster Vaccines- Shingrix (1 of 2) Never done   Colonoscopy  07/21/2023   Influenza Vaccine  04/24/2024   COVID-19 Vaccine (3 - 2025-26 season) 05/25/2024   Medicare Annual Wellness (AWV)  08/11/2025   DTaP/Tdap/Td (3 - Td or Tdap) 10/10/2032   Pneumococcal Vaccine: 50+ Years  Completed   Hepatitis C Screening  Completed   Meningococcal B Vaccine  Aged Out     OBJECTIVE:  Vitals:  08/25/24 1509  BP: 119/76  Pulse: (!) 50  Resp: 16  SpO2: 100%  Weight: 169 lb (76.7 kg)  Height: 6' 1 (1.854 m)    Physical Exam Vitals reviewed.  Constitutional:      Appearance: Normal appearance.     Comments: Groggy   HENT:     Head: Normocephalic.     Right Ear: Tympanic membrane and external ear normal.     Left Ear: Tympanic membrane and external ear normal.     Nose: Nose normal.  Eyes:     Pupils: Pupils are equal, round, and reactive to light.     Comments: Right eye red itching rubbing eye  Cardiovascular:     Rate and Rhythm: Normal rate and regular rhythm.  Pulmonary:     Effort: Pulmonary effort is normal.     Breath sounds: Normal breath sounds.  Abdominal:     General: Bowel sounds are normal. There is distension.     Palpations: Abdomen is soft.  Musculoskeletal:        General: Normal range of motion.     Cervical back: Normal range of motion.  Skin:    General: Skin is warm and dry.  Neurological:     Mental Status: He is oriented to person, place, and time.  Psychiatric:        Mood and Affect: Mood normal.        Behavior: Behavior normal.        Thought Content: Thought content normal.        Judgment: Judgment normal.      ROS  Last 3 Office BP readings: BP Readings from Last 3 Encounters:  08/25/24 119/76  08/01/24 (!) 146/76  06/25/24 122/70    BMET    Component Value Date/Time   NA 138 06/25/2024 1058   NA 137 06/18/2024 1155   K 4.4 06/25/2024 1058    CL 102 06/25/2024 1058   CO2 28 06/25/2024 1058   GLUCOSE 101 (H) 06/25/2024 1058   BUN 15 06/25/2024 1058   BUN 14 06/18/2024 1155   CREATININE 0.67 06/25/2024 1058   CREATININE 0.72 03/11/2017 0953   CALCIUM 9.0 06/25/2024 1058   GFRNONAA >60 06/04/2024 1006   GFRNONAA >89 03/11/2017 0953   GFRAA 113 05/31/2020 1128   GFRAA >89 03/11/2017 0953    Renal function: CrCl cannot be calculated (Patient's most recent lab result is older than the maximum 21 days allowed.).  Clinical ASCVD: Yes  The 10-year ASCVD risk score (Arnett DK, et al., 2019) is: 22.3%   Values used to calculate the score:     Age: 31 years     Clincally relevant sex: Male     Is Non-Hispanic African American: Yes     Diabetic: No     Tobacco smoker: Yes     Systolic Blood Pressure: 119 mmHg     Is BP treated: Yes     HDL Cholesterol: 61 mg/dL     Total Cholesterol: 164 mg/dL  ASCVD risk factors include- CHAD   ASSESSMENT & PLAN: Jahmari was seen today for hypertension.  Diagnoses and all orders for this visit: Riggs was seen today for hypertension.  Diagnoses and all orders for this visit:  Essential hypertension Well controlled Reduce  risk for cardiovascular events, stroke and heart attack. Also counseled patient about the importance of medication adherence. If you participate in smoking, it is important to stop using tobacco as this will increase the risks associated with uncontrolled  blood pressure.   Itchy eyes olopatadine  (PATANOL) 0.1 % ophthalmic solution   Medication refill -     hydrochlorothiazide  (HYDRODIURIL ) 25 MG tablet; Take 1 tablet (25 mg total) by mouth daily. -   This note has been created with Education officer, environmental. Any transcriptional errors are unintentional.   Rosaline SHAUNNA Bohr, NP 08/25/2024, 3:33 PM

## 2024-08-25 NOTE — Telephone Encounter (Signed)
 Copied from CRM #[id number]. Topic: Appointments - Appointment Scheduling >> Aug 25, 2024  1:00 PM Sophia H wrote: Patient/patient representative is calling to schedule an appointment. Refer to attachments for appointment information.    Patient upset - stated clinic should've called instead of texting him regarding appointment and provider being out of office. Patient states he cannot wait till December 26th for an appointment especially given the situation with his BP meds. Patient stated he can't keep going to ER to get refills on meds, would like to speak with clinic directly regarding scheduling. Can come in today if anything is available. # [phone]

## 2024-08-26 ENCOUNTER — Ambulatory Visit (INDEPENDENT_AMBULATORY_CARE_PROVIDER_SITE_OTHER): Admitting: Primary Care

## 2024-08-27 NOTE — Telephone Encounter (Signed)
 noted

## 2024-08-29 ENCOUNTER — Other Ambulatory Visit: Payer: Self-pay

## 2024-08-29 ENCOUNTER — Encounter (HOSPITAL_COMMUNITY): Payer: Self-pay | Admitting: *Deleted

## 2024-08-29 ENCOUNTER — Ambulatory Visit (HOSPITAL_COMMUNITY)
Admission: EM | Admit: 2024-08-29 | Discharge: 2024-08-29 | Disposition: A | Discharge status: Home / Self Care | Attending: Emergency Medicine | Admitting: Emergency Medicine

## 2024-08-29 DIAGNOSIS — H5712 Ocular pain, left eye: Secondary | ICD-10-CM

## 2024-08-29 DIAGNOSIS — G44209 Tension-type headache, unspecified, not intractable: Secondary | ICD-10-CM

## 2024-08-29 MED ORDER — TETRACAINE HCL 0.5 % OP SOLN
OPHTHALMIC | Status: AC
  Filled 2024-08-29: qty 4

## 2024-08-29 MED ORDER — CYCLOBENZAPRINE HCL 5 MG PO TABS: 5.0000 mg | ORAL_TABLET | Freq: Two times a day (BID) | ORAL | 0 refills | Status: AC | PRN

## 2024-08-29 MED ORDER — IBUPROFEN 600 MG PO TABS: 600.0000 mg | ORAL_TABLET | Freq: Three times a day (TID) | ORAL | 0 refills | Status: AC | PRN

## 2024-08-29 NOTE — Discharge Instructions (Signed)
 Call the eye doctor listed below on Monday morning and tell them you were seen in urgent care on Saturday, that you have been having left eye pain for awhile, and that we said you needed to see an ophthalmologist.   Between now and when you see the eye doctor, if your eye become extremely painful, please go to the ER. The ER can get eye doctors to come see people in the ER with eye emergencies.   For your head pain that goes to your neck, I think it is a muscle pain. I have prescribed ibuprofen  for you to take. I have also prescribed a muscle relaxer, cyclobenzaprine . The problem with muscle relaxers is they make people feel very sleepy. You should not take it if you need to work or drive, and be very careful moving around if you get up in the middle of the night. If it makes you too sleepy, do not take it.

## 2024-08-29 NOTE — ED Provider Notes (Signed)
 [facility]    CSN: [id number] Arrival date & time: 08/29/24  1651      History   Chief Complaint Chief Complaint  Patient presents with   Headache    HPI Roy Martinez is a 68 y.o. male. Pt with L sided head pain for about a week. No injury. Has not tried to treat it at home. His cousin told him he might be having a stroke and he had to go to ER, so he came here.   He also c/o L eye pain for about a year. Has not seen an eye doctor in 2 years, wears glasses. Denies vision change, no red eye.    Headache   Past Medical History:  Diagnosis Date   Chronic hepatitis C virus infection (HCC)    under treatment June 2016   H/O ETOH abuse    Hypertension    Smoker    1/2 ppd    Patient Active Problem List   Diagnosis Date Noted   Smoker 03/28/2017   Irregular heart beat 03/28/2017   Abnormal EKG 03/28/2017   ETOH abuse 03/28/2017   Chronic hepatitis C without hepatic coma (HCC) 03/11/2017   Patellar tendon rupture, right, initial encounter 06/26/2016    Past Surgical History:  Procedure Laterality Date   COLONOSCOPY     KNEE ARTHROSCOPY     KNEE ARTHROSCOPY WITH MENISCAL REPAIR Right 06/26/2016   Procedure: KNEE ARTHROSCOPY WITH MENISCAL REPAIR VS RESECTION;  Surgeon: Glendia Cordella Hutchinson, MD;  Location: [facility] OR;  Service: Orthopedics;  Laterality: Right;   PATELLAR TENDON REPAIR Right 06/26/2016   Procedure: PATELLA TENDON REPAIR;  Surgeon: Glendia Cordella Hutchinson, MD;  Location: [facility] OR;  Service: Orthopedics;  Laterality: Right;   POLYPECTOMY         Home Medications    Prior to Admission medications   Medication Sig Start Date End Date Taking? Authorizing Provider  cyclobenzaprine  (FLEXERIL ) 5 MG tablet Take 1 tablet (5 mg total) by mouth 2 (two) times daily as needed for muscle spasms. 08/29/24  Yes Richad Jon HERO, NP  hydrochlorothiazide  (HYDRODIURIL ) 25 MG tablet Take 1 tablet (25 mg total) by mouth daily. 08/25/24  Yes Celestia Rosaline SQUIBB, NP  ibuprofen   (ADVIL ) 600 MG tablet Take 1 tablet (600 mg total) by mouth every 8 (eight) hours as needed. 08/29/24  Yes Richad Jon HERO, NP  olopatadine  (PATANOL) 0.1 % ophthalmic solution Place 1 drop into the left eye 2 (two) times daily. 08/25/24  Yes Celestia Rosaline SQUIBB, NP  Ferrous Sulfate  90 (18 Fe) MG TABS Take 1 tablet by mouth daily. 06/19/24   Oley Bascom RAMAN, NP    Family History Family History  Problem Relation Age of Onset   Heart failure Mother    Heart disease Mother    Heart failure Father    Arrhythmia Brother    Colon cancer Neg Hx    Esophageal cancer Neg Hx    Rectal cancer Neg Hx    Stomach cancer Neg Hx    Colon polyps Neg Hx    Crohn's disease Neg Hx    Ulcerative colitis Neg Hx     Social History Social History   Tobacco Use   Smoking status: Every Day    Current packs/day: 0.50    Types: Cigarettes    Passive exposure: Never   Smokeless tobacco: Never   Tobacco comments:    cutting back  Vaping Use   Vaping status: Never Used  Substance Use Topics  Alcohol use: Yes    Alcohol/week: 5.0 - 6.0 standard drinks of alcohol    Types: 5 - 6 Cans of beer per week    Comment: pt states daily beer drinker   Drug use: Not Currently    Types: Crack cocaine    Comment: years ago     Allergies   Penicillins   Review of Systems Review of Systems  Neurological:  Positive for headaches.     Physical Exam Triage Vital Signs ED Triage Vitals  Encounter Vitals Group     BP 08/29/24 1712 (!) 140/74     Girls Systolic BP Percentile --      Girls Diastolic BP Percentile --      Boys Systolic BP Percentile --      Boys Diastolic BP Percentile --      Pulse Rate 08/29/24 1712 (!) 58     Resp 08/29/24 1712 18     Temp 08/29/24 1712 97.9 F (36.6 C)     Temp src --      SpO2 08/29/24 1712 95 %     Weight --      Height --      Head Circumference --      Peak Flow --      Pain Score 08/29/24 1709 4     Pain Loc --      Pain Education --      Exclude from  Growth Chart --    No data found.  Updated Vital Signs BP (!) 140/74   Pulse (!) 58   Temp 97.9 F (36.6 C)   Resp 18   SpO2 95%   Visual Acuity Right Eye Distance: 20/20 -1 with eyeglasses Left Eye Distance: 20/25 -1 with eyeglasses Bilateral Distance: 20/25 with eyeglasses  Right Eye Near:   Left Eye Near:    Bilateral Near:     Physical Exam Constitutional:      General: He is not in acute distress.    Appearance: He is well-developed.  Eyes:     Extraocular Movements: Extraocular movements intact.     Conjunctiva/sclera: Conjunctivae normal.     Pupils: Pupils are equal, round, and reactive to light.  Neck:   Pulmonary:     Effort: Pulmonary effort is normal.  Musculoskeletal:     Cervical back: Normal range of motion and neck supple. No rigidity. Muscular tenderness present. Normal range of motion.  Neurological:     Mental Status: He is alert.     Cranial Nerves: No facial asymmetry.     Motor: Motor function is intact.     Gait: Gait normal.      UC Treatments / Results  Labs (all labs ordered are listed, but only abnormal results are displayed) Labs Reviewed - No data to display  EKG   Radiology No results found.  Procedures Procedures (including critical care time)  Medications Ordered in UC Medications - No data to display  Initial Impression / Assessment and Plan / UC Course  I have reviewed the triage vital signs and the nursing notes.  Pertinent labs & imaging results that were available during my care of the patient were reviewed by me and considered in my medical decision making (see chart for details).     I think eye c/o and head/neck pain are separate problems. I am not worried about acute angle closure glaucoma as sx are long term. I do think he could have open angle glaucoma. I attempted to use  tonopen in urgent care but it was not functioning properly. I think he needs prompt f/u with ophthalmology and instructed him to seek ER  care if eye pain becomes severe between now and when he can get eye dr appt.   For head/neck pain, I think it is a muscle pain for the last few days. Rx ibuprofen  and flexeril , I discussed side effects of flexeril  and to not take it if is too sedating.  Final Clinical Impressions(s) / UC Diagnoses   Final diagnoses:  Pain of left eye  Tension headache     Discharge Instructions      Call the eye doctor listed below on Monday morning and tell them you were seen in urgent care on Saturday, that you have been having left eye pain for awhile, and that we said you needed to see an ophthalmologist.   Between now and when you see the eye doctor, if your eye become extremely painful, please go to the ER. The ER can get eye doctors to come see people in the ER with eye emergencies.   For your head pain that goes to your neck, I think it is a muscle pain. I have prescribed ibuprofen  for you to take. I have also prescribed a muscle relaxer, cyclobenzaprine . The problem with muscle relaxers is they make people feel very sleepy. You should not take it if you need to work or drive, and be very careful moving around if you get up in the middle of the night. If it makes you too sleepy, do not take it.      ED Prescriptions     Medication Sig Dispense Auth. Provider   ibuprofen  (ADVIL ) 600 MG tablet Take 1 tablet (600 mg total) by mouth every 8 (eight) hours as needed. 21 tablet Richad Jon HERO, NP   cyclobenzaprine  (FLEXERIL ) 5 MG tablet Take 1 tablet (5 mg total) by mouth 2 (two) times daily as needed for muscle spasms. 21 tablet Richad Jon HERO, NP      PDMP not reviewed this encounter.   Richad Jon HERO, NP 08/29/24 [phone]

## 2024-08-29 NOTE — ED Triage Notes (Signed)
 Pt reports the Lt side of his head hurts and behind the Lt eye . Pt denies any changes in vision. PT reports he has not taked tylenol  or ibuprofen  for HA.

## 2024-09-02 ENCOUNTER — Telehealth: Payer: Self-pay | Admitting: Gastroenterology

## 2024-09-02 ENCOUNTER — Ambulatory Visit: Attending: Cardiology | Admitting: Cardiology

## 2024-09-02 ENCOUNTER — Encounter: Payer: Self-pay | Admitting: Cardiology

## 2024-09-02 VITALS — BP 128/70 | HR 62 | Resp 17 | Ht 73.0 in | Wt 170.0 lb

## 2024-09-02 DIAGNOSIS — I700 Atherosclerosis of aorta: Secondary | ICD-10-CM | POA: Diagnosis not present

## 2024-09-02 DIAGNOSIS — I10 Essential (primary) hypertension: Secondary | ICD-10-CM | POA: Diagnosis not present

## 2024-09-02 DIAGNOSIS — Z860100 Personal history of colon polyps, unspecified: Secondary | ICD-10-CM

## 2024-09-02 DIAGNOSIS — E785 Hyperlipidemia, unspecified: Secondary | ICD-10-CM | POA: Diagnosis not present

## 2024-09-02 DIAGNOSIS — F17210 Nicotine dependence, cigarettes, uncomplicated: Secondary | ICD-10-CM

## 2024-09-02 MED ORDER — NICOTINE POLACRILEX 2 MG MT LOZG: 2.0000 mg | LOZENGE | OROMUCOSAL | 0 refills | Status: AC | PRN

## 2024-09-02 MED ORDER — NICOTINE 14 MG/24HR TD PT24: 14.0000 mg | MEDICATED_PATCH | Freq: Every day | TRANSDERMAL | 1 refills | Status: AC

## 2024-09-02 MED ORDER — ROSUVASTATIN CALCIUM 10 MG PO TABS: 10.0000 mg | ORAL_TABLET | Freq: Every day | ORAL | 3 refills | Status: AC

## 2024-09-02 NOTE — Patient Instructions (Signed)
 Medication Instructions:   START rosuvastatin (crestor) 10mg  daily for cholesterol   START nicotine patch  START nicotine lozenges  *If you need a refill on your cardiac medications before your next appointment, please call your pharmacy*  Lab Work:  FASTING lab work in 6 weeks ~ Oct 14, 2024 ** no appointment needed ** lab on 1st floor of Dr. Tyree office  If you have labs (blood work) drawn today and your tests are completely normal, you will receive your results only by: MyChart Message (if you have MyChart) OR A paper copy in the mail If you have any lab test that is abnormal or we need to change your treatment, we will call you to review the results.  Testing/Procedures: Your physician has requested that you have an echocardiogram. Echocardiography is a painless test that uses sound waves to create images of your heart. It provides your doctor with information about the size and shape of your heart and how well your hearts chambers and valves are working. This procedure takes approximately one hour. There are no restrictions for this procedure. Please do NOT wear cologne, perfume, aftershave, or lotions (deodorant is allowed). Please arrive 15 minutes prior to your appointment time.  Please note: We ask at that you not bring children with you during ultrasound (echo/ vascular) testing. Due to room size and safety concerns, children are not allowed in the ultrasound rooms during exams. Our front office staff cannot provide observation of children in our lobby area while testing is being conducted. An adult accompanying a patient to their appointment will only be allowed in the ultrasound room at the discretion of the ultrasound technician under special circumstances. We apologize for any inconvenience.  Dr. Michele has ordered a CT coronary calcium score.   Test locations:  [facility] at [facility]  [place]  [facility]  This is $99 out of pocket.   Coronary CalciumScan A coronary calcium scan is an imaging test used to look for deposits of calcium and other fatty materials (plaques) in the inner lining of the blood vessels of the heart (coronary arteries). These deposits of calcium and plaques can partly clog and narrow the coronary arteries without producing any symptoms or warning signs. This puts a person at risk for a heart attack. This test can detect these deposits before symptoms develop. Tell a health care provider about: Any allergies you have. All medicines you are taking, including vitamins, herbs, eye drops, creams, and over-the-counter medicines. Any problems you or family members have had with anesthetic medicines. Any blood disorders you have. Any surgeries you have had. Any medical conditions you have. Whether you are pregnant or may be pregnant. What are the risks? Generally, this is a safe procedure. However, problems may occur, including: Harm to a pregnant woman and her unborn baby. This test involves the use of radiation. Radiation exposure can be dangerous to a pregnant woman and her unborn baby. If you are pregnant, you generally should not have this procedure done. Slight increase in the risk of cancer. This is because of the radiation involved in the test. What happens before the procedure? No preparation is needed for this procedure. What happens during the procedure? You will undress and remove any jewelry around your neck or chest. You will put on a hospital gown. Sticky electrodes will be placed on your chest. The electrodes will be connected to an electrocardiogram (ECG) machine  to record a tracing of the electrical activity of your heart. A CT scanner will take pictures of your heart. During this time, you will be asked to lie still and hold your breath for 2-3 seconds while a picture of your heart is being taken. The  procedure may vary among health care providers and hospitals. What happens after the procedure? You can get dressed. You can return to your normal activities. It is up to you to get the results of your test. Ask your health care provider, or the department that is doing the test, when your results will be ready. Summary A coronary calcium scan is an imaging test used to look for deposits of calcium and other fatty materials (plaques) in the inner lining of the blood vessels of the heart (coronary arteries). Generally, this is a safe procedure. Tell your health care provider if you are pregnant or may be pregnant. No preparation is needed for this procedure. A CT scanner will take pictures of your heart. You can return to your normal activities after the scan is done. This information is not intended to replace advice given to you by your health care provider. Make sure you discuss any questions you have with your health care provider. Document Released: 03/08/2008 Document Revised: 07/30/2016 Document Reviewed: 07/30/2016 Elsevier Interactive Patient Education  2017 [employer].   Follow-Up: At [facility], you and your health needs are our priority.  As part of our continuing mission to provide you with exceptional heart care, our providers are all part of one team.  This team includes your primary Cardiologist (physician) and Advanced Practice Providers or APPs (Physician Assistants and Nurse Practitioners) who all work together to provide you with the care you need, when you need it.  Your next appointment:    12 months with Dr. Michele  We recommend signing up for the patient portal called MyChart.  Sign up information is provided on this After Visit Summary.  MyChart is used to connect with patients for Virtual Visits (Telemedicine).  Patients are able to view lab/test results, encounter notes, upcoming appointments, etc.  Non-urgent messages can be sent to your provider as  well.   To learn more about what you can do with MyChart, go to [email or web address].   Other Instructions

## 2024-09-02 NOTE — Telephone Encounter (Signed)
 Called patient back and got his voicemail. Explained that he needed to buy the 4 dulcolax asap and take those, also gave detailed prep instructions with times that he needs to take his prep tonight and tomorrow.

## 2024-09-02 NOTE — Telephone Encounter (Signed)
 Inbound call from patient stating that he is scheduled to have a colonoscopy tomorrow 12/11 at 9;30. He called to discuss when he needed to take his prep medication, after reviewing his chart he is on this split prep and should have taken 4 Dulcolax tablets at 3. I advised patient and he stated that he did not have the pills to take. Patient is requesting a call back to further discuss. Please advise.

## 2024-09-02 NOTE — Progress Notes (Signed)
 Cardiology Office Note:    NAME:  Roy Martinez    MRN: [id number] DOB:  1955/11/21   PCP:  Celestia Rosaline SQUIBB, NP  Former Cardiology Providers: Dr. Mona Primary Cardiologist:  None, [facility] (established care 09/02/2024) Electrophysiologist:  None   Referring MD: Oley Bascom RAMAN, NP  Reason of Consult: Aortic atherosclerosis  Chief Complaint  Patient presents with   New Patient (Initial Visit)    Aortic atherosclerosis    History of Present Illness:    Roy Martinez is a 68 y.o. African-American male whose past medical history and cardiovascular risk factors includes: Chronic hepatitis C infection status posttreatment 2016, history of EtOH abuse, hypertension, cigarette smoker.   He is being seen today for the evaluation of aortic atherosclerosis at the request of Oley Bascom RAMAN, NP.  Patient was referred to the practice for evaluation of aortic atherosclerosis after undergoing a CT scan in the emergency room department back in September 2025.  Patient does not know why he is here today - history is limited.  Denies anginal chest pain or heart failure symptoms. No prior history of myocardial infarction, CAD, prior coronary interventions, DVT or PE per patient. He smokes approximately 0.5 packs/day. Patient has never tried to quit with nicotine  patches or other pharmacological therapy but is motivated.  Current Medications: Current Meds  Medication Sig   cyclobenzaprine  (FLEXERIL ) 5 MG tablet Take 1 tablet (5 mg total) by mouth 2 (two) times daily as needed for muscle spasms.   Ferrous Sulfate  90 (18 Fe) MG TABS Take 1 tablet by mouth daily.   hydrochlorothiazide  (HYDRODIURIL ) 25 MG tablet Take 1 tablet (25 mg total) by mouth daily.   ibuprofen  (ADVIL ) 600 MG tablet Take 1 tablet (600 mg total) by mouth every 8 (eight) hours as needed.   nicotine  (NICODERM CQ  - DOSED IN MG/24 HOURS) 14 mg/24hr patch Place 1 patch (14 mg total) onto the skin daily.   nicotine  polacrilex  (NICORETTE ) 2 MG lozenge Take 1 lozenge (2 mg total) by mouth as needed for smoking cessation.   olopatadine  (PATANOL) 0.1 % ophthalmic solution Place 1 drop into the left eye 2 (two) times daily.   rosuvastatin  (CRESTOR ) 10 MG tablet Take 1 tablet (10 mg total) by mouth at bedtime.     Allergies:    Penicillins   Past Medical History: Past Medical History:  Diagnosis Date   Chronic hepatitis C virus infection (HCC)    under treatment June 2016   H/O ETOH abuse    Hypertension    Smoker    1/2 ppd    Past Surgical History: Past Surgical History:  Procedure Laterality Date   COLONOSCOPY     KNEE ARTHROSCOPY     KNEE ARTHROSCOPY WITH MENISCAL REPAIR Right 06/26/2016   Procedure: KNEE ARTHROSCOPY WITH MENISCAL REPAIR VS RESECTION;  Surgeon: Glendia Cordella Hutchinson, MD;  Location: [facility] OR;  Service: Orthopedics;  Laterality: Right;   PATELLAR TENDON REPAIR Right 06/26/2016   Procedure: PATELLA TENDON REPAIR;  Surgeon: Glendia Cordella Hutchinson, MD;  Location: [facility] OR;  Service: Orthopedics;  Laterality: Right;   POLYPECTOMY      Social History: Social History   Tobacco Use   Smoking status: Every Day    Current packs/day: 0.50    Types: Cigarettes    Passive exposure: Never   Smokeless tobacco: Never   Tobacco comments:    cutting back  Vaping Use   Vaping status: Never Used  Substance Use Topics   Alcohol use: Yes  Alcohol/week: 5.0 - 6.0 standard drinks of alcohol    Types: 5 - 6 Cans of beer per week    Comment: pt states daily beer drinker   Drug use: Not Currently    Types: Crack cocaine    Comment: years ago    Family History: Family History  Problem Relation Age of Onset   Heart failure Mother    Heart disease Mother    Heart failure Father    Arrhythmia Brother    Colon cancer Neg Hx    Esophageal cancer Neg Hx    Rectal cancer Neg Hx    Stomach cancer Neg Hx    Colon polyps Neg Hx    Crohn's disease Neg Hx    Ulcerative colitis Neg Hx     ROS:   Review  of Systems  Cardiovascular:  Negative for chest pain, claudication, irregular heartbeat, leg swelling, near-syncope, orthopnea, palpitations, paroxysmal nocturnal dyspnea and syncope.  Respiratory:  Negative for shortness of breath.   Hematologic/Lymphatic: Negative for bleeding problem.    Studies Reviewed:   EKG: EKG Interpretation Date/Time:  Wednesday September 02 2024 08:58:05 EST Ventricular Rate:  62 PR Interval:  138 QRS Duration:  154 QT Interval:  466 QTC Calculation: 472 R Axis:   64  Text Interpretation: Normal sinus rhythm Right bundle branch block When compared with ECG of 28-Dec-2017 18:02, Right bundle branch block , new Since last tracing Confirmed by Michele Richardson [phone]) on 09/02/2024 9:05:00 AM  Echocardiogram: 03/2017: LVEF 65 to 70%.    Stress Testing:  NA  RADIOLOGY: CTA chest abdomen pelvis for dissection protocol 06/04/2024 Aortic atherosclerosis See report for additional details   Labs:    Latest Ref Rng & Units 06/25/2024   10:58 AM 06/18/2024   11:55 AM 06/04/2024   10:42 AM  CBC  WBC 4.0 - 10.5 K/uL 4.9  6.0    Hemoglobin 13.0 - 17.0 g/dL 87.2  87.3  87.0   Hematocrit 39.0 - 52.0 % 38.7  38.0  38.0   Platelets 150.0 - 400.0 K/uL 316.0  344         Latest Ref Rng & Units 06/25/2024   10:58 AM 06/18/2024   11:55 AM 06/04/2024   10:42 AM  BMP  Glucose 70 - 99 mg/dL 898  97  888   BUN 6 - 23 mg/dL 15  14  13    Creatinine 0.40 - 1.50 mg/dL 9.32  9.35  9.39   BUN/Creat Ratio 10 - 24  22    Sodium 135 - 145 mEq/L 138  137  135   Potassium 3.5 - 5.1 mEq/L 4.4  3.7  3.1   Chloride 96 - 112 mEq/L 102  99  97   CO2 19 - 32 mEq/L 28  23    Calcium  8.4 - 10.5 mg/dL 9.0  8.9        Latest Ref Rng & Units 06/25/2024   10:58 AM 06/18/2024   11:55 AM 06/04/2024   10:42 AM  CMP  Glucose 70 - 99 mg/dL 898  97  888   BUN 6 - 23 mg/dL 15  14  13    Creatinine 0.40 - 1.50 mg/dL 9.32  9.35  9.39   Sodium 135 - 145 mEq/L 138  137  135   Potassium 3.5 -  5.1 mEq/L 4.4  3.7  3.1   Chloride 96 - 112 mEq/L 102  99  97   CO2 19 - 32 mEq/L 28  23  Calcium  8.4 - 10.5 mg/dL 9.0  8.9    Total Protein 6.0 - 8.3 g/dL 7.0  6.5    Total Bilirubin 0.2 - 1.2 mg/dL 0.7  0.6    Alkaline Phos 39 - 117 U/L 53  67    AST 0 - 37 U/L 19  16    ALT 0 - 53 U/L 16  17      Lab Results  Component Value Date   CHOL 164 01/11/2023   HDL 61 01/11/2023   LDLCALC 88 01/11/2023   TRIG 79 01/11/2023   CHOLHDL 2.7 01/11/2023   No results for input(s): LIPOA in the last 8760 hours. No components found for: NTPROBNP No results for input(s): PROBNP in the last 8760 hours. No results for input(s): TSH in the last 8760 hours.  Physical Exam:    Today's Vitals   09/02/24 0855  BP: 128/70  Pulse: 62  Resp: 17  SpO2: 96%  Weight: 170 lb (77.1 kg)  Height: 6' 1 (1.854 m)   Body mass index is 22.43 kg/m. Wt Readings from Last 3 Encounters:  09/02/24 170 lb (77.1 kg)  08/25/24 169 lb (76.7 kg)  08/11/24 171 lb (77.6 kg)    Physical Exam  Constitutional: No distress.  hemodynamically stable  Neck: No JVD present.  Cardiovascular: Normal rate, regular rhythm, S1 normal and S2 normal. Exam reveals no gallop, no S3 and no S4.  No murmur heard. Pulmonary/Chest: Effort normal and breath sounds normal. No stridor. He has no wheezes. He has no rales.  Musculoskeletal:        General: No edema.     Cervical back: Neck supple.  Skin: Skin is warm.     Impression & Recommendation(s):  Impression:   ICD-10-CM   1. Aortic atherosclerosis  I70.0 EKG 12-Lead    Comprehensive metabolic panel with GFR    Lipid panel    ECHOCARDIOGRAM COMPLETE    CT CARDIAC SCORING (SELF PAY ONLY)    2. Benign hypertension  I10     3. Cigarette smoker  F17.210 nicotine  (NICODERM CQ  - DOSED IN MG/24 HOURS) 14 mg/24hr patch    nicotine  polacrilex (NICORETTE ) 2 MG lozenge    4. Dyslipidemia, goal LDL below 70  E78.5 Comprehensive metabolic panel with GFR    Lipid  panel       Recommendation(s):  Aortic atherosclerosis Noted incidentally on imaging back in September 2025. Currently smoking 0.5 packs/day. Start Crestor  10 mg p.o. nightly. Fasting lipids and CMP in 6 weeks to reevaluate Lipids/AST/ALT Coronary calcium  score for further risk stratification from a cardiovascular standpoint given his risk factors Echo will be ordered to evaluate for structural heart disease and left ventricular systolic function. Will hold off on stress test at this time as he is asymptomatic.   However, if the coronary calcium  score is very high we will have to consider ischemic evaluation at that time, patient agreeable with the plan of care Emphasized the importance of improving his modifiable cardiovascular risk factors  Benign hypertension Office blood pressures are very well-controlled. Continue hydrochlorothiazide  25 mg p.o. daily  Cigarette smoker Tobacco cessation counseling: Currently smoking 0.5 packs/day   Patient denies claudication Recommend that he speak to his PCP regarding lung cancer screening CTs  Recommended that he speaks to his PCP for evaluating for AAA given his history of smoking He is informed of the dangers of tobacco abuse including stroke, cancer, and MI, as well as benefits of tobacco cessation. He is willing to  quit at this time. 5 mins were spent counseling patient cessation techniques. We discussed various methods to help quit smoking, including deciding on a date to quit, joining a support group, pharmacological agents- nicotine  gum/patch/lozenges.  Will prescribe him nicotine  patches as well as lozenges and reevaluate his progress at the next visit  Dyslipidemia, goal LDL below 70 Start Crestor  10 mg p.o. nightly.  See above   Orders Placed:  Orders Placed This Encounter  Procedures   CT CARDIAC SCORING (SELF PAY ONLY)    Standing Status:   Future    Expiration Date:   09/02/2025    [facility]    Radiology Contrast Protocol - do NOT remove file path:   [email or web address]   Comprehensive metabolic panel with GFR   Lipid panel   EKG 12-Lead   ECHOCARDIOGRAM COMPLETE    Standing Status:   Future    Expiration Date:   09/02/2025    Where should this test be performed:   [facility]    Does the patient weigh less than or greater than 250 lbs?:   Patient weighs less than 250 lbs    Perflutren DEFINITY (image enhancing agent) should be administered unless hypersensitivity or allergy exist:   Administer Perflutren    Reason for exam-Echo:   Other-Full Diagnosis List    Full ICD-10/Reason for Exam:   Aortic atherosclerosis [232769]     Final Medication List:    Meds ordered this encounter  Medications   nicotine  (NICODERM CQ  - DOSED IN MG/24 HOURS) 14 mg/24hr patch    Sig: Place 1 patch (14 mg total) onto the skin daily.    Dispense:  28 patch    Refill:  1   nicotine  polacrilex (NICORETTE ) 2 MG lozenge    Sig: Take 1 lozenge (2 mg total) by mouth as needed for smoking cessation.    Dispense:  100 tablet    Refill:  0   rosuvastatin  (CRESTOR ) 10 MG tablet    Sig: Take 1 tablet (10 mg total) by mouth at bedtime.    Dispense:  90 tablet    Refill:  3    There are no discontinued medications.   Current Outpatient Medications:    cyclobenzaprine  (FLEXERIL ) 5 MG tablet, Take 1 tablet (5 mg total) by mouth 2 (two) times daily as needed for muscle spasms., Disp: 21 tablet, Rfl: 0   Ferrous Sulfate  90 (18 Fe) MG TABS, Take 1 tablet by mouth daily., Disp: 30 tablet, Rfl: 0   hydrochlorothiazide  (HYDRODIURIL ) 25 MG tablet, Take 1 tablet (25 mg total) by mouth daily., Disp: 90 tablet, Rfl: 1   ibuprofen  (ADVIL ) 600 MG tablet, Take 1 tablet (600 mg total) by mouth every 8 (eight) hours as needed., Disp: 21 tablet, Rfl: 0   nicotine  (NICODERM CQ  - DOSED IN MG/24 HOURS) 14 mg/24hr patch, Place 1 patch (14 mg total) onto the  skin daily., Disp: 28 patch, Rfl: 1   nicotine  polacrilex (NICORETTE ) 2 MG lozenge, Take 1 lozenge (2 mg total) by mouth as needed for smoking cessation., Disp: 100 tablet, Rfl: 0   olopatadine  (PATANOL) 0.1 % ophthalmic solution, Place 1 drop into the left eye 2 (two) times daily., Disp: 5 mL, Rfl: 0   rosuvastatin  (CRESTOR ) 10 MG tablet, Take 1 tablet (10 mg total) by mouth at bedtime., Disp: 90 tablet, Rfl: 3  Consent:   N/A  Disposition:   1 year follow-up sooner if needed  Patient may be asked to follow-up sooner based on the results of the above-mentioned testing.  Signed, Madonna Michele HAS, [facility]  [facility]  A Division of [facility] [address] [place], [place] [address]

## 2024-09-02 NOTE — Telephone Encounter (Signed)
 Patient called back and was supposed to be a 2 day prep, he did not drink his miralax and he ate chicken today. I rescheduled the patient for 10/08/24 and made him a PV appointment for 10/02/24

## 2024-09-02 NOTE — Addendum Note (Signed)
 Addended by: CHESLEY BOUCHARD T on: 09/02/2024 04:35 PM   Modules accepted: Orders

## 2024-09-03 ENCOUNTER — Encounter: Admitting: Gastroenterology

## 2024-09-05 ENCOUNTER — Encounter: Payer: Self-pay | Admitting: Cardiology

## 2024-10-02 ENCOUNTER — Ambulatory Visit

## 2024-10-02 ENCOUNTER — Encounter: Payer: Self-pay | Admitting: Gastroenterology

## 2024-10-02 VITALS — Ht 73.0 in | Wt 165.2 lb

## 2024-10-02 DIAGNOSIS — Z860100 Personal history of colon polyps, unspecified: Secondary | ICD-10-CM

## 2024-10-02 MED ORDER — PEG 3350-KCL-NA BICARB-NACL 420 G PO SOLR: 4000.0000 mL | Freq: Once | ORAL | 0 refills | Status: AC

## 2024-10-02 NOTE — Progress Notes (Signed)
" °  PREP INSTRUCTIONS REVIEWED THOROUGHLY WITH PATIENT, ALL QUESTIONS ANSWERED. PATIENT VERBALIZES UNDERSTANDING ON PREP INSTRUCTIONS.  No egg or soy allergy known to patient  No issues known to pt with past sedation with any surgeries or procedures Patient denies ever being told they had issues or difficulty with intubation  No FH of Malignant Hyperthermia Pt is not on diet pills Pt is not on  home 02  Pt is not on blood thinners  Pt denies issues with constipation  No A fib or A flutter Have any cardiac testing pending--NO Pt can ambulate-INDEPENDENTLY  Pt denies use of chewing tobacco Discussed diabetic I weight loss medication holds Discussed NSAID holds Checked BMI Pt instructed to use Single[employer] or GoodRx for a price reduction on prep  Patient's chart reviewed by Norleen Schillings CNRA prior to previsit and patient appropriate for the [facility].  Pre visit completed and red dot placed by patient's name on their procedure day (on provider's schedule).    "

## 2024-10-08 ENCOUNTER — Ambulatory Visit: Admitting: Gastroenterology

## 2024-10-08 ENCOUNTER — Encounter: Payer: Self-pay | Admitting: Gastroenterology

## 2024-10-08 VITALS — BP 142/84 | HR 47 | Temp 97.3°F | Resp 21 | Ht 73.0 in | Wt 165.2 lb

## 2024-10-08 DIAGNOSIS — K648 Other hemorrhoids: Secondary | ICD-10-CM | POA: Diagnosis not present

## 2024-10-08 DIAGNOSIS — Z860101 Personal history of adenomatous and serrated colon polyps: Secondary | ICD-10-CM | POA: Diagnosis not present

## 2024-10-08 DIAGNOSIS — Z860100 Personal history of colon polyps, unspecified: Secondary | ICD-10-CM

## 2024-10-08 DIAGNOSIS — K621 Rectal polyp: Secondary | ICD-10-CM

## 2024-10-08 DIAGNOSIS — Z1211 Encounter for screening for malignant neoplasm of colon: Secondary | ICD-10-CM

## 2024-10-08 DIAGNOSIS — D128 Benign neoplasm of rectum: Secondary | ICD-10-CM

## 2024-10-08 MED ORDER — SODIUM CHLORIDE 0.9 % IV SOLN: 500.0000 mL | Freq: Once | INTRAVENOUS | Status: DC

## 2024-10-08 NOTE — Progress Notes (Signed)
 History and Physical:  This patient presents for endoscopic testing for: Encounter Diagnosis  Name Primary?   Hx of colonic polyps Yes    69 year old man here today for surveillance colonoscopy with a history of colon polyps. Diminutive tubular adenoma with poor bowel preparation October 2023 2 diminutive tubular adenomas September 2018  Patient denies chronic abdominal pain, rectal bleeding, constipation or diarrhea.   Patient is otherwise without complaints or active issues today.   Past Medical History: Past Medical History:  Diagnosis Date   Chronic hepatitis C virus infection (HCC)    under treatment June 2016   H/O ETOH abuse    Hyperlipidemia    Hypertension    Smoker    1/2 ppd   Substance abuse (HCC)      Past Surgical History: Past Surgical History:  Procedure Laterality Date   COLONOSCOPY     KNEE ARTHROSCOPY     KNEE ARTHROSCOPY WITH MENISCAL REPAIR Right 06/26/2016   Procedure: KNEE ARTHROSCOPY WITH MENISCAL REPAIR VS RESECTION;  Surgeon: Glendia Cordella Hutchinson, MD;  Location: [facility] OR;  Service: Orthopedics;  Laterality: Right;   PATELLAR TENDON REPAIR Right 06/26/2016   Procedure: PATELLA TENDON REPAIR;  Surgeon: Glendia Cordella Hutchinson, MD;  Location: [facility] OR;  Service: Orthopedics;  Laterality: Right;   POLYPECTOMY      Allergies: Allergies[1]  Outpatient Meds: Current Outpatient Medications  Medication Sig Dispense Refill   Ferrous Sulfate  90 (18 Fe) MG TABS Take 1 tablet by mouth daily. 30 tablet 0   hydrochlorothiazide  (HYDRODIURIL ) 25 MG tablet Take 1 tablet (25 mg total) by mouth daily. 90 tablet 1   rosuvastatin  (CRESTOR ) 10 MG tablet Take 1 tablet (10 mg total) by mouth at bedtime. 90 tablet 3   cyclobenzaprine  (FLEXERIL ) 5 MG tablet Take 1 tablet (5 mg total) by mouth 2 (two) times daily as needed for muscle spasms. 21 tablet 0   ibuprofen  (ADVIL ) 600 MG tablet Take 1 tablet (600 mg total) by mouth every 8 (eight) hours as needed. 21 tablet 0    nicotine  (NICODERM CQ  - DOSED IN MG/24 HOURS) 14 mg/24hr patch Place 1 patch (14 mg total) onto the skin daily. (Patient not taking: Reported on 10/02/2024) 28 patch 1   nicotine  polacrilex (NICORETTE ) 2 MG lozenge Take 1 lozenge (2 mg total) by mouth as needed for smoking cessation. (Patient not taking: Reported on 10/08/2024) 100 tablet 0   olopatadine  (PATANOL) 0.1 % ophthalmic solution Place 1 drop into the left eye 2 (two) times daily. 5 mL 0   Current Facility-Administered Medications  Medication Dose Route Frequency Provider Last Rate Last Admin   0.9 %  sodium chloride  infusion  500 mL Intravenous Once Danis, Victory CROME III, MD          ___________________________________________________________________ Objective   Exam:  BP (!) 154/81   Pulse (!) 48   Temp (!) 97.3 F (36.3 C) (Temporal)   Resp 19   Ht 6' 1 (1.854 m)   Wt 165 lb 3.2 oz (74.9 kg)   SpO2 100%   BMI 21.80 kg/m   CV: regular , S1/S2 Resp: clear to auscultation bilaterally, normal RR and effort noted GI: soft, no tenderness, with active bowel sounds.   Assessment: Encounter Diagnosis  Name Primary?   Hx of colonic polyps Yes     Plan: Colonoscopy   The benefits and risks of the planned procedure(s) were described in detail with the patient or (when appropriate) their health care proxy.  Risks were outlined  as including, but not limited to, bleeding, infection, perforation, adverse medication reaction leading to cardiac or pulmonary decompensation, pancreatitis (if ERCP).  The limitation of incomplete mucosal visualization was also discussed.  No guarantees or warranties were given.  The patient was provided an opportunity to ask questions and all were answered. The patient agreed with the plan.   The patient is appropriate for an endoscopic procedure in the ambulatory setting.   - Victory Brand, MD        [1]  Allergies Allergen Reactions   Penicillins Hives    Childhood allergy Has patient  had a PCN reaction causing immediate rash, facial/tongue/throat swelling, SOB or lightheadedness with hypotension: No Has patient had a PCN reaction causing severe rash involving mucus membranes or skin necrosis: No Has patient had a PCN reaction that required hospitalization No Has patient had a PCN reaction occurring within the last 10 years: No If all of the above answers are NO, then may proceed with Cephalosporin use.

## 2024-10-08 NOTE — Progress Notes (Signed)
 Called to room to assist during endoscopic procedure.  Patient ID and intended procedure confirmed with present staff. Received instructions for my participation in the procedure from the performing physician.

## 2024-10-08 NOTE — Progress Notes (Signed)
 Pt's states no medical or surgical changes since previsit or office visit.

## 2024-10-08 NOTE — Patient Instructions (Addendum)
 Continue present medications. Await pathology results. Repeat colonoscopy is recommended for surveillance. The colonoscopy date will be determined after pathology results from today's  exam become available for review. (If either polyp is TA, recall 5 years. If neither, recall 7 years)  Please read over handouts   YOU HAD AN ENDOSCOPIC PROCEDURE TODAY AT THE [facility]:   Refer to the procedure report that was given to you for any specific questions about what was found during the examination.  If the procedure report does not answer your questions, please call your gastroenterologist to clarify.  If you requested that your care partner not be given the details of your procedure findings, then the procedure report has been included in a sealed envelope for you to review at your convenience later.  YOU SHOULD EXPECT: Some feelings of bloating in the abdomen. Passage of more gas than usual.  Walking can help get rid of the air that was put into your GI tract during the procedure and reduce the bloating. If you had a lower endoscopy (such as a colonoscopy or flexible sigmoidoscopy) you may notice spotting of blood in your stool or on the toilet paper. If you underwent a bowel prep for your procedure, you may not have a normal bowel movement for a few days.  Please Note:  You might notice some irritation and congestion in your nose or some drainage.  This is from the oxygen used during your procedure.  There is no need for concern and it should clear up in a day or so.  SYMPTOMS TO REPORT IMMEDIATELY:  Following lower endoscopy (colonoscopy or flexible sigmoidoscopy):  Excessive amounts of blood in the stool  Significant tenderness or worsening of abdominal pains  Swelling of the abdomen that is new, acute  Fever of 100F or higher  For urgent or emergent issues, a gastroenterologist can be reached at any hour by calling [phone]. Do not use MyChart messaging for urgent  concerns.    DIET:  We do recommend a small meal at first, but then you may proceed to your regular diet.  Drink plenty of fluids but you should avoid alcoholic beverages for 24 hours.  ACTIVITY:  You should plan to take it easy for the rest of today and you should NOT DRIVE or use heavy machinery until tomorrow (because of the sedation medicines used during the test).    FOLLOW UP: Our staff will call the number listed on your records the next business day following your procedure.  We will call around 7:15- 8:00 am to check on you and address any questions or concerns that you may have regarding the information given to you following your procedure. If we do not reach you, we will leave a message.     If any biopsies were taken you will be contacted by phone or by letter within the next 1-3 weeks.  Please call us  at [phone] if you have not heard about the biopsies in 3 weeks.    SIGNATURES/CONFIDENTIALITY: You and/or your care partner have signed paperwork which will be entered into your electronic medical record.  These signatures attest to the fact that that the information above on your After Visit Summary has been reviewed and is understood.  Full responsibility of the confidentiality of this discharge information lies with you and/or your care-partner.

## 2024-10-08 NOTE — Progress Notes (Signed)
 Report given to PACU, vss

## 2024-10-08 NOTE — Op Note (Signed)
 [facility] Patient Name: Roy Martinez Procedure Date: 10/08/2024 1:12 PM MRN: [id number] Endoscopist: Victory L. Legrand , MD, [id number] Age: 69 Referring MD:  Date of Birth: [birth year] Gender: Male Account #: [id number] Procedure:                Colonoscopy Indications:              Personal history of colonic polyps                           Diminutive tubular adenoma September 2018                           Diminutive tubular adenoma x 25 June 2022 (poor                            prep) Medicines:                Monitored Anesthesia Care Procedure:                Pre-Anesthesia Assessment:                           - Prior to the procedure, a History and Physical                            was performed, and patient medications and                            allergies were reviewed. The patient's tolerance of                            previous anesthesia was also reviewed. The risks                            and benefits of the procedure and the sedation                            options and risks were discussed with the patient.                            All questions were answered, and informed consent                            was obtained. Prior Anticoagulants: The patient has                            taken no anticoagulant or antiplatelet agents. ASA                            Grade Assessment: II - A patient with mild systemic                            disease. After reviewing the risks and benefits,  the patient was deemed in satisfactory condition to                            undergo the procedure.                           After obtaining informed consent, the colonoscope                            was passed under direct vision. Throughout the                            procedure, the patient's blood pressure, pulse, and                            oxygen saturations were monitored continuously. The                             Colonoscope was introduced through the anus and                            advanced to the the cecum, identified by                            appendiceal orifice and ileocecal valve. The                            colonoscopy was performed without difficulty. The                            patient tolerated the procedure well. The quality                            of the bowel preparation was good. The ileocecal                            valve, appendiceal orifice, and rectum were                            photographed. The bowel preparation used was 2 day                            Suprep/Miralax. Scope In: 1:28:11 PM Scope Out: 1:43:57 PM Scope Withdrawal Time: 0 hours 11 minutes 14 seconds  Total Procedure Duration: 0 hours 15 minutes 46 seconds  Findings:                 The perianal and digital rectal examinations were                            normal.                           Repeat examination of right colon under NBI  performed.                           Two sessile polyps were found in the rectum. The                            polyps were diminutive in size. These polyps were                            removed with a cold snare. Resection and retrieval                            were complete.                           Internal hemorrhoids were found. The hemorrhoids                            were small.                           The exam was otherwise without abnormality on                            direct and retroflexion views. Complications:            No immediate complications. Estimated Blood Loss:     Estimated blood loss was minimal. Impression:               - Two diminutive polyps in the rectum, removed with                            a cold snare. Resected and retrieved.                           - Internal hemorrhoids.                           - The examination was otherwise normal on direct                            and  retroflexion views. Recommendation:           - Patient has a contact number available for                            emergencies. The signs and symptoms of potential                            delayed complications were discussed with the                            patient. Return to normal activities tomorrow.                            Written discharge instructions were provided to the  patient.                           - Resume previous diet.                           - Continue present medications.                           - Await pathology results.                           - Repeat colonoscopy is recommended for                            surveillance. The colonoscopy date will be                            determined after pathology results from today's                            exam become available for review. (If either polyp                            is TA, recall 5 years. If neither, recall 7 years) Victory L. Legrand, MD 10/08/2024 1:48:56 PM This report has been signed electronically.

## 2024-10-09 ENCOUNTER — Telehealth: Payer: Self-pay | Admitting: Lactation Services

## 2024-10-09 NOTE — Telephone Encounter (Signed)
 No answer left voice mail

## 2024-10-14 ENCOUNTER — Ambulatory Visit (HOSPITAL_COMMUNITY)
Admission: RE | Admit: 2024-10-14 | Discharge: 2024-10-14 | Disposition: A | Discharge status: Home / Self Care | Source: Ambulatory Visit | Attending: Cardiology | Admitting: Cardiology

## 2024-10-14 ENCOUNTER — Ambulatory Visit (HOSPITAL_COMMUNITY)
Admission: RE | Admit: 2024-10-14 | Discharge: 2024-10-14 | Disposition: A | Discharge status: Home / Self Care | Payer: Self-pay | Source: Ambulatory Visit | Attending: Cardiology | Admitting: Cardiology

## 2024-10-14 ENCOUNTER — Ambulatory Visit: Payer: Self-pay | Admitting: Gastroenterology

## 2024-10-14 ENCOUNTER — Ambulatory Visit: Payer: Self-pay | Admitting: Cardiology

## 2024-10-14 DIAGNOSIS — I700 Atherosclerosis of aorta: Secondary | ICD-10-CM | POA: Diagnosis not present

## 2024-10-14 LAB — ECHOCARDIOGRAM COMPLETE
Area-P 1/2: 3.08 cm2
S' Lateral: 2.9 cm

## 2024-10-14 LAB — SURGICAL PATHOLOGY

## 2024-10-15 ENCOUNTER — Telehealth: Payer: Self-pay | Admitting: Cardiology

## 2024-10-15 NOTE — Telephone Encounter (Signed)
 Pt would like a c/b regarding recent results, please advise.

## 2024-10-15 NOTE — Telephone Encounter (Signed)
 Spoke with pt. Gave Ca Score results. Echo still pending. Pt stated understanding.

## 2024-10-21 ENCOUNTER — Telehealth: Payer: Self-pay | Admitting: *Deleted

## 2024-10-21 NOTE — Telephone Encounter (Signed)
 3 month reminder to discuss a f/u MRI with contrast. LVM requesting return call from patient to discuss scheduling.

## 2024-10-21 NOTE — Telephone Encounter (Signed)
 Calling patient to discuss scheduling MRI w/ contrast .  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 LVM for patient to return call.

## 2024-10-23 NOTE — Telephone Encounter (Signed)
 2nd attempt to reach patient. LVM requesting return call.
